# Patient Record
Sex: Female | Born: 1950
Health system: Southern US, Community
[De-identification: ages and names within clinical notes are randomized; demographics above are authoritative.]

## PROBLEM LIST (undated history)

## (undated) DIAGNOSIS — G43909 Migraine, unspecified, not intractable, without status migrainosus: Secondary | ICD-10-CM

## (undated) DIAGNOSIS — N2581 Secondary hyperparathyroidism of renal origin: Secondary | ICD-10-CM

## (undated) DIAGNOSIS — E785 Hyperlipidemia, unspecified: Secondary | ICD-10-CM

## (undated) DIAGNOSIS — J301 Allergic rhinitis due to pollen: Secondary | ICD-10-CM

## (undated) DIAGNOSIS — I639 Cerebral infarction, unspecified: Secondary | ICD-10-CM

## (undated) DIAGNOSIS — K580 Irritable bowel syndrome with diarrhea: Secondary | ICD-10-CM

## (undated) DIAGNOSIS — Q2112 Patent foramen ovale: Secondary | ICD-10-CM

## (undated) DIAGNOSIS — E1149 Type 2 diabetes mellitus with other diabetic neurological complication: Secondary | ICD-10-CM

## (undated) DIAGNOSIS — D509 Iron deficiency anemia, unspecified: Secondary | ICD-10-CM

## (undated) DIAGNOSIS — M797 Fibromyalgia: Secondary | ICD-10-CM

## (undated) DIAGNOSIS — K219 Gastro-esophageal reflux disease without esophagitis: Secondary | ICD-10-CM

## (undated) DIAGNOSIS — F32A Depression, unspecified: Secondary | ICD-10-CM

## (undated) DIAGNOSIS — N189 Chronic kidney disease, unspecified: Secondary | ICD-10-CM

## (undated) DIAGNOSIS — G2581 Restless legs syndrome: Secondary | ICD-10-CM

## (undated) DIAGNOSIS — H4720 Unspecified optic atrophy: Secondary | ICD-10-CM

## (undated) DIAGNOSIS — K589 Irritable bowel syndrome without diarrhea: Secondary | ICD-10-CM

## (undated) DIAGNOSIS — F39 Unspecified mood [affective] disorder: Secondary | ICD-10-CM

## (undated) DIAGNOSIS — H3530 Unspecified macular degeneration: Secondary | ICD-10-CM

## (undated) DIAGNOSIS — I10 Essential (primary) hypertension: Secondary | ICD-10-CM

## (undated) DIAGNOSIS — I69351 Hemiplegia and hemiparesis following cerebral infarction affecting right dominant side: Secondary | ICD-10-CM

## (undated) DIAGNOSIS — M1990 Unspecified osteoarthritis, unspecified site: Secondary | ICD-10-CM

## (undated) DIAGNOSIS — G479 Sleep disorder, unspecified: Secondary | ICD-10-CM

## (undated) DIAGNOSIS — K449 Diaphragmatic hernia without obstruction or gangrene: Secondary | ICD-10-CM

## (undated) DIAGNOSIS — H25019 Cortical age-related cataract, unspecified eye: Secondary | ICD-10-CM

## (undated) DIAGNOSIS — Q211 Atrial septal defect: Secondary | ICD-10-CM

## (undated) DIAGNOSIS — J189 Pneumonia, unspecified organism: Secondary | ICD-10-CM

## (undated) DIAGNOSIS — G4733 Obstructive sleep apnea (adult) (pediatric): Secondary | ICD-10-CM

## (undated) DIAGNOSIS — N186 End stage renal disease: Secondary | ICD-10-CM

## (undated) DIAGNOSIS — K635 Polyp of colon: Secondary | ICD-10-CM

## (undated) DIAGNOSIS — E538 Deficiency of other specified B group vitamins: Secondary | ICD-10-CM

## (undated) DIAGNOSIS — M810 Age-related osteoporosis without current pathological fracture: Secondary | ICD-10-CM

## (undated) HISTORY — PX: TUBAL LIGATION: SHX77

## (undated) HISTORY — DX: Sleep disorder, unspecified: G47.9

## (undated) HISTORY — DX: Allergic rhinitis due to pollen: J30.1

## (undated) HISTORY — DX: Migraine, unspecified, not intractable, without status migrainosus: G43.909

## (undated) HISTORY — DX: Deficiency of other specified B group vitamins: E53.8

## (undated) HISTORY — PX: CHOLECYSTECTOMY: SHX55

## (undated) HISTORY — DX: Unspecified mood (affective) disorder: F39

## (undated) HISTORY — PX: TONSILLECTOMY: SUR1361

## (undated) HISTORY — DX: Irritable bowel syndrome, unspecified: K58.9

## (undated) HISTORY — PX: COLONOSCOPY: SHX174

## (undated) HISTORY — DX: Type 2 diabetes mellitus with other diabetic neurological complication: E11.49

## (undated) HISTORY — DX: Hemiplegia and hemiparesis following cerebral infarction affecting right dominant side: I69.351

## (undated) HISTORY — DX: Patent foramen ovale: Q21.12

## (undated) HISTORY — DX: Restless legs syndrome: G25.81

## (undated) HISTORY — DX: Gastro-esophageal reflux disease without esophagitis: K21.9

## (undated) HISTORY — PX: HERNIA REPAIR: SHX51

## (undated) HISTORY — PX: CATARACT EXTRACTION W/ INTRAOCULAR LENS  IMPLANT, BILATERAL: SHX1307

## (undated) HISTORY — DX: Obstructive sleep apnea (adult) (pediatric): G47.33

## (undated) HISTORY — PX: UPPER GASTROINTESTINAL ENDOSCOPY: SHX188

## (undated) HISTORY — PX: TONSILLECTOMY AND ADENOIDECTOMY: SHX28

## (undated) HISTORY — DX: Fibromyalgia: M79.7

## (undated) HISTORY — PX: OTHER SURGICAL HISTORY: SHX169

## (undated) HISTORY — DX: Iron deficiency anemia, unspecified: D50.9

## (undated) HISTORY — DX: Unspecified optic atrophy: H47.20

## (undated) HISTORY — DX: Atrial septal defect: Q21.1

## (undated) HISTORY — DX: Essential (primary) hypertension: I10

---

## 1988-10-26 HISTORY — PX: HIATAL HERNIA REPAIR: SHX195

## 2004-08-07 ENCOUNTER — Ambulatory Visit: Payer: Self-pay | Admitting: Urology

## 2004-10-05 ENCOUNTER — Inpatient Hospital Stay: Payer: Self-pay | Admitting: Surgery

## 2004-10-05 ENCOUNTER — Other Ambulatory Visit: Payer: Self-pay

## 2004-10-14 ENCOUNTER — Ambulatory Visit: Payer: Self-pay | Admitting: Unknown Physician Specialty

## 2005-02-16 ENCOUNTER — Ambulatory Visit: Payer: Self-pay

## 2005-05-18 ENCOUNTER — Ambulatory Visit: Payer: Self-pay

## 2005-07-10 ENCOUNTER — Ambulatory Visit: Payer: Self-pay | Admitting: Urology

## 2006-09-27 ENCOUNTER — Encounter (INDEPENDENT_AMBULATORY_CARE_PROVIDER_SITE_OTHER): Payer: Self-pay | Admitting: Gynecology

## 2006-09-27 ENCOUNTER — Ambulatory Visit: Payer: Self-pay | Admitting: Gynecology

## 2007-11-07 ENCOUNTER — Ambulatory Visit: Payer: Self-pay | Admitting: Gynecology

## 2007-11-11 ENCOUNTER — Ambulatory Visit: Payer: Self-pay | Admitting: Urology

## 2007-11-14 ENCOUNTER — Encounter: Admission: RE | Admit: 2007-11-14 | Discharge: 2007-11-14 | Payer: Self-pay | Admitting: Gynecology

## 2007-12-13 ENCOUNTER — Ambulatory Visit: Payer: Self-pay | Admitting: Urology

## 2007-12-13 ENCOUNTER — Other Ambulatory Visit: Payer: Self-pay

## 2007-12-19 ENCOUNTER — Ambulatory Visit: Payer: Self-pay | Admitting: Urology

## 2008-05-18 ENCOUNTER — Ambulatory Visit: Payer: Self-pay | Admitting: Gastroenterology

## 2008-06-27 ENCOUNTER — Ambulatory Visit: Payer: Self-pay | Admitting: Gastroenterology

## 2008-08-14 ENCOUNTER — Encounter: Payer: Self-pay | Admitting: Gastroenterology

## 2008-08-21 ENCOUNTER — Encounter: Payer: Self-pay | Admitting: Gastroenterology

## 2008-08-21 DIAGNOSIS — R933 Abnormal findings on diagnostic imaging of other parts of digestive tract: Secondary | ICD-10-CM

## 2008-08-22 ENCOUNTER — Encounter: Payer: Self-pay | Admitting: Gastroenterology

## 2008-08-24 ENCOUNTER — Telehealth: Payer: Self-pay | Admitting: Internal Medicine

## 2008-08-27 ENCOUNTER — Telehealth: Payer: Self-pay | Admitting: Gastroenterology

## 2008-08-28 ENCOUNTER — Ambulatory Visit: Payer: Self-pay | Admitting: Gastroenterology

## 2008-09-03 ENCOUNTER — Ambulatory Visit: Payer: Self-pay | Admitting: Gastroenterology

## 2008-09-03 ENCOUNTER — Ambulatory Visit (HOSPITAL_COMMUNITY): Admission: RE | Admit: 2008-09-03 | Discharge: 2008-09-03 | Payer: Self-pay | Admitting: Gastroenterology

## 2008-09-04 ENCOUNTER — Telehealth: Payer: Self-pay | Admitting: Gastroenterology

## 2008-10-01 ENCOUNTER — Telehealth: Payer: Self-pay | Admitting: Gastroenterology

## 2008-10-04 ENCOUNTER — Telehealth: Payer: Self-pay | Admitting: Gastroenterology

## 2008-10-05 ENCOUNTER — Encounter: Payer: Self-pay | Admitting: Gastroenterology

## 2008-10-05 ENCOUNTER — Telehealth: Payer: Self-pay | Admitting: Gastroenterology

## 2008-10-05 ENCOUNTER — Ambulatory Visit: Payer: Self-pay

## 2008-10-08 ENCOUNTER — Ambulatory Visit: Payer: Self-pay

## 2008-10-08 ENCOUNTER — Ambulatory Visit: Payer: Self-pay | Admitting: Radiology

## 2008-10-16 ENCOUNTER — Encounter (INDEPENDENT_AMBULATORY_CARE_PROVIDER_SITE_OTHER): Payer: Self-pay | Admitting: Neurology

## 2008-10-16 ENCOUNTER — Ambulatory Visit: Payer: Self-pay

## 2009-01-17 ENCOUNTER — Ambulatory Visit: Payer: Self-pay | Admitting: Surgery

## 2009-01-22 ENCOUNTER — Inpatient Hospital Stay: Payer: Self-pay | Admitting: Surgery

## 2009-02-06 ENCOUNTER — Ambulatory Visit: Payer: Self-pay

## 2009-02-07 ENCOUNTER — Observation Stay: Payer: Self-pay | Admitting: Surgery

## 2009-06-03 ENCOUNTER — Encounter: Admission: RE | Admit: 2009-06-03 | Discharge: 2009-06-03 | Payer: Self-pay | Admitting: Neurology

## 2009-08-13 ENCOUNTER — Ambulatory Visit (HOSPITAL_COMMUNITY): Admission: RE | Admit: 2009-08-13 | Discharge: 2009-08-13 | Payer: Self-pay | Admitting: Neurology

## 2010-04-18 ENCOUNTER — Ambulatory Visit: Payer: Self-pay

## 2011-03-10 NOTE — Assessment & Plan Note (Signed)
NAME:  Anita Greene, Anita Greene NO.:  [id number]   MEDICAL RECORD NO.:  [id number]          PATIENT TYPE:  POB   LOCATION:  [place] [facility] [place]:  [facility]   PHYSICIAN:  Emily Filbert, MD        DATE OF BIRTH:  06/23/1951   DATE OF SERVICE:                                  CLINIC NOTE   IDENTIFICATION:  Anita Greene is a 60 year old married white female who  comes in for her annual exam. She has no GYN complaints today. She is  requesting refills on her medical doctor problems and pills for  sleeping.   PAST MEDICAL HISTORY:  Morbid obesity, hematuria, IBS, hypertension,  adult onset diabetes, reflux, anxiety/depression and mixed urinary  incontinence.   REVIEW OF SYSTEMS:  She sees Dr. Eliberto Ivory, a urologist in [place] and has  a cystoscopy scheduled November 22, 2007 as well as a CT scan for follow-  up of a adrenal mass.  She is married for 3 years to her second  husband. Again Pap smear was negative in 09/2006. Her mammogram is due  and her colonoscopy was done  38-year  of age.   PAST SURGICAL HISTORY:  Tubal ligation, cholecystectomy, bilateral  cataracts and tonsillectomy.   She has not latex  allergies.   ALLERGIES:  ERYTHROMYCIN, SPECTRACEPT AND BIAXIN.   SOCIAL HISTORY:  She reports rare alcohol.  Denies tobacco or illegal  drug use.   FAMILY HISTORY:  Significant for breast cancer in a paternal aunt,  bladder cancer and prostate cancer in her father, lung cancer in her  mother and sister but no GYN or colon malignancies.   PHYSICAL EXAM:  Weight 200 pounds, blood pressure 147/101, pulse 98.  HEENT:  Normal.  HEART:  Regular rate rhythm.  LUNGS:  Clear to auscultation bilaterally.  BREAST EXAM: No masses.  Normal skin and no nipple discharge.  ABDOMEN:  Benign and obese.  EXTERNAL GENITALIA:  Normal.  Mild atrophy.  Cervix: No lesions.  Bimanual exam:  Normal size and shape, anteverted uterus, nonenlarged  adnexa.   ASSESSMENT/PLAN:  1.  Annual exam.  Recommended weight loss and I checked a Pap smear and      recommended self-breast exam and self vulvar exams monthly.  2. With regard to her medicines that she needs refilled, I suggested      that the doctor that takes care of  these conditions refill those      prescriptions as well as sleeping pills.      Emily Filbert, MD     MCD/MEDQ  D:  11/07/2007  T:  11/08/2007  Job:  [id number]

## 2011-04-26 ENCOUNTER — Ambulatory Visit: Payer: Self-pay | Admitting: Internal Medicine

## 2011-05-20 ENCOUNTER — Ambulatory Visit: Payer: Self-pay | Admitting: Internal Medicine

## 2011-05-27 ENCOUNTER — Ambulatory Visit: Payer: Self-pay | Admitting: Internal Medicine

## 2011-06-02 ENCOUNTER — Ambulatory Visit: Payer: Self-pay

## 2011-06-27 ENCOUNTER — Ambulatory Visit: Payer: Self-pay | Admitting: Internal Medicine

## 2011-07-14 ENCOUNTER — Ambulatory Visit: Payer: Self-pay | Admitting: Gastroenterology

## 2011-07-27 ENCOUNTER — Ambulatory Visit: Payer: Self-pay | Admitting: Internal Medicine

## 2011-07-27 LAB — HM COLONOSCOPY

## 2011-07-29 ENCOUNTER — Ambulatory Visit: Payer: Self-pay | Admitting: Gastroenterology

## 2011-08-26 LAB — CA 125: CA 125: 9.4 U/mL (ref 0.0–34.0)

## 2011-08-27 ENCOUNTER — Ambulatory Visit: Payer: Self-pay | Admitting: Internal Medicine

## 2011-08-31 ENCOUNTER — Ambulatory Visit: Payer: Self-pay | Admitting: Unknown Physician Specialty

## 2011-09-26 ENCOUNTER — Ambulatory Visit: Payer: Self-pay | Admitting: Internal Medicine

## 2011-10-27 ENCOUNTER — Ambulatory Visit: Payer: Self-pay | Admitting: Internal Medicine

## 2011-12-29 ENCOUNTER — Ambulatory Visit: Payer: Self-pay | Admitting: Internal Medicine

## 2011-12-31 ENCOUNTER — Ambulatory Visit: Payer: Self-pay | Admitting: Gynecologic Oncology

## 2012-01-25 ENCOUNTER — Ambulatory Visit: Payer: Self-pay | Admitting: Internal Medicine

## 2012-02-24 ENCOUNTER — Ambulatory Visit: Payer: Self-pay | Admitting: Internal Medicine

## 2012-07-13 ENCOUNTER — Ambulatory Visit: Payer: Self-pay

## 2012-07-19 LAB — HEMOGLOBIN A1C: Hemoglobin A1C: 7.1

## 2012-07-19 LAB — BASIC METABOLIC PANEL: GLUCOSE: 137 mg/dL

## 2012-07-19 LAB — LIPID PANEL
CHOLESTEROL: 152 mg/dL (ref 0–200)
HDL: 51 mg/dL (ref 35–70)
LDL Cholesterol: 71 mg/dL

## 2013-07-28 ENCOUNTER — Ambulatory Visit: Payer: Self-pay | Admitting: Internal Medicine

## 2013-08-21 LAB — CANCER CENTER HEMOGLOBIN: HGB: 10.3 g/dL — ABNORMAL LOW (ref 12.0–16.0)

## 2013-08-26 ENCOUNTER — Ambulatory Visit: Payer: Self-pay | Admitting: Internal Medicine

## 2014-02-06 ENCOUNTER — Ambulatory Visit: Payer: Self-pay | Admitting: Internal Medicine

## 2014-02-23 ENCOUNTER — Ambulatory Visit: Payer: Self-pay | Admitting: Internal Medicine

## 2014-02-26 LAB — OCCULT BLOOD X 1 CARD TO LAB, STOOL
OCCULT BLOOD, FECES: NEGATIVE
Occult Blood, Feces: NEGATIVE
Occult Blood, Feces: NEGATIVE

## 2014-03-26 ENCOUNTER — Ambulatory Visit: Payer: Self-pay | Admitting: Oncology

## 2014-03-26 ENCOUNTER — Ambulatory Visit: Payer: Self-pay | Admitting: Internal Medicine

## 2014-09-18 ENCOUNTER — Ambulatory Visit: Payer: Self-pay | Admitting: Gastroenterology

## 2014-09-18 DIAGNOSIS — K580 Irritable bowel syndrome with diarrhea: Secondary | ICD-10-CM | POA: Insufficient documentation

## 2014-09-18 LAB — CLOSTRIDIUM DIFFICILE(ARMC)

## 2014-09-20 LAB — STOOL CULTURE

## 2016-01-01 ENCOUNTER — Encounter: Payer: Self-pay | Admitting: Internal Medicine

## 2016-01-01 ENCOUNTER — Ambulatory Visit (INDEPENDENT_AMBULATORY_CARE_PROVIDER_SITE_OTHER): Payer: BLUE CROSS/BLUE SHIELD | Admitting: Internal Medicine

## 2016-01-01 ENCOUNTER — Telehealth: Payer: Self-pay

## 2016-01-01 VITALS — BP 128/80 | HR 96 | Temp 98.4°F | Ht 62.0 in | Wt 185.5 lb

## 2016-01-01 DIAGNOSIS — I69351 Hemiplegia and hemiparesis following cerebral infarction affecting right dominant side: Secondary | ICD-10-CM | POA: Diagnosis not present

## 2016-01-01 DIAGNOSIS — K219 Gastro-esophageal reflux disease without esophagitis: Secondary | ICD-10-CM | POA: Insufficient documentation

## 2016-01-01 DIAGNOSIS — F39 Unspecified mood [affective] disorder: Secondary | ICD-10-CM

## 2016-01-01 DIAGNOSIS — G43909 Migraine, unspecified, not intractable, without status migrainosus: Secondary | ICD-10-CM | POA: Insufficient documentation

## 2016-01-01 DIAGNOSIS — G479 Sleep disorder, unspecified: Secondary | ICD-10-CM | POA: Insufficient documentation

## 2016-01-01 DIAGNOSIS — E1149 Type 2 diabetes mellitus with other diabetic neurological complication: Secondary | ICD-10-CM

## 2016-01-01 DIAGNOSIS — G4733 Obstructive sleep apnea (adult) (pediatric): Secondary | ICD-10-CM | POA: Insufficient documentation

## 2016-01-01 DIAGNOSIS — M797 Fibromyalgia: Secondary | ICD-10-CM | POA: Diagnosis not present

## 2016-01-01 DIAGNOSIS — K589 Irritable bowel syndrome without diarrhea: Secondary | ICD-10-CM | POA: Insufficient documentation

## 2016-01-01 DIAGNOSIS — Z23 Encounter for immunization: Secondary | ICD-10-CM | POA: Diagnosis not present

## 2016-01-01 LAB — COMPREHENSIVE METABOLIC PANEL
ALBUMIN: 4.2 g/dL (ref 3.5–5.2)
ALK PHOS: 90 U/L (ref 39–117)
ALT: 15 U/L (ref 0–35)
AST: 15 U/L (ref 0–37)
BUN: 20 mg/dL (ref 6–23)
CALCIUM: 10 mg/dL (ref 8.4–10.5)
CO2: 29 mEq/L (ref 19–32)
Chloride: 100 mEq/L (ref 96–112)
Creatinine, Ser: 1.09 mg/dL (ref 0.40–1.20)
GFR: 53.63 mL/min — AB (ref >60.00)
Glucose, Bld: 99 mg/dL (ref 70–99)
POTASSIUM: 5.2 meq/L — AB (ref 3.5–5.1)
Sodium: 137 mEq/L (ref 135–145)
TOTAL PROTEIN: 6.8 g/dL (ref 6.0–8.3)
Total Bilirubin: 0.3 mg/dL (ref 0.2–1.2)

## 2016-01-01 LAB — CBC WITH DIFFERENTIAL/PLATELET
Basophils Absolute: 0 10*3/uL (ref 0.0–0.1)
Basophils Relative: 0.4 % (ref 0.0–3.0)
EOS PCT: 2.3 % (ref 0.0–5.0)
Eosinophils Absolute: 0.2 10*3/uL (ref 0.0–0.7)
HCT: 37.4 % (ref 36.0–46.0)
HEMOGLOBIN: 12.1 g/dL (ref 12.0–15.0)
LYMPHS PCT: 33.1 % (ref 12.0–46.0)
Lymphs Abs: 3.5 10*3/uL (ref 0.7–4.0)
MCHC: 32.4 g/dL (ref 30.0–36.0)
MCV: 83.2 fl (ref 78.0–100.0)
MONO ABS: 0.4 10*3/uL (ref 0.1–1.0)
MONOS PCT: 4.2 % (ref 3.0–12.0)
Neutro Abs: 6.3 10*3/uL (ref 1.4–7.7)
Neutrophils Relative %: 60 % (ref 43.0–77.0)
Platelets: 391 10*3/uL (ref 150.0–400.0)
RBC: 4.49 Mil/uL (ref 3.87–5.11)
RDW: 16.5 % — ABNORMAL HIGH (ref 11.5–15.5)
WBC: 10.4 10*3/uL (ref 4.0–10.5)

## 2016-01-01 LAB — LIPID PANEL
CHOLESTEROL: 170 mg/dL (ref 0–200)
HDL: 60.2 mg/dL (ref >39.00)
LDL Cholesterol: 71 mg/dL (ref 0–99)
NONHDL: 109.46
Total CHOL/HDL Ratio: 3
Triglycerides: 194 mg/dL — ABNORMAL HIGH (ref 0.0–149.0)
VLDL: 38.8 mg/dL (ref 0.0–40.0)

## 2016-01-01 LAB — HEMOGLOBIN A1C: HEMOGLOBIN A1C: 6.7 % — AB (ref 4.6–6.5)

## 2016-01-01 LAB — HM DIABETES FOOT EXAM

## 2016-01-01 LAB — T4, FREE: FREE T4: 0.78 ng/dL (ref 0.60–1.60)

## 2016-01-01 MED ORDER — ZOLPIDEM TARTRATE 10 MG PO TABS: 10.0000 mg | ORAL_TABLET | Freq: Every evening | ORAL | Status: DC | PRN

## 2016-01-01 NOTE — Assessment & Plan Note (Signed)
Doing fine with occasional alprazolam

## 2016-01-01 NOTE — Addendum Note (Signed)
Addended by: Pilar Grammes on: 01/01/2016 01:00 PM   Modules accepted: Orders

## 2016-01-01 NOTE — Progress Notes (Signed)
Subjective:    Patient ID: Anita Greene, female    DOB: [birth year], 65 y.o.   MRN: [id number]  HPI Here for transfer of care---Dr Hardin Negus retired Brought his records--but obviously I couldn't review them as yet Had been going to him for 47 years-- and worked with him for a while  Has diabetes since 1998 Tingling in hands and feet Not particularly compliant with diet No set exercise Weight is down 25# in the past 2 years--since husband died  Long standing fibromyalgia --10 years lyrica does help this Gabapentin in the past--but notices more "cramps" in various places since then Chronic sleep issues  Chronic iron deficiency  Did have iron infusions with Dr Tereso Newcomer not since 2015  HTN long standing Verapamil is as migraine prevention also  Left sided CVA in past Some right hand tremor--no other deficits Never on statin  Chronic mood issues Anxiety and depression  Lost son at 13--- cancer Multiple other losses and divorce Uses alprazolam prn only paxil in the past--able to get off it  No current outpatient prescriptions on file prior to visit.   No current facility-administered medications on file prior to visit.    Allergies  Allergen Reactions  . Cefditoren Pivoxil     Other reaction(s): Other (See Comments) GI issues, severe abdominal pain  . Clarithromycin     Other reaction(s): Other (See Comments) GI issues  . Codeine Sulfate Other (See Comments)  . Erythromycin Other (See Comments)  . Other Other (See Comments)    Oral iron  . Sulfa Antibiotics Itching    Past Medical History  Diagnosis Date  . Type 2 diabetes mellitus with neurological manifestations, controlled ([place])   . Iron deficiency anemia   . Fibromyalgia   . Hypertension   . Hemiparesis affecting right side as late effect of cerebrovascular accident (CVA) ([place])   . PFO (patent foramen ovale)   . RLS (restless legs syndrome)   . Migraine headache   . Optic atrophy of right eye   .  IBS (irritable bowel syndrome)     diarrhea prone  . Allergic rhinitis due to pollen   . Mood disorder ([place])   . Obstructive sleep apnea     CPAP 11 (auto titrate)  . Sleep disturbance   . B12 deficiency     Past Surgical History  Procedure Laterality Date  . Nissen fundoplication    . Uroplasty  ~2000's    Dr Bernardo Heater  . Cholecystectomy    . Cataract extraction w/ intraocular lens  implant, bilateral    . Tubal ligation      Family History  Problem Relation Age of Onset  . Cancer Mother     lung cancer  . Parkinson's disease Father   . Dementia Father   . Cancer Sister     lung cancer  . Cancer Son     Ewing's sarcoma    Social History   Social History  . Marital Status: Widowed    Spouse Name: N/A  . Number of Children: 1  . Years of Education: N/A   Occupational History  . 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 Smoking status: Never Smoker   . Smokeless tobacco: Not on file  . Alcohol Use: Not on file  . Drug Use: Not on file  . Sexual Activity: Not on file   Other Topics Concern  . Not on file   Social History Narrative   1 son died  1 son living--2 grandchildren   Review of Systems  Constitutional: Negative for fatigue.       Wears seat belt  HENT: Positive for hearing loss. Negative for dental problem and tinnitus.        Keeps up with dentist Mild TMJ on right  Eyes: Negative for visual disturbance.       Last eye exam last year--- due soon.  Respiratory: Negative for chest tightness.        Slight cough from allergies  Cardiovascular: Negative for chest pain and leg swelling.       Palpitations with stress   Gastrointestinal: Positive for diarrhea. Negative for blood in stool.       Diarrhea prone IBS  Endocrine: Negative for polydipsia and polyuria.  Genitourinary: Negative for dysuria, hematuria and dyspareunia.       Urge incontinence  Musculoskeletal: Positive for back pain and arthralgias.       Some back and right  shoulder pain  Skin: Negative for rash.       1 spot on foot she wants checked  Allergic/Immunologic: Positive for environmental allergies. Negative for immunocompromised state.       Cetirizine works   Neurological: Positive for numbness. Negative for syncope.       Dizzy when standing up quick Right arm weakness---?from stroke  Hematological: Negative for adenopathy. Bruises/bleeds easily.  Psychiatric/Behavioral: Positive for sleep disturbance and dysphoric mood. The patient is nervous/anxious.        Objective:   Physical Exam  Constitutional: She is oriented to person, place, and time. She appears well-developed and well-nourished. No distress.  HENT:  Mouth/Throat: Oropharynx is clear and moist. No oropharyngeal exudate.  Neck: Normal range of motion. Neck supple. No thyromegaly present.  Cardiovascular: Normal rate, regular rhythm, normal heart sounds and intact distal pulses.  Exam reveals no gallop.   No murmur heard. Pulmonary/Chest: Effort normal and breath sounds normal. No respiratory distress. She has no wheezes. She has no rales.  Abdominal: Soft. There is no tenderness.  Musculoskeletal: She exhibits no edema or tenderness.  Lymphadenopathy:    She has no cervical adenopathy.  Neurological: She is alert and oriented to person, place, and time.  Decreased sensation in feet and to some extent hands No focal weakness  Skin:  No foot lesions   Psychiatric: She has a normal mood and affect. Her behavior is normal.          Assessment & Plan:

## 2016-01-01 NOTE — Assessment & Plan Note (Signed)
Has mild weakness if tired ?slight apraxia and tremor

## 2016-01-01 NOTE — Assessment & Plan Note (Signed)
Will check labs Seems to be okay

## 2016-01-01 NOTE — Assessment & Plan Note (Addendum)
Doing okay with lyrica Asked her to start cutting zolpidem in half Rarely uses tramadol or hydrocodone

## 2016-01-01 NOTE — Telephone Encounter (Signed)
Per Dr Silvio Pate, I have left her refill of zolpidem tartrate on the voice mail at [facility].  I spoke to Berry at [facility] to verify the dosages on her Fioricet and Voltaren Gel. He informed me she has never had it filled there. She has only been using them a few months. I called the patient back to verify and she said she does not know which pharmacy she may have had them filled at nor does she remember the actual dosage of the Fioricet or how many grams of Voltaren she was getting. She thinks it was the lower does of Fioricet without codeine. WHat would you like me to do about the refills?  She is also requesting a refill on lomotil.

## 2016-01-01 NOTE — Progress Notes (Signed)
Pre visit review using our clinic review tool, if applicable. No additional management support is needed unless otherwise documented below in the visit note. 

## 2016-01-01 NOTE — Addendum Note (Signed)
Addended by: Pilar Grammes on: 01/01/2016 03:26 PM   Modules accepted: Orders

## 2016-01-02 MED ORDER — DICLOFENAC SODIUM 1 % TD GEL: 2.0000 g | Freq: Four times a day (QID) | TRANSDERMAL | Status: DC | PRN

## 2016-01-02 MED ORDER — BUTALBITAL-APAP-CAFFEINE 50-500-40 MG PO TABS: 1.0000 | ORAL_TABLET | Freq: Three times a day (TID) | ORAL | Status: DC | PRN

## 2016-01-02 MED ORDER — DIPHENOXYLATE-ATROPINE 2.5-0.025 MG PO TABS: 1.0000 | ORAL_TABLET | Freq: Four times a day (QID) | ORAL | Status: DC | PRN

## 2016-01-02 NOTE — Telephone Encounter (Signed)
Left refills on voicemail at the pharmacy

## 2016-01-02 NOTE — Telephone Encounter (Signed)
Do you want to refill the Lomotil, also?

## 2016-01-02 NOTE — Telephone Encounter (Signed)
Use the dose of fioricet listed---- #30 x 0 voltaren gel as listed now--- dispense 1 container with 3 refills (check with pharmacist about size)

## 2016-01-02 NOTE — Telephone Encounter (Signed)
Okay #30 x 0 

## 2016-01-30 ENCOUNTER — Other Ambulatory Visit: Payer: Self-pay

## 2016-01-30 MED ORDER — TRAMADOL HCL 50 MG PO TABS: 50.0000 mg | ORAL_TABLET | Freq: Four times a day (QID) | ORAL | Status: DC | PRN

## 2016-01-30 MED ORDER — CYCLOBENZAPRINE HCL 10 MG PO TABS: 10.0000 mg | ORAL_TABLET | Freq: Three times a day (TID) | ORAL | Status: DC | PRN

## 2016-01-30 MED ORDER — ZOLPIDEM TARTRATE 10 MG PO TABS: 10.0000 mg | ORAL_TABLET | Freq: Every evening | ORAL | Status: DC | PRN

## 2016-01-30 NOTE — Telephone Encounter (Signed)
Cyclobenzaprine sent electronically Tramadol and Zolpidem left on voice mail for pharmacy

## 2016-01-30 NOTE — Telephone Encounter (Signed)
Approved: okay for all for 1 month x 0  Same quantities

## 2016-01-30 NOTE — Telephone Encounter (Signed)
New Patient Appt 01-01-16. Next OV 06-19-16 Tramadol last filled at [facility] 11-22-15 #60 Zolpidem last filled at [facility] 01-01-16 #30 Cyclobenzaprine last filled at [facility] 11-07-15 #90

## 2016-02-24 ENCOUNTER — Other Ambulatory Visit: Payer: Self-pay | Admitting: Internal Medicine

## 2016-02-24 MED ORDER — DICLOFENAC EPOLAMINE 1.3 % TD PTCH: 1.0000 | MEDICATED_PATCH | Freq: Two times a day (BID) | TRANSDERMAL | Status: DC

## 2016-02-24 NOTE — Telephone Encounter (Signed)
Zolpidem and Lyrica called in on voice mail at pharmacy. Verapamil went electronically

## 2016-02-24 NOTE — Telephone Encounter (Signed)
Zolpidem tartrate last filled 01-30-16 #30 Lyrica last filled 01-29-16 #60 New Patient Appt 01-01-16. Next OV 06-19-16

## 2016-02-24 NOTE — Telephone Encounter (Signed)
Approved: zolpidem #30 x 0 lyrica for 6 months

## 2016-02-24 NOTE — Telephone Encounter (Signed)
Okay to fill for 6 months

## 2016-02-24 NOTE — Telephone Encounter (Signed)
[place] left v/m requesting refill on flector patches; do not see that Dr Silvio Pate has filled before. Pt established care on 01/01/16.Please advise.

## 2016-02-26 ENCOUNTER — Other Ambulatory Visit: Payer: Self-pay

## 2016-02-26 MED ORDER — METFORMIN HCL 500 MG PO TABS: 500.0000 mg | ORAL_TABLET | Freq: Two times a day (BID) | ORAL | Status: DC

## 2016-02-26 NOTE — Telephone Encounter (Signed)
Rx sent electronically.  

## 2016-03-06 ENCOUNTER — Other Ambulatory Visit: Payer: Self-pay | Admitting: Internal Medicine

## 2016-03-06 NOTE — Telephone Encounter (Signed)
Approved: #60 x 0 

## 2016-03-06 NOTE — Telephone Encounter (Signed)
Left refill on voice mail at pharmacy  

## 2016-03-06 NOTE — Telephone Encounter (Signed)
Last filled 01-30-16 #60 Last OV 01-01-16 Next OV 06-19-16

## 2016-03-17 ENCOUNTER — Telehealth: Payer: Self-pay

## 2016-03-17 NOTE — Telephone Encounter (Signed)
I am not comfortable prescribing this medication as I never have before. I have used other meds for IBS--but not this one If she really needs it, I may prefer she see a GI doctor

## 2016-03-17 NOTE — Telephone Encounter (Signed)
Left detailed message on home phone per DPR with message from Dr Silvio Pate

## 2016-03-17 NOTE — Telephone Encounter (Signed)
Received a fax from [employer] for Viberzi 100mg  twice a day  #60. This was not on her medication list so I called the pharmacy. Spoke to [employer], [employer]. He verified she had been getting monthly, but ran out of Dr Software engineer Rx. Do you want to provide refills? It is for IBS

## 2016-03-20 ENCOUNTER — Other Ambulatory Visit: Payer: Self-pay | Admitting: Internal Medicine

## 2016-03-20 NOTE — Telephone Encounter (Signed)
Last filled 02-24-16 #30.

## 2016-03-20 NOTE — Telephone Encounter (Signed)
Last OV 01-01-16 Next OV 06-19-16

## 2016-03-21 NOTE — Telephone Encounter (Signed)
Approved: 30 x 0 

## 2016-03-24 ENCOUNTER — Other Ambulatory Visit: Payer: Self-pay

## 2016-03-24 MED ORDER — PANTOPRAZOLE SODIUM 40 MG PO TBEC: 40.0000 mg | DELAYED_RELEASE_TABLET | Freq: Every day | ORAL | Status: DC

## 2016-03-24 NOTE — Telephone Encounter (Signed)
Rx sent electronically.  

## 2016-03-24 NOTE — Telephone Encounter (Signed)
Left refill on voice mail at pharmacy  

## 2016-03-25 ENCOUNTER — Telehealth: Payer: Self-pay

## 2016-03-25 NOTE — Telephone Encounter (Signed)
Form filled out and faxed to [employer].

## 2016-04-20 ENCOUNTER — Other Ambulatory Visit: Payer: Self-pay | Admitting: Internal Medicine

## 2016-04-21 NOTE — Telephone Encounter (Signed)
Left refill on voice mail at pharmacy  

## 2016-04-21 NOTE — Telephone Encounter (Signed)
Zolpidem last filled 03-24-16 #30 Lomotil last filled 01-02-16 #30 Last OV 01-01-16 Next OV 06-19-16

## 2016-04-21 NOTE — Telephone Encounter (Signed)
Approved: Okay #30 x 0 for each

## 2016-04-23 ENCOUNTER — Other Ambulatory Visit: Payer: Self-pay

## 2016-04-23 MED ORDER — LISINOPRIL-HYDROCHLOROTHIAZIDE 20-25 MG PO TABS: 1.0000 | ORAL_TABLET | Freq: Every day | ORAL | Status: DC

## 2016-04-23 NOTE — Telephone Encounter (Signed)
Rx sent electronically.  

## 2016-05-18 ENCOUNTER — Other Ambulatory Visit: Payer: Self-pay | Admitting: Internal Medicine

## 2016-05-20 ENCOUNTER — Other Ambulatory Visit: Payer: Self-pay | Admitting: *Deleted

## 2016-05-20 MED ORDER — ZOLPIDEM TARTRATE 10 MG PO TABS: 10.0000 mg | ORAL_TABLET | Freq: Every evening | ORAL | 0 refills | Status: DC | PRN

## 2016-05-20 NOTE — Telephone Encounter (Signed)
Ok to refill in Dr. Alla German absence?

## 2016-05-21 NOTE — Telephone Encounter (Signed)
Rx called in as directed.   

## 2016-06-04 ENCOUNTER — Telehealth: Payer: Self-pay | Admitting: Internal Medicine

## 2016-06-04 NOTE — Telephone Encounter (Signed)
Please let her know that she can schedule her own screening mammogram. She should try to get her former ones and take them with her--or have them sent over. This may prevent her needing to be called back

## 2016-06-04 NOTE — Telephone Encounter (Signed)
Patient called and said she'd like to have her Mammogram scheduled at [facility].  Patient had her last Mammograms done at [facility].  Patient said to schedule appointment and if it's not a good time for her, she'll call and reschedule the appointment.

## 2016-06-04 NOTE — Telephone Encounter (Signed)
Spoke to pt. She will take care of it

## 2016-06-05 LAB — HM DIABETES EYE EXAM

## 2016-06-12 ENCOUNTER — Encounter: Payer: Self-pay | Admitting: Internal Medicine

## 2016-06-15 ENCOUNTER — Other Ambulatory Visit: Payer: Self-pay | Admitting: Internal Medicine

## 2016-06-15 DIAGNOSIS — Z1231 Encounter for screening mammogram for malignant neoplasm of breast: Secondary | ICD-10-CM

## 2016-06-18 ENCOUNTER — Other Ambulatory Visit: Payer: Self-pay | Admitting: Internal Medicine

## 2016-06-18 ENCOUNTER — Ambulatory Visit
Admission: RE | Admit: 2016-06-18 | Discharge: 2016-06-18 | Disposition: A | Discharge status: Home / Self Care | Payer: BLUE CROSS/BLUE SHIELD | Source: Ambulatory Visit | Attending: Internal Medicine | Admitting: Internal Medicine

## 2016-06-18 DIAGNOSIS — Z1231 Encounter for screening mammogram for malignant neoplasm of breast: Secondary | ICD-10-CM

## 2016-06-19 ENCOUNTER — Encounter: Payer: Self-pay | Admitting: Internal Medicine

## 2016-06-19 ENCOUNTER — Telehealth: Payer: Self-pay

## 2016-06-19 ENCOUNTER — Ambulatory Visit (INDEPENDENT_AMBULATORY_CARE_PROVIDER_SITE_OTHER): Payer: BLUE CROSS/BLUE SHIELD | Admitting: Internal Medicine

## 2016-06-19 VITALS — BP 110/70 | HR 79 | Temp 98.0°F | Ht 62.0 in | Wt 186.2 lb

## 2016-06-19 DIAGNOSIS — E1149 Type 2 diabetes mellitus with other diabetic neurological complication: Secondary | ICD-10-CM

## 2016-06-19 DIAGNOSIS — N3000 Acute cystitis without hematuria: Secondary | ICD-10-CM | POA: Diagnosis not present

## 2016-06-19 DIAGNOSIS — F39 Unspecified mood [affective] disorder: Secondary | ICD-10-CM

## 2016-06-19 DIAGNOSIS — Z0000 Encounter for general adult medical examination without abnormal findings: Secondary | ICD-10-CM | POA: Diagnosis not present

## 2016-06-19 DIAGNOSIS — M797 Fibromyalgia: Secondary | ICD-10-CM

## 2016-06-19 DIAGNOSIS — I69351 Hemiplegia and hemiparesis following cerebral infarction affecting right dominant side: Secondary | ICD-10-CM

## 2016-06-19 LAB — POC URINALSYSI DIPSTICK (AUTOMATED)
Bilirubin, UA: NEGATIVE
Glucose, UA: NEGATIVE
Ketones, UA: NEGATIVE
Nitrite, UA: NEGATIVE
Protein, UA: NEGATIVE
Spec Grav, UA: >=1.03
Urobilinogen, UA: 4
pH, UA: 5.5

## 2016-06-19 LAB — HEMOGLOBIN A1C: Hgb A1c MFr Bld: 6.6 % — ABNORMAL HIGH (ref 4.6–6.5)

## 2016-06-19 MED ORDER — VERAPAMIL HCL 80 MG PO TABS: 80.0000 mg | ORAL_TABLET | Freq: Two times a day (BID) | ORAL | 3 refills | Status: DC

## 2016-06-19 MED ORDER — LISINOPRIL-HYDROCHLOROTHIAZIDE 20-25 MG PO TABS: 1.0000 | ORAL_TABLET | Freq: Every day | ORAL | 3 refills | Status: DC

## 2016-06-19 MED ORDER — DIPHENOXYLATE-ATROPINE 2.5-0.025 MG PO TABS: ORAL_TABLET | ORAL | 0 refills | Status: DC

## 2016-06-19 MED ORDER — CETIRIZINE HCL 10 MG PO TABS: 10.0000 mg | ORAL_TABLET | Freq: Every day | ORAL | 3 refills | Status: AC

## 2016-06-19 MED ORDER — PANTOPRAZOLE SODIUM 40 MG PO TBEC: 40.0000 mg | DELAYED_RELEASE_TABLET | Freq: Every day | ORAL | 3 refills | Status: DC

## 2016-06-19 MED ORDER — METFORMIN HCL 500 MG PO TABS: 500.0000 mg | ORAL_TABLET | Freq: Two times a day (BID) | ORAL | 3 refills | Status: DC

## 2016-06-19 MED ORDER — DICLOFENAC EPOLAMINE 1.3 % TD PTCH: 1.0000 | MEDICATED_PATCH | Freq: Two times a day (BID) | TRANSDERMAL | 1 refills | Status: DC

## 2016-06-19 MED ORDER — PREGABALIN 100 MG PO CAPS: ORAL_CAPSULE | ORAL | 5 refills | Status: DC

## 2016-06-19 MED ORDER — ZOLPIDEM TARTRATE 10 MG PO TABS: 10.0000 mg | ORAL_TABLET | Freq: Every evening | ORAL | 0 refills | Status: DC | PRN

## 2016-06-19 MED ORDER — CYCLOBENZAPRINE HCL 10 MG PO TABS: 10.0000 mg | ORAL_TABLET | Freq: Three times a day (TID) | ORAL | 0 refills | Status: DC | PRN

## 2016-06-19 MED ORDER — HYDROCODONE-ACETAMINOPHEN 5-325 MG PO TABS: 1.0000 | ORAL_TABLET | Freq: Two times a day (BID) | ORAL | 0 refills | Status: DC | PRN

## 2016-06-19 MED ORDER — TRAMADOL HCL 50 MG PO TABS: 50.0000 mg | ORAL_TABLET | Freq: Four times a day (QID) | ORAL | 0 refills | Status: DC | PRN

## 2016-06-19 MED ORDER — GLUCOSE BLOOD VI STRP: ORAL_STRIP | 3 refills | Status: DC

## 2016-06-19 MED ORDER — MELOXICAM 15 MG PO TABS: 15.0000 mg | ORAL_TABLET | Freq: Every day | ORAL | 3 refills | Status: DC

## 2016-06-19 MED ORDER — DICLOFENAC SODIUM 1 % TD GEL: TRANSDERMAL | 1 refills | Status: DC

## 2016-06-19 MED ORDER — ALPRAZOLAM 0.5 MG PO TABS: 0.5000 mg | ORAL_TABLET | Freq: Two times a day (BID) | ORAL | 0 refills | Status: DC | PRN

## 2016-06-19 MED ORDER — HYOSCYAMINE SULFATE ER 0.375 MG PO TBCR: 1.0000 | EXTENDED_RELEASE_TABLET | Freq: Every day | ORAL | 5 refills | Status: DC

## 2016-06-19 MED ORDER — SILVER SULFADIAZINE 1 % EX CREA: 1.0000 "application " | TOPICAL_CREAM | Freq: Every day | CUTANEOUS | 1 refills | Status: DC

## 2016-06-19 MED ORDER — CIPROFLOXACIN HCL 250 MG PO TABS: 250.0000 mg | ORAL_TABLET | Freq: Two times a day (BID) | ORAL | 0 refills | Status: DC

## 2016-06-19 NOTE — Assessment & Plan Note (Signed)
Controlled Will reassess after insurance change

## 2016-06-19 NOTE — Addendum Note (Signed)
Addended by: Pilar Grammes on: 06/19/2016 08:47 AM   Modules accepted: Orders

## 2016-06-19 NOTE — Telephone Encounter (Signed)
Left detailed message on VM per DPR. 

## 2016-06-19 NOTE — Assessment & Plan Note (Signed)
Symptoms are some better with hydration Does have some leuks Will give cipro in case increased symptoms

## 2016-06-19 NOTE — Progress Notes (Signed)
Pre visit review using our clinic review tool, if applicable. No additional management support is needed unless otherwise documented below in the visit note. 

## 2016-06-19 NOTE — Assessment & Plan Note (Signed)
Just had mammo yesterday Colon due ?2019 Will need 1 more pap--next visit Flu vaccine coming up Discussed exercise

## 2016-06-19 NOTE — Addendum Note (Signed)
Addended by: Pilar Grammes on: 06/19/2016 08:28 AM   Modules accepted: Orders

## 2016-06-19 NOTE — Assessment & Plan Note (Signed)
Seems well controlled Will check A1c No major pain in feet--on lyrica for now (may have to stop when changes insurance)

## 2016-06-19 NOTE — Addendum Note (Signed)
Addended by: Viviana Simpler I on: 06/19/2016 08:41 AM   Modules accepted: Orders

## 2016-06-19 NOTE — Telephone Encounter (Signed)
I discussed this with her She will get prevnar at the welcome to medicare visit I will give pneumovax booster in 2019 Flu vaccine should be in September

## 2016-06-19 NOTE — Progress Notes (Signed)
Subjective:    Patient ID: Anita Greene, female    DOB: 11/15/1950, 65 y.o.   MRN: [id number]  HPI Here for physical  Concerned about UTI Had some spasm during void yesterday Not much urine Increased fluids and took cranberry No hematuria or distinct dysuria  Recent eye exam Very dry eyes--using systane Right eye with discharge in AM recently No redness  Checks sugars every other day or so Usually 120 or so No hypoglycemic reactions  Depressed month last time Husband's diagnosis made in July Car recently stolen  Current Outpatient Prescriptions on File Prior to Visit  Medication Sig Dispense Refill  . ALPRAZolam (XANAX) 0.5 MG tablet Take 0.5 mg by mouth 2 (two) times daily as needed.     Marland Kitchen aspirin EC 81 MG tablet Take 81 mg by mouth daily.     Marland Kitchen azelastine (ASTELIN) 0.1 % nasal spray Place into both nostrils 2 (two) times daily. Use in each nostril as directed    . butalbital-acetaminophen-caffeine (ESGIC PLUS) 50-500-40 MG tablet Take 1 tablet by mouth 3 (three) times daily as needed for pain. 30 tablet 0  . cetirizine (ZYRTEC) 10 MG tablet Take 10 mg by mouth daily.     . cyclobenzaprine (FLEXERIL) 10 MG tablet Take 1 tablet (10 mg total) by mouth 3 (three) times daily as needed. 90 tablet 0  . diclofenac (FLECTOR) 1.3 % PTCH Place 1 patch onto the skin 2 (two) times daily. 60 patch 5  . diclofenac sodium (VOLTAREN) 1 % GEL APPLY 2 GRAMS TO SHOULDER UP TO 4 TIMES A DAY AS NEEDED 100 g 0  . diphenoxylate-atropine (LOMOTIL) 2.5-0.025 MG tablet TAKE 1 TABLET BY MOUTH 4 TIMES DAILY AS NEEDED FOR DIARRHEA OR LOOSE STOOL 30 tablet 0  . fluticasone (FLONASE) 50 MCG/ACT nasal spray Place 2 sprays into both nostrils daily.    Marland Kitchen gentamicin ointment (GARAMYCIN) 0.1 % Apply 1 application topically as needed.    . Glucose Blood (ACCU-CHEK COMPACT TEST DRUM VI) by In Vitro route.    Marland Kitchen HYDROcodone-acetaminophen (NORCO/VICODIN) 5-325 MG tablet Take 1 tablet by mouth 2 (two) times  daily as needed.     Marland Kitchen lisinopril-hydrochlorothiazide (PRINZIDE,ZESTORETIC) 20-25 MG tablet Take 1 tablet by mouth daily. 90 tablet 0  . loperamide (IMODIUM A-D) 2 MG tablet Take 2 mg by mouth as needed for diarrhea or loose stools.    Marland Kitchen LYRICA 100 MG capsule TAKE 1 CAPSULE BY MOUTH TWICE A DAY AS NEEDED 60 capsule 5  . meloxicam (MOBIC) 15 MG tablet Take 15 mg by mouth daily.     . metFORMIN (GLUCOPHAGE) 500 MG tablet Take 1 tablet (500 mg total) by mouth 2 (two) times daily with a meal. 60 tablet 4  . nystatin-triamcinolone (MYCOLOG II) cream Apply 1 application topically 2 (two) times daily.    . pantoprazole (PROTONIX) 40 MG tablet Take 1 tablet (40 mg total) by mouth daily. 90 tablet 0  . traMADol (ULTRAM) 50 MG tablet TAKE 1 TABLET BY MOUTH EVERY 6 HOURS AS NEEDED 60 tablet 0  . verapamil (CALAN) 80 MG tablet TAKE 1 TABLET BY MOUTH TWICE A DAY 180 tablet 1  . zolpidem (AMBIEN) 10 MG tablet Take 1 tablet (10 mg total) by mouth at bedtime as needed. for sleep 30 tablet 0   No current facility-administered medications on file prior to visit.     Allergies  Allergen Reactions  . Cefditoren Pivoxil     Other reaction(s): Other (See Comments) GI  issues, severe abdominal pain  . Clarithromycin     Other reaction(s): Other (See Comments) GI issues  . Codeine Sulfate Other (See Comments)  . Erythromycin Other (See Comments)  . Other Other (See Comments)    Oral iron  . Sulfa Antibiotics Itching    Past Medical History:  Diagnosis Date  . Allergic rhinitis due to pollen   . B12 deficiency   . Fibromyalgia   . GERD (gastroesophageal reflux disease)   . Hemiparesis affecting right side as late effect of cerebrovascular accident (CVA) ([place])   . Hypertension   . IBS (irritable bowel syndrome)    diarrhea prone  . Iron deficiency anemia   . Migraine headache   . Mood disorder ([place])   . Obstructive sleep apnea    CPAP 11 (auto titrate)  . Optic atrophy of right eye   . PFO (patent  foramen ovale)   . RLS (restless legs syndrome)   . Sleep disturbance   . Type 2 diabetes mellitus with neurological manifestations, controlled [facility])     Past Surgical History:  Procedure Laterality Date  . CATARACT EXTRACTION W/ INTRAOCULAR LENS  IMPLANT, BILATERAL    . CHOLECYSTECTOMY    . NISSEN FUNDOPLICATION    . TUBAL LIGATION    . Uroplasty  ~2000's   Dr Bernardo Heater    Family History  Problem Relation Age of Onset  . Cancer Mother     lung cancer  . Parkinson's disease Father   . Dementia Father   . Cancer Sister     lung cancer  . Cancer Son     Ewing's sarcoma    Social History   Social History  . Marital status: Widowed    Spouse name: N/A  . Number of children: 1  . Years of education: N/A   Occupational History  . 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 Smoking status: Never Smoker  . Smokeless tobacco: Not on file  . Alcohol use Not on file  . Drug use: Unknown  . Sexual activity: Not on file   Other Topics Concern  . Not on file   Social History Narrative   1 son died    1 son living--2 grandchildren     Review of Systems  Constitutional: Negative for fatigue and unexpected weight change.       Wears seat belt  HENT: Positive for hearing loss. Negative for tinnitus.        Keeps up with dentist--recent crowns  Eyes: Positive for discharge. Negative for redness.  Respiratory: Negative for cough, chest tightness and shortness of breath.   Cardiovascular: Negative for chest pain and leg swelling.       Palpitations rare--with anxiety  Gastrointestinal: Positive for diarrhea. Negative for blood in stool and nausea.       IBS--mild pain is fecal warning--hyoscamine helps  Endocrine: Negative for polydipsia and polyuria.  Genitourinary: Negative for difficulty urinating.       No sex--no problem. Discussed safe sex  Musculoskeletal: Positive for back pain and myalgias.  Skin: Negative for rash.       No suspicious lesions    Allergic/Immunologic: Positive for environmental allergies. Negative for immunocompromised state.       Exposed to several fungi at work--tested   Neurological: Negative for dizziness, syncope and light-headedness.       Same sensory changes in legs. Mild right side weakness when fatigued  Hematological: Negative for adenopathy. Bruises/bleeds easily.  Psychiatric/Behavioral:  Positive for dysphoric mood and sleep disturbance. The patient is nervous/anxious.        Still uses ambien---holds twice a week when not due for work Ongoing stress       Objective:   Physical Exam  Constitutional: She is oriented to person, place, and time. She appears well-developed and well-nourished. No distress.  HENT:  Head: Normocephalic and atraumatic.  Right Ear: External ear normal.  Left Ear: External ear normal.  Mouth/Throat: Oropharynx is clear and moist. No oropharyngeal exudate.  Eyes: Conjunctivae are normal. Pupils are equal, round, and reactive to light.  Neck: Normal range of motion. Neck supple. No thyromegaly present.  Cardiovascular: Normal rate, regular rhythm, normal heart sounds and intact distal pulses.  Exam reveals no gallop.   No murmur heard. Pulmonary/Chest: Effort normal and breath sounds normal. No respiratory distress. She has no wheezes. She has no rales.  Abdominal: Soft. There is no tenderness.  Musculoskeletal: She exhibits no edema or tenderness.  Lymphadenopathy:    She has no cervical adenopathy.  Neurological: She is alert and oriented to person, place, and time.  Decreased sensation in feet  Skin: No rash noted. No erythema.  No foot lesions  Psychiatric: She has a normal mood and affect. Her behavior is normal.          Assessment & Plan:

## 2016-06-19 NOTE — Assessment & Plan Note (Signed)
Mild weakness on right when tired

## 2016-06-19 NOTE — Telephone Encounter (Signed)
Pt wants to know when she should get flu shot and also wants to know about getting pneumonia shot; pt last got pneumovax 07/20/2011; do you want pt to get pneumovax when she gets flu shot or prevnar 13. Pt request cb.

## 2016-06-19 NOTE — Assessment & Plan Note (Signed)
Tough month last month Uses the alprazolam prn Discussed limiting dose of zolpidem also

## 2016-06-20 ENCOUNTER — Encounter: Payer: Self-pay | Admitting: Internal Medicine

## 2016-06-20 NOTE — Telephone Encounter (Signed)
I thought you were going to give 90 days for everything---I even said the zolpidem would be okay  Please attend to this on Monday

## 2016-06-22 MED ORDER — ALPRAZOLAM 0.5 MG PO TABS: 0.5000 mg | ORAL_TABLET | Freq: Two times a day (BID) | ORAL | 0 refills | Status: DC | PRN

## 2016-06-22 MED ORDER — TRAMADOL HCL 50 MG PO TABS: 50.0000 mg | ORAL_TABLET | Freq: Four times a day (QID) | ORAL | 0 refills | Status: DC | PRN

## 2016-06-22 MED ORDER — HYDROCODONE-ACETAMINOPHEN 5-325 MG PO TABS: 1.0000 | ORAL_TABLET | Freq: Two times a day (BID) | ORAL | 0 refills | Status: DC | PRN

## 2016-06-22 MED ORDER — ZOLPIDEM TARTRATE 10 MG PO TABS: 10.0000 mg | ORAL_TABLET | Freq: Every evening | ORAL | 0 refills | Status: DC | PRN

## 2016-06-22 MED ORDER — PREGABALIN 100 MG PO CAPS: ORAL_CAPSULE | ORAL | 0 refills | Status: DC

## 2016-06-22 MED ORDER — DIPHENOXYLATE-ATROPINE 2.5-0.025 MG PO TABS: ORAL_TABLET | ORAL | 0 refills | Status: DC

## 2016-07-01 ENCOUNTER — Other Ambulatory Visit: Payer: Self-pay | Admitting: Internal Medicine

## 2016-07-06 ENCOUNTER — Ambulatory Visit: Payer: BLUE CROSS/BLUE SHIELD

## 2016-07-31 ENCOUNTER — Encounter: Payer: Self-pay | Admitting: Family Medicine

## 2016-07-31 ENCOUNTER — Ambulatory Visit (INDEPENDENT_AMBULATORY_CARE_PROVIDER_SITE_OTHER): Payer: BLUE CROSS/BLUE SHIELD | Admitting: Family Medicine

## 2016-07-31 ENCOUNTER — Ambulatory Visit: Payer: BLUE CROSS/BLUE SHIELD | Admitting: Internal Medicine

## 2016-07-31 VITALS — BP 120/80 | HR 83 | Wt 187.8 lb

## 2016-07-31 DIAGNOSIS — S80812A Abrasion, left lower leg, initial encounter: Secondary | ICD-10-CM | POA: Diagnosis not present

## 2016-07-31 DIAGNOSIS — S0181XA Laceration without foreign body of other part of head, initial encounter: Secondary | ICD-10-CM

## 2016-07-31 NOTE — Progress Notes (Signed)
   Subjective:    Patient ID: Anita Greene, female    DOB: [birth year], 65 y.o.   MRN: [id number]  HPI This is a 65 yo female who presents today following a fall 4 days ago. She was letting her dogs out and tripped over one of them. She fell onto her face on the gravel. She had a laceration of her forehead and applied a butterfly. She also had an abrasion to her left shin that is very sore. No loc, no headache, no visual changes. She was afraid to remove the butterfly closure and restart bleeding.    Past Medical History:  Diagnosis Date  . Allergic rhinitis due to pollen   . B12 deficiency   . Fibromyalgia   . GERD (gastroesophageal reflux disease)   . Hemiparesis affecting right side as late effect of cerebrovascular accident (CVA) ([place])   . Hypertension   . IBS (irritable bowel syndrome)    diarrhea prone  . Iron deficiency anemia   . Migraine headache   . Mood disorder ([place])   . Obstructive sleep apnea    CPAP 11 (auto titrate)  . Optic atrophy of right eye   . PFO (patent foramen ovale)   . RLS (restless legs syndrome)   . Sleep disturbance   . Type 2 diabetes mellitus with neurological manifestations, controlled [facility])    Past Surgical History:  Procedure Laterality Date  . CATARACT EXTRACTION W/ INTRAOCULAR LENS  IMPLANT, BILATERAL    . CHOLECYSTECTOMY    . NISSEN FUNDOPLICATION    . TUBAL LIGATION    . Uroplasty  ~2000's   Dr Bernardo Heater   Family History  Problem Relation Age of Onset  . Cancer Mother     lung cancer  . Parkinson's disease Father   . Dementia Father   . COPD Father   . Heart disease Father   . Cancer Sister     lung cancer  . Cancer Son     Ewing's sarcoma      Review of Systems Per HPI    Objective:   Physical Exam Physical Exam  Vitals reviewed. Constitutional: Oriented to person, place, and time. Appears well-developed and well-nourished.  HENT:  Head: Normocephalic. 1 cm healing area above eyes, edges well approximated, small  amount scabbing.   Eyes: Conjunctivae are normal.  Neck: Normal range of motion. Neck supple.  Cardiovascular: Normal rate.   Pulmonary/Chest: Effort normal.  Musculoskeletal: Normal range of motion.  Neurological: Alert and oriented to person, place, and time.  Skin: Skin is warm and dry. Left anterior lower leg with 2 cm abrasion, no erythema or drainage.  Psychiatric: Normal mood and affect. Behavior is normal. Judgment and thought content normal.      BP 120/80   Pulse 83   Wt 187 lb 12.8 oz (85.2 kg)   SpO2 95%   BMI 34.35 kg/m  Wt Readings from Last 3 Encounters:  07/31/16 187 lb 12.8 oz (85.2 kg)  06/19/16 186 lb 4 oz (84.5 kg)  01/01/16 185 lb 8 oz (84.1 kg)       Assessment & Plan:  1. Laceration of forehead, initial encounter - no signs/symptoms of infection - wound care reviewed  2. Abrasion of left lower leg, initial encounter - no signs/symtpoms of infection - wound care reviewed - open to air when possible   Clarene Reamer, FNP-BC  [facility], [facility]  08/01/2016 7:10 AM

## 2016-07-31 NOTE — Patient Instructions (Signed)
Can try to take a different antihistamine instead of cetirizine- Allegra or Claritin (generic is fine) Keep your wounds clean and dry and uncovered when you can

## 2016-08-20 ENCOUNTER — Encounter: Payer: Self-pay | Admitting: Internal Medicine

## 2016-08-20 MED ORDER — ALPRAZOLAM 0.5 MG PO TABS: 0.5000 mg | ORAL_TABLET | Freq: Two times a day (BID) | ORAL | 0 refills | Status: DC | PRN

## 2016-08-20 NOTE — Telephone Encounter (Signed)
Please call in.  Thanks.   

## 2016-08-21 MED ORDER — ALPRAZOLAM 0.5 MG PO TABS: 0.5000 mg | ORAL_TABLET | Freq: Two times a day (BID) | ORAL | 0 refills | Status: DC | PRN

## 2016-08-21 NOTE — Telephone Encounter (Signed)
Left refill on voice mail at pharmacy  

## 2016-08-25 ENCOUNTER — Encounter: Payer: Self-pay | Admitting: Internal Medicine

## 2016-08-26 ENCOUNTER — Other Ambulatory Visit: Payer: Self-pay

## 2016-08-26 MED ORDER — ZOLPIDEM TARTRATE 10 MG PO TABS: 10.0000 mg | ORAL_TABLET | Freq: Every evening | ORAL | 0 refills | Status: DC | PRN

## 2016-08-26 NOTE — Telephone Encounter (Signed)
Please call in.  Thanks.   

## 2016-08-26 NOTE — Telephone Encounter (Signed)
Last refill 06/22/16 #60, last OV 06/19/16. Ok to refill?

## 2016-08-26 NOTE — Telephone Encounter (Signed)
Rx called to pharmacy as instructed. 

## 2016-08-27 ENCOUNTER — Telehealth: Payer: Self-pay

## 2016-08-27 MED ORDER — METFORMIN HCL ER 500 MG PO TB24: 1000.0000 mg | ORAL_TABLET | Freq: Every day | ORAL | 3 refills | Status: DC

## 2016-08-27 MED ORDER — METFORMIN HCL ER 500 MG PO TB24: 500.0000 mg | ORAL_TABLET | Freq: Every day | ORAL | 3 refills | Status: DC

## 2016-08-27 NOTE — Telephone Encounter (Signed)
Gerald Stabs (pharmacist) with [facility] calls for clarification on the Metformin ER 500mg  script that was sent in.  She has been taking immediate release 500mg  bid and wanted to see if the 500mg  ER form was supposed to be for 2 pills qam instead of 1 pill qd.  I did verify in the notes from [facility] and Dr. Silvio Pate on 08/25/16 that dosing should be 2 pills in the am or patient could try 1 pill bid if she wants but definitely 2 pills daily.  R/X clarified with pharmacy and corrected by phone verbal approval.

## 2016-08-27 NOTE — Telephone Encounter (Signed)
Let her know that yes, the ER version of metformin is often easier on the intestines. Okay to change her to this and send 1 year. Please log in her flu shot

## 2016-09-01 ENCOUNTER — Telehealth: Payer: Self-pay

## 2016-09-01 NOTE — Telephone Encounter (Signed)
Received letter for Zolpidem Tartrate 10mg  PA. Called and spoke to Norman at [facility]. Answered questionnaire over the phone.  Case #[id number]

## 2016-09-04 NOTE — Telephone Encounter (Signed)
Response received stating 90 in 365 days is approved. More quantity will require further approval. Will take care of that as needed

## 2016-09-28 ENCOUNTER — Encounter: Payer: Self-pay | Admitting: Internal Medicine

## 2016-09-28 ENCOUNTER — Ambulatory Visit (INDEPENDENT_AMBULATORY_CARE_PROVIDER_SITE_OTHER): Payer: PPO | Admitting: Internal Medicine

## 2016-09-28 VITALS — BP 120/70 | HR 88 | Temp 98.3°F | Ht 62.0 in | Wt 189.0 lb

## 2016-09-28 DIAGNOSIS — R1011 Right upper quadrant pain: Secondary | ICD-10-CM | POA: Diagnosis not present

## 2016-09-28 NOTE — Progress Notes (Signed)
Pre visit review using our clinic review tool, if applicable. No additional management support is needed unless otherwise documented below in the visit note. 

## 2016-09-28 NOTE — Progress Notes (Signed)
Subjective:    Patient ID: Anita Greene, female    DOB: Jul 08, 1951, 65 y.o.   MRN: [id number]  HPI Here due to RUQ pain  Started several days ago--and is intermittent Fairly constant but ebbs up and down Not aware in sleep Not affected by eating or movement  No N/V Appetite is still fine Bowels continue to be loose--with diarrhea predominant (less obvious with metformin ER)  No obvious injury or heavy work recently  Current Outpatient Prescriptions on File Prior to Visit  Medication Sig Dispense Refill  . ALPRAZolam (XANAX) 0.5 MG tablet Take 1 tablet (0.5 mg total) by mouth 2 (two) times daily as needed. 60 tablet 0  . aspirin EC 81 MG tablet Take 81 mg by mouth daily.     . cetirizine (ZYRTEC) 10 MG tablet Take 1 tablet (10 mg total) by mouth daily. 90 tablet 3  . cyclobenzaprine (FLEXERIL) 10 MG tablet Take 1 tablet (10 mg total) by mouth 3 (three) times daily as needed. 90 tablet 0  . diclofenac sodium (VOLTAREN) 1 % GEL APPLY 2 GRAMS TO SHOULDER UP TO 4 TIMES A DAY AS NEEDED 300 g 1  . diphenoxylate-atropine (LOMOTIL) 2.5-0.025 MG tablet TAKE 1 TABLET BY MOUTH 4 TIMES DAILY AS NEEDED FOR DIARRHEA OR LOOSE STOOL 60 tablet 0  . fluticasone (FLONASE) 50 MCG/ACT nasal spray Place 2 sprays into both nostrils daily.    Marland Kitchen gentamicin ointment (GARAMYCIN) 0.1 % Apply 1 application topically as needed.    Marland Kitchen glucose blood (ACCU-CHEK COMPACT TEST DRUM) test strip Check blood sugar once a day 100 each 3  . HYDROcodone-acetaminophen (NORCO/VICODIN) 5-325 MG tablet Take 1 tablet by mouth 2 (two) times daily as needed. 60 tablet 0  . Hyoscyamine Sulfate 0.375 MG TBCR Take 1 tablet (0.375 mg total) by mouth daily. 60 each 5  . lisinopril-hydrochlorothiazide (PRINZIDE,ZESTORETIC) 20-25 MG tablet Take 1 tablet by mouth daily. 90 tablet 3  . loperamide (IMODIUM A-D) 2 MG tablet Take 2 mg by mouth as needed for diarrhea or loose stools.    . meloxicam (MOBIC) 15 MG tablet Take 1 tablet (15 mg  total) by mouth daily. 90 tablet 3  . metFORMIN (GLUCOPHAGE-XR) 500 MG 24 hr tablet Take 2 tablets (1,000 mg total) by mouth daily with breakfast. 180 tablet 3  . nystatin-triamcinolone (MYCOLOG II) cream Apply 1 application topically 2 (two) times daily.    . pantoprazole (PROTONIX) 40 MG tablet Take 1 tablet (40 mg total) by mouth daily. 90 tablet 3  . silver sulfADIAZINE (SILVADENE) 1 % cream Apply 1 application topically daily. 50 g 1  . traMADol (ULTRAM) 50 MG tablet Take 1 tablet (50 mg total) by mouth every 6 (six) hours as needed. 120 tablet 0  . verapamil (CALAN) 80 MG tablet Take 1 tablet (80 mg total) by mouth 2 (two) times daily. 180 tablet 3  . zolpidem (AMBIEN) 10 MG tablet Take 1 tablet (10 mg total) by mouth at bedtime as needed. for sleep 60 tablet 0   No current facility-administered medications on file prior to visit.     Allergies  Allergen Reactions  . Cefditoren Pivoxil     Other reaction(s): Other (See Comments) GI issues, severe abdominal pain  . Clarithromycin     Other reaction(s): Other (See Comments) GI issues  . Codeine Sulfate Other (See Comments)  . Erythromycin Other (See Comments)  . Other Other (See Comments)    Oral iron  . Sulfa Antibiotics Itching  Past Medical History:  Diagnosis Date  . Allergic rhinitis due to pollen   . B12 deficiency   . Fibromyalgia   . GERD (gastroesophageal reflux disease)   . Hemiparesis affecting right side as late effect of cerebrovascular accident (CVA) ([place])   . Hypertension   . IBS (irritable bowel syndrome)    diarrhea prone  . Iron deficiency anemia   . Migraine headache   . Mood disorder ([place])   . Obstructive sleep apnea    CPAP 11 (auto titrate)  . Optic atrophy of right eye   . PFO (patent foramen ovale)   . RLS (restless legs syndrome)   . Sleep disturbance   . Type 2 diabetes mellitus with neurological manifestations, controlled [facility])     Past Surgical History:  Procedure Laterality Date  .  CATARACT EXTRACTION W/ INTRAOCULAR LENS  IMPLANT, BILATERAL    . CHOLECYSTECTOMY    . NISSEN FUNDOPLICATION    . TUBAL LIGATION    . Uroplasty  ~2000's   Dr Bernardo Heater    Family History  Problem Relation Age of Onset  . Cancer Mother     lung cancer  . Parkinson's disease Father   . Dementia Father   . COPD Father   . Heart disease Father   . Cancer Sister     lung cancer  . Cancer Son     Ewing's sarcoma    Social History   Social History  . Marital status: Widowed    Spouse name: N/A  . Number of children: 1  . Years of education: N/A   Occupational History  . 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 Smoking status: Never Smoker  . Smokeless tobacco: Never Used  . Alcohol use Not on file  . Drug use: Unknown  . Sexual activity: Not on file   Other Topics Concern  . Not on file   Social History Narrative   1 son died    1 son living--2 grandchildren   Review of Systems No blood in stool Weight stable No cough or SOB Feels fine otherwise (other than chronic back pain)    Objective:   Physical Exam  Constitutional: She appears well-developed and well-nourished. No distress.  Pulmonary/Chest: Effort normal and breath sounds normal. No respiratory distress. She has no wheezes. She has no rales.  Abdominal:  Oval bruise over right lateral abdomen below RCM Tenderness mostly over this area           Assessment & Plan:

## 2016-09-28 NOTE — Assessment & Plan Note (Signed)
No GI symptoms and bruise on abdominal wall Pain with getting up from supine also Discussed apparent soft tissue injury Discussed heat and patience

## 2016-09-29 ENCOUNTER — Ambulatory Visit: Payer: PPO | Admitting: Internal Medicine

## 2016-10-14 DIAGNOSIS — S335XXA Sprain of ligaments of lumbar spine, initial encounter: Secondary | ICD-10-CM | POA: Diagnosis not present

## 2016-10-14 DIAGNOSIS — M9903 Segmental and somatic dysfunction of lumbar region: Secondary | ICD-10-CM | POA: Diagnosis not present

## 2016-10-14 DIAGNOSIS — M9901 Segmental and somatic dysfunction of cervical region: Secondary | ICD-10-CM | POA: Diagnosis not present

## 2016-10-14 DIAGNOSIS — M531 Cervicobrachial syndrome: Secondary | ICD-10-CM | POA: Diagnosis not present

## 2016-10-16 DIAGNOSIS — S335XXA Sprain of ligaments of lumbar spine, initial encounter: Secondary | ICD-10-CM | POA: Diagnosis not present

## 2016-10-16 DIAGNOSIS — M9903 Segmental and somatic dysfunction of lumbar region: Secondary | ICD-10-CM | POA: Diagnosis not present

## 2016-10-21 ENCOUNTER — Other Ambulatory Visit: Payer: Self-pay | Admitting: Internal Medicine

## 2016-10-21 NOTE — Telephone Encounter (Signed)
Alprazolam last filled 08-21-16 #60 Lyrica #120 was removed from the med list 09-28-16  Last OV 09-28-16 Next OV 12-15-16

## 2016-10-21 NOTE — Telephone Encounter (Signed)
Approved:no mention of the lyrica at the visit---okay to fill for 6 months if still on Alprazolam #60 x 0

## 2016-10-21 NOTE — Telephone Encounter (Signed)
Left refills on voice mail at pharmacy  

## 2016-11-19 ENCOUNTER — Other Ambulatory Visit: Payer: Self-pay | Admitting: Family Medicine

## 2016-11-19 ENCOUNTER — Other Ambulatory Visit: Payer: Self-pay | Admitting: Internal Medicine

## 2016-11-19 NOTE — Telephone Encounter (Signed)
Approved: #30 x 0 Let her know that the FDA has formally stated that women 76 or older should not receive a dose over 5mg  nightly. The pharmacist may speak to her about this. We should consider decreasing the dose at her next visit

## 2016-11-19 NOTE — Telephone Encounter (Signed)
Routing to PCP, pt has a CPE scheduled 12/15/16, last filled on 08/26/16 #60 tabs with 0 refill, please advise

## 2016-11-19 NOTE — Telephone Encounter (Signed)
Approved: #60 x 0 

## 2016-11-19 NOTE — Telephone Encounter (Signed)
Spoke to pt. She said she was aware of it. Trying not to take it as much. Left refill on voice mail at pharmacy

## 2016-11-19 NOTE — Telephone Encounter (Signed)
Last filled 09-26-16 #60 Last OV 09-28-16 Next OV 12-15-16

## 2016-11-19 NOTE — Telephone Encounter (Signed)
Left refill on voice mail at pharmacy  

## 2016-12-15 ENCOUNTER — Ambulatory Visit (INDEPENDENT_AMBULATORY_CARE_PROVIDER_SITE_OTHER): Payer: PPO | Admitting: Internal Medicine

## 2016-12-15 ENCOUNTER — Encounter: Payer: Self-pay | Admitting: Internal Medicine

## 2016-12-15 VITALS — BP 124/78 | HR 86 | Temp 98.2°F | Ht 62.0 in | Wt 190.0 lb

## 2016-12-15 DIAGNOSIS — Z0000 Encounter for general adult medical examination without abnormal findings: Secondary | ICD-10-CM

## 2016-12-15 DIAGNOSIS — Z23 Encounter for immunization: Secondary | ICD-10-CM

## 2016-12-15 DIAGNOSIS — M797 Fibromyalgia: Secondary | ICD-10-CM | POA: Diagnosis not present

## 2016-12-15 DIAGNOSIS — I69351 Hemiplegia and hemiparesis following cerebral infarction affecting right dominant side: Secondary | ICD-10-CM

## 2016-12-15 DIAGNOSIS — F39 Unspecified mood [affective] disorder: Secondary | ICD-10-CM

## 2016-12-15 DIAGNOSIS — G4733 Obstructive sleep apnea (adult) (pediatric): Secondary | ICD-10-CM

## 2016-12-15 DIAGNOSIS — E1149 Type 2 diabetes mellitus with other diabetic neurological complication: Secondary | ICD-10-CM

## 2016-12-15 DIAGNOSIS — Z7189 Other specified counseling: Secondary | ICD-10-CM | POA: Insufficient documentation

## 2016-12-15 LAB — CBC WITH DIFFERENTIAL/PLATELET
Basophils Absolute: 0 10*3/uL (ref 0.0–0.1)
Basophils Relative: 0.6 % (ref 0.0–3.0)
EOS ABS: 0.2 10*3/uL (ref 0.0–0.7)
EOS PCT: 2.3 % (ref 0.0–5.0)
HCT: 32 % — ABNORMAL LOW (ref 36.0–46.0)
HEMOGLOBIN: 10.3 g/dL — AB (ref 12.0–15.0)
LYMPHS ABS: 2.3 10*3/uL (ref 0.7–4.0)
Lymphocytes Relative: 29.7 % (ref 12.0–46.0)
MCHC: 32.2 g/dL (ref 30.0–36.0)
MCV: 84.3 fl (ref 78.0–100.0)
MONO ABS: 0.4 10*3/uL (ref 0.1–1.0)
Monocytes Relative: 5.2 % (ref 3.0–12.0)
NEUTROS PCT: 62.2 % (ref 43.0–77.0)
Neutro Abs: 4.9 10*3/uL (ref 1.4–7.7)
PLATELETS: 337 10*3/uL (ref 150.0–400.0)
RBC: 3.8 Mil/uL — ABNORMAL LOW (ref 3.87–5.11)
RDW: 16.7 % — AB (ref 11.5–15.5)
WBC: 7.8 10*3/uL (ref 4.0–10.5)

## 2016-12-15 LAB — COMPREHENSIVE METABOLIC PANEL
ALBUMIN: 4.1 g/dL (ref 3.5–5.2)
ALT: 22 U/L (ref 0–35)
AST: 24 U/L (ref 0–37)
Alkaline Phosphatase: 78 U/L (ref 39–117)
BUN: 24 mg/dL — AB (ref 6–23)
CHLORIDE: 109 meq/L (ref 96–112)
CO2: 24 mEq/L (ref 19–32)
CREATININE: 1.42 mg/dL — AB (ref 0.40–1.20)
Calcium: 9.1 mg/dL (ref 8.4–10.5)
GFR: 39.41 mL/min — ABNORMAL LOW (ref >60.00)
GLUCOSE: 105 mg/dL — AB (ref 70–99)
Potassium: 4.1 mEq/L (ref 3.5–5.1)
SODIUM: 140 meq/L (ref 135–145)
TOTAL PROTEIN: 6.6 g/dL (ref 6.0–8.3)
Total Bilirubin: 0.3 mg/dL (ref 0.2–1.2)

## 2016-12-15 LAB — LIPID PANEL
CHOLESTEROL: 145 mg/dL (ref 0–200)
HDL: 53.1 mg/dL (ref >39.00)
LDL Cholesterol: 63 mg/dL (ref 0–99)
NONHDL: 91.83
Total CHOL/HDL Ratio: 3
Triglycerides: 143 mg/dL (ref 0.0–149.0)
VLDL: 28.6 mg/dL (ref 0.0–40.0)

## 2016-12-15 LAB — T4, FREE: FREE T4: 0.73 ng/dL (ref 0.60–1.60)

## 2016-12-15 LAB — HEMOGLOBIN A1C: HEMOGLOBIN A1C: 6.5 % (ref 4.6–6.5)

## 2016-12-15 MED ORDER — GABAPENTIN 300 MG PO CAPS: 300.0000 mg | ORAL_CAPSULE | Freq: Two times a day (BID) | ORAL | 3 refills | Status: DC

## 2016-12-15 MED ORDER — GLUCOSE BLOOD VI STRP: ORAL_STRIP | 3 refills | Status: DC

## 2016-12-15 MED ORDER — HYDROCODONE-ACETAMINOPHEN 5-325 MG PO TABS: 1.0000 | ORAL_TABLET | Freq: Two times a day (BID) | ORAL | 0 refills | Status: DC | PRN

## 2016-12-15 MED ORDER — FREESTYLE LITE DEVI: 1.0000 | Freq: Once | 0 refills | Status: AC

## 2016-12-15 NOTE — Progress Notes (Signed)
Subjective:    Patient ID: Anita Greene, female    DOB: 1951-04-30, 66 y.o.   MRN: [id number]  HPI Here for initial Medicare preventative exam and follow up of chronic health conditions Reviewed form and advanced directives Reviewed other doctors No alcohol or tobacco 1 fall this year--no injury Vision is okay--recent visit [facility] Has hearing aides--uses them when in a crowd Occasional depressed mood--but not anhedonic Independent with instrumental ADLs No apparent cognitive problems  Ongoing problems with the fibromyalgia Can't afford lyrica on Medicare Wants to resume gabapentin (400mg  bid in past) and cymbalta Flexeril and meloxicam are only occasional  Low back pain from arthritis Uses hydrocodone rarely--- and voltaren gel as needed  Some mood issues still Anxiety mostly at night Grieves young son and husband at times Uses the alprazolam most nights and rarely will take 1/2 in day Has cut back the zolpidem to 5mg  at bedtime (most nights)  Still mild problems with right hand Might have CTS--but also will have some mild weakness and tremor in right hand Still has slight limp since stroke as well (very mild right leg involvement)  Chronic issues with IBS Uses the imodium when she goes out to eat Has dry mouth---discussed trying off the hyoscyamine  Checks sugars regularly Usually under 100 in AM No hypoglycemic reactions Bowels better with ER Metformin  She needs new CPAP machine Uses this every night--still finds it helpful Pressure is at 11  Has humidifier on it  Current Outpatient Prescriptions on File Prior to Visit  Medication Sig Dispense Refill  . ALPRAZolam (XANAX) 0.5 MG tablet TAKE 1 TABLET BY MOUTH TWICE (2) DAILY AS NEEDED 60 tablet 0  . aspirin EC 81 MG tablet Take 81 mg by mouth daily.     . cetirizine (ZYRTEC) 10 MG tablet Take 1 tablet (10 mg total) by mouth daily. 90 tablet 3  . cyclobenzaprine (FLEXERIL) 10 MG tablet Take 1 tablet  (10 mg total) by mouth 3 (three) times daily as needed. 90 tablet 0  . diclofenac sodium (VOLTAREN) 1 % GEL APPLY 2 GRAMS TO THE SHOULDER UP TO 4 TIMES DAILY AS NEEDED 300 g 5  . diphenoxylate-atropine (LOMOTIL) 2.5-0.025 MG tablet TAKE 1 TABLET BY MOUTH 4 TIMES DAILY AS NEEDED FOR DIARRHEA OR LOOSE STOOL 60 tablet 0  . fluticasone (FLONASE) 50 MCG/ACT nasal spray Place 2 sprays into both nostrils daily.    Marland Kitchen gentamicin ointment (GARAMYCIN) 0.1 % Apply 1 application topically as needed.    Marland Kitchen glucose blood (ACCU-CHEK COMPACT TEST DRUM) test strip Check blood sugar once a day 100 each 3  . HYDROcodone-acetaminophen (NORCO/VICODIN) 5-325 MG tablet Take 1 tablet by mouth 2 (two) times daily as needed. 60 tablet 0  . Hyoscyamine Sulfate 0.375 MG TBCR Take 1 tablet (0.375 mg total) by mouth daily. 60 each 5  . lisinopril-hydrochlorothiazide (PRINZIDE,ZESTORETIC) 20-25 MG tablet Take 1 tablet by mouth daily. 90 tablet 3  . loperamide (IMODIUM A-D) 2 MG tablet Take 2 mg by mouth as needed for diarrhea or loose stools.    Marland Kitchen LYRICA 100 MG capsule TAKE 1 CAPSULE BY MOUTH TWICE DAILY AS NEEDED 120 capsule 3  . meloxicam (MOBIC) 15 MG tablet Take 1 tablet (15 mg total) by mouth daily. 90 tablet 3  . metFORMIN (GLUCOPHAGE-XR) 500 MG 24 hr tablet Take 2 tablets (1,000 mg total) by mouth daily with breakfast. 180 tablet 3  . nystatin-triamcinolone (MYCOLOG II) cream Apply 1 application topically 2 (two)  times daily.    . pantoprazole (PROTONIX) 40 MG tablet Take 1 tablet (40 mg total) by mouth daily. 90 tablet 3  . silver sulfADIAZINE (SILVADENE) 1 % cream Apply 1 application topically daily. 50 g 1  . traMADol (ULTRAM) 50 MG tablet Take 1 tablet (50 mg total) by mouth every 6 (six) hours as needed. 120 tablet 0  . verapamil (CALAN) 80 MG tablet Take 1 tablet (80 mg total) by mouth 2 (two) times daily. 180 tablet 3  . zolpidem (AMBIEN) 10 MG tablet TAKE 1 TABLET BY MOUTH AT BEDTIME AS NEEDED FOR SLEEP 30 tablet 0     No current facility-administered medications on file prior to visit.     Allergies  Allergen Reactions  . Cefditoren Pivoxil     Other reaction(s): Other (See Comments) GI issues, severe abdominal pain  . Clarithromycin     Other reaction(s): Other (See Comments) GI issues  . Codeine Sulfate Other (See Comments)  . Erythromycin Other (See Comments)  . Other Other (See Comments)    Oral iron  . Sulfa Antibiotics Itching    Past Medical History:  Diagnosis Date  . Allergic rhinitis due to pollen   . B12 deficiency   . Fibromyalgia   . GERD (gastroesophageal reflux disease)   . Hemiparesis affecting right side as late effect of cerebrovascular accident (CVA) ([place])   . Hypertension   . IBS (irritable bowel syndrome)    diarrhea prone  . Iron deficiency anemia   . Migraine headache   . Mood disorder ([place])   . Obstructive sleep apnea    CPAP 11 (auto titrate)  . Optic atrophy of right eye   . PFO (patent foramen ovale)   . RLS (restless legs syndrome)   . Sleep disturbance   . Type 2 diabetes mellitus with neurological manifestations, controlled [facility])     Past Surgical History:  Procedure Laterality Date  . CATARACT EXTRACTION W/ INTRAOCULAR LENS  IMPLANT, BILATERAL    . CHOLECYSTECTOMY    . NISSEN FUNDOPLICATION    . TUBAL LIGATION    . Uroplasty  ~2000's   Dr Bernardo Heater    Family History  Problem Relation Age of Onset  . Cancer Mother     lung cancer  . Parkinson's disease Father   . Dementia Father   . COPD Father   . Heart disease Father   . Cancer Sister     lung cancer  . Cancer Son     Ewing's sarcoma    Social History   Social History  . Marital status: Widowed    Spouse name: N/A  . Number of children: 1  . Years of education: N/A   Occupational History  . 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 Smoking status: Never Smoker  . Smokeless tobacco: Never Used  . Alcohol use Not on file  . Drug use: Unknown  . Sexual activity:  Not on file   Other Topics Concern  . Not on file   Social History Narrative   1 son died    1 son living--2 grandchildren      Had living will--not sure about it   No formal health care POA--would want son   Would accept resuscitation   No prolonged tube feeds if cognitively unaware   Review of Systems Appetite is good Weight is up slightly Sleeps okay with meds Wears seat belt Teeth okay--- keeps up with dentist Altha Harm) Has mouth guard  for night Has some white spots on hands-- did have it checked at dermatologist (benign) in past Occasional knee pain--joints otherwise okay No heartburn lately-- rare dysphagia (mostly bread if she is not careful)    Objective:   Physical Exam  Constitutional: She is oriented to person, place, and time. She appears well-developed and well-nourished. No distress.  HENT:  Mouth/Throat: Oropharynx is clear and moist. No oropharyngeal exudate.  Neck: Normal range of motion. Neck supple. No thyromegaly present.  Cardiovascular: Normal rate, regular rhythm, normal heart sounds and intact distal pulses.  Exam reveals no gallop.   No murmur heard. Pulmonary/Chest: Effort normal and breath sounds normal. No respiratory distress. She has no wheezes. She has no rales.  Abdominal: Soft. There is no tenderness.  Musculoskeletal: She exhibits no edema or tenderness.  Lymphadenopathy:    She has no cervical adenopathy.  Neurological: She is alert and oriented to person, place, and time.  President--- "Dwaine Deter, Bush" [phone] [id number] Recall 3/3  Skin: No rash noted. No erythema.  No foot lesions  Psychiatric: She has a normal mood and affect. Her behavior is normal.          Assessment & Plan:

## 2016-12-15 NOTE — Assessment & Plan Note (Signed)
Very subtle changes in hand and leg Cholesterol levels are good -- on asa

## 2016-12-15 NOTE — Assessment & Plan Note (Signed)
Mostly anxiety Rare depressed mood Will reconsider duloxetine for the fibromyalgia mostly

## 2016-12-15 NOTE — Assessment & Plan Note (Signed)
See social history 

## 2016-12-15 NOTE — Addendum Note (Signed)
Addended by: Pilar Grammes on: 12/15/2016 02:43 PM   Modules accepted: Orders

## 2016-12-15 NOTE — Assessment & Plan Note (Signed)
Stop lyrica due to cost Restart gabapentin and consider restarting duloxetine

## 2016-12-15 NOTE — Addendum Note (Signed)
Addended by: Pilar Grammes on: 12/15/2016 02:46 PM   Modules accepted: Orders

## 2016-12-15 NOTE — Assessment & Plan Note (Signed)
Uses the machine every night but it is old Needs new machine

## 2016-12-15 NOTE — Assessment & Plan Note (Signed)
Mild neuropathy Seems to still have good control Will recheck labs

## 2016-12-15 NOTE — Assessment & Plan Note (Signed)
I have personally reviewed the Medicare Annual Wellness questionnaire and have noted 1. The patient's medical and social history 2. Their use of alcohol, tobacco or illicit drugs 3. Their current medications and supplements 4. The patient's functional ability including ADL's, fall risks, home safety risks and hearing or visual             impairment. 5. Diet and physical activities 6. Evidence for depression or mood disorders  The patients weight, height, BMI and visual acuity have been recorded in the chart I have made referrals, counseling and provided education to the patient based review of the above and I have provided the pt with a written personalized care plan for preventive services.  I have provided you with a copy of your personalized plan for preventive services. Please take the time to review along with your updated medication list.  Mammogram due 8/19 Colon due 2022 Will consider last pap next year--probably not Discussed fitness Normal DEXA 2009 EKG today prevnar given

## 2016-12-15 NOTE — Patient Instructions (Signed)
Restart the gabapentin then wean off the lyrica. Let me know if you think we need to increase the gabapentin or consider restarting duloxetine (cymbalta).

## 2016-12-15 NOTE — Progress Notes (Signed)
Pre visit review using our clinic review tool, if applicable. No additional management support is needed unless otherwise documented below in the visit note. 

## 2016-12-16 ENCOUNTER — Other Ambulatory Visit: Payer: Self-pay | Admitting: Internal Medicine

## 2016-12-16 ENCOUNTER — Encounter: Payer: Self-pay | Admitting: Internal Medicine

## 2016-12-16 DIAGNOSIS — D649 Anemia, unspecified: Secondary | ICD-10-CM

## 2016-12-16 DIAGNOSIS — N183 Chronic kidney disease, stage 3 unspecified: Secondary | ICD-10-CM

## 2016-12-16 NOTE — Telephone Encounter (Signed)
Please fax the prescription I wrote. Send my note from this week--I discussed the CPAP Her sleep study is in the media section Let her know when done

## 2016-12-18 ENCOUNTER — Telehealth: Payer: Self-pay | Admitting: *Deleted

## 2016-12-18 NOTE — Telephone Encounter (Signed)
Pt will be in on Mon to pick up Ifob, but she requested to leave a urine specimen as well when she comes in? She said she saw a urologist years ago that said that whenever she was anemic she would pass blood though her urine. She says she doesn't have any visible blood in urine, and urine isn't any darker. She just want's to have it checked just because she is anemic, and had abn kidney function labs.

## 2016-12-21 ENCOUNTER — Encounter: Payer: Self-pay | Admitting: Internal Medicine

## 2016-12-23 ENCOUNTER — Other Ambulatory Visit: Payer: Self-pay | Admitting: Internal Medicine

## 2016-12-23 ENCOUNTER — Other Ambulatory Visit (INDEPENDENT_AMBULATORY_CARE_PROVIDER_SITE_OTHER): Payer: PPO | Admitting: Internal Medicine

## 2016-12-23 ENCOUNTER — Other Ambulatory Visit: Payer: PPO

## 2016-12-23 DIAGNOSIS — N183 Chronic kidney disease, stage 3 unspecified: Secondary | ICD-10-CM

## 2016-12-23 LAB — URINALYSIS WITH CULTURE, IF INDICATED
BILIRUBIN URINE: NEGATIVE
Ketones, ur: NEGATIVE
NITRITE: NEGATIVE
PH: 5.5 (ref 5.0–8.0)
Specific Gravity, Urine: 1.025 (ref 1.000–1.030)
Total Protein, Urine: NEGATIVE
Urine Glucose: NEGATIVE
Urobilinogen, UA: 0.2 (ref 0.0–1.0)

## 2016-12-26 LAB — URINE CULTURE

## 2016-12-29 DIAGNOSIS — G4733 Obstructive sleep apnea (adult) (pediatric): Secondary | ICD-10-CM | POA: Diagnosis not present

## 2017-01-01 ENCOUNTER — Other Ambulatory Visit (INDEPENDENT_AMBULATORY_CARE_PROVIDER_SITE_OTHER): Payer: PPO

## 2017-01-01 DIAGNOSIS — N183 Chronic kidney disease, stage 3 unspecified: Secondary | ICD-10-CM

## 2017-01-01 DIAGNOSIS — N390 Urinary tract infection, site not specified: Secondary | ICD-10-CM | POA: Diagnosis not present

## 2017-01-01 LAB — RENAL FUNCTION PANEL
ALBUMIN: 4.1 g/dL (ref 3.5–5.2)
BUN: 24 mg/dL — AB (ref 6–23)
CALCIUM: 9.5 mg/dL (ref 8.4–10.5)
CO2: 23 meq/L (ref 19–32)
CREATININE: 1.38 mg/dL — AB (ref 0.40–1.20)
Chloride: 108 mEq/L (ref 96–112)
GFR: 40.72 mL/min — AB (ref >60.00)
GLUCOSE: 96 mg/dL (ref 70–99)
Phosphorus: 3.9 mg/dL (ref 2.3–4.6)
Potassium: 4.4 mEq/L (ref 3.5–5.1)
Sodium: 139 mEq/L (ref 135–145)

## 2017-01-04 ENCOUNTER — Encounter: Payer: Self-pay | Admitting: Internal Medicine

## 2017-01-04 DIAGNOSIS — N183 Chronic kidney disease, stage 3 unspecified: Secondary | ICD-10-CM

## 2017-01-04 LAB — URINE CULTURE

## 2017-01-07 ENCOUNTER — Other Ambulatory Visit (INDEPENDENT_AMBULATORY_CARE_PROVIDER_SITE_OTHER): Payer: PPO

## 2017-01-07 DIAGNOSIS — D649 Anemia, unspecified: Secondary | ICD-10-CM

## 2017-01-07 LAB — FECAL OCCULT BLOOD, IMMUNOCHEMICAL: FECAL OCCULT BLD: NEGATIVE

## 2017-01-11 ENCOUNTER — Ambulatory Visit (INDEPENDENT_AMBULATORY_CARE_PROVIDER_SITE_OTHER): Payer: PPO | Admitting: Internal Medicine

## 2017-01-11 ENCOUNTER — Encounter: Payer: Self-pay | Admitting: Internal Medicine

## 2017-01-11 ENCOUNTER — Telehealth: Payer: Self-pay | Admitting: Internal Medicine

## 2017-01-11 VITALS — BP 134/80 | HR 85 | Temp 97.7°F | Resp 16 | Wt 195.0 lb

## 2017-01-11 DIAGNOSIS — L03032 Cellulitis of left toe: Secondary | ICD-10-CM | POA: Insufficient documentation

## 2017-01-11 MED ORDER — AMOXICILLIN-POT CLAVULANATE 875-125 MG PO TABS: 1.0000 | ORAL_TABLET | Freq: Two times a day (BID) | ORAL | 0 refills | Status: DC

## 2017-01-11 NOTE — Progress Notes (Signed)
Pre visit review using our clinic review tool, if applicable. No additional management support is needed unless otherwise documented below in the visit note. 

## 2017-01-11 NOTE — Telephone Encounter (Signed)
Pt has appt with Dr Billey Gosling today at 1:45.

## 2017-01-11 NOTE — Telephone Encounter (Signed)
Patient Name: Anita Greene DOB: December 14, 1950 Initial Comment Caller states she hit toe against door last week, having pain, redness and swelling. Nurse Assessment Nurse: Angeline Slim, RN, [place] Date/Time (Eastern Time): 01/11/2017 12:40:36 PM Confirm and document reason for call. If symptomatic, describe symptoms. ---Caller states she hit her toe on door last week but it has gotten worse. Started putting gentamycin on it she had for her nose. Having to wear flip flops and nails feels loose Does the patient have any new or worsening symptoms? ---Yes Will a triage be completed? ---Yes Related visit to physician within the last 2 weeks? ---No Does the PT have any chronic conditions? (i.e. diabetes, asthma, etc.) ---Yes List chronic conditions. ---DM Is this a behavioral health or substance abuse call? ---No Guidelines Guideline Title Affirmed Question Affirmed Notes Toe Injury Toenail is completely torn off (toenail avulsion) Final Disposition User See Physician within 4 Hours (or PCP triage) Angeline Slim, RN, [place] Comments No appt available with PCP or primary office. Appt scheduled for today at 1:45 with Dr. Quay Burow at [facility] PCP OFFICE Disagree/Comply: Comply

## 2017-01-11 NOTE — Progress Notes (Signed)
Subjective:    Patient ID: Anita Greene, female    DOB: 1951-06-12, 66 y.o.   MRN: [id number]  HPI She is here for an acute visit.   Toe injury:  Last week she banged her toe into the door.  It hurt for a couple of days and then improved.    It then started to get pink and look infected.  It hurts when something touches it, but denies pain if it is not touched.  She denies numbness/tingling.  The first couple of days she saw some discharge - pussy discharge.  She put a topical antibiotic and she thinks it has helped.  She has not soaked the toe.   Her diabetes is controlled - typically in 120's, today 137.    Medications and allergies reviewed with patient and updated if appropriate.  Patient Active Problem List   Diagnosis Date Noted  . Advance directive discussed with patient 12/15/2016  . Preventative health care 06/19/2016  . Type 2 diabetes mellitus with neurological manifestations, controlled ([place])   . Fibromyalgia   . Hemiparesis affecting right side as late effect of cerebrovascular accident (CVA) ([place])   . Migraine headache   . IBS (irritable bowel syndrome)   . Mood disorder ([place])   . Obstructive sleep apnea   . Sleep disturbance   . GERD (gastroesophageal reflux disease)     Current Outpatient Prescriptions on File Prior to Visit  Medication Sig Dispense Refill  . ALPRAZolam (XANAX) 0.5 MG tablet TAKE 1 TABLET BY MOUTH TWICE (2) DAILY AS NEEDED 60 tablet 0  . aspirin EC 81 MG tablet Take 81 mg by mouth daily.     . cetirizine (ZYRTEC) 10 MG tablet Take 1 tablet (10 mg total) by mouth daily. 90 tablet 3  . cyclobenzaprine (FLEXERIL) 10 MG tablet Take 1 tablet (10 mg total) by mouth 3 (three) times daily as needed. 90 tablet 0  . diclofenac sodium (VOLTAREN) 1 % GEL APPLY 2 GRAMS TO THE SHOULDER UP TO 4 TIMES DAILY AS NEEDED 300 g 5  . diphenoxylate-atropine (LOMOTIL) 2.5-0.025 MG tablet TAKE 1 TABLET BY MOUTH 4 TIMES DAILY AS NEEDED FOR DIARRHEA OR LOOSE STOOL 60  tablet 0  . fluticasone (FLONASE) 50 MCG/ACT nasal spray Place 2 sprays into both nostrils daily.    Marland Kitchen gabapentin (NEURONTIN) 300 MG capsule Take 1 capsule (300 mg total) by mouth 2 (two) times daily. 180 capsule 3  . gentamicin ointment (GARAMYCIN) 0.1 % Apply 1 application topically as needed.    Marland Kitchen glucose blood (FREESTYLE LITE) test strip Use to check blood sugar once a day 100 each 3  . HYDROcodone-acetaminophen (NORCO/VICODIN) 5-325 MG tablet Take 1 tablet by mouth 2 (two) times daily as needed. 60 tablet 0  . Hyoscyamine Sulfate 0.375 MG TBCR Take 1 tablet (0.375 mg total) by mouth daily. 60 each 5  . lisinopril-hydrochlorothiazide (PRINZIDE,ZESTORETIC) 20-25 MG tablet Take 1 tablet by mouth daily. 90 tablet 3  . loperamide (IMODIUM A-D) 2 MG tablet Take 2 mg by mouth as needed for diarrhea or loose stools.    . meloxicam (MOBIC) 15 MG tablet Take 1 tablet (15 mg total) by mouth daily. 90 tablet 3  . metFORMIN (GLUCOPHAGE-XR) 500 MG 24 hr tablet Take 2 tablets (1,000 mg total) by mouth daily with breakfast. 180 tablet 3  . nystatin-triamcinolone (MYCOLOG II) cream Apply 1 application topically 2 (two) times daily.    . pantoprazole (PROTONIX) 40 MG tablet Take 1 tablet (  40 mg total) by mouth daily. 90 tablet 3  . silver sulfADIAZINE (SILVADENE) 1 % cream Apply 1 application topically daily. 50 g 1  . traMADol (ULTRAM) 50 MG tablet Take 1 tablet (50 mg total) by mouth every 6 (six) hours as needed. 120 tablet 0  . verapamil (CALAN) 80 MG tablet Take 1 tablet (80 mg total) by mouth 2 (two) times daily. 180 tablet 3  . zolpidem (AMBIEN) 10 MG tablet TAKE 1 TABLET BY MOUTH AT BEDTIME AS NEEDED FOR SLEEP 30 tablet 0   No current facility-administered medications on file prior to visit.     Past Medical History:  Diagnosis Date  . Allergic rhinitis due to pollen   . B12 deficiency   . Fibromyalgia   . GERD (gastroesophageal reflux disease)   . Hemiparesis affecting right side as late  effect of cerebrovascular accident (CVA) ([place])   . Hypertension   . IBS (irritable bowel syndrome)    diarrhea prone  . Iron deficiency anemia   . Migraine headache   . Mood disorder ([place])   . Obstructive sleep apnea    CPAP 11 (auto titrate)  . Optic atrophy of right eye   . PFO (patent foramen ovale)   . RLS (restless legs syndrome)   . Sleep disturbance   . Type 2 diabetes mellitus with neurological manifestations, controlled [facility])     Past Surgical History:  Procedure Laterality Date  . CATARACT EXTRACTION W/ INTRAOCULAR LENS  IMPLANT, BILATERAL    . CHOLECYSTECTOMY    . NISSEN FUNDOPLICATION    . TUBAL LIGATION    . Uroplasty  ~2000's   Dr Bernardo Heater    Social History   Social History  . Marital status: Widowed    Spouse name: N/A  . Number of children: 1  . Years of education: N/A   Occupational History  . 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 Smoking status: Never Smoker  . Smokeless tobacco: Never Used  . Alcohol use None  . Drug use: Unknown  . Sexual activity: Not Asked   Other Topics Concern  . None   Social History Narrative   1 son died    1 son living--2 grandchildren      Had living will--not sure about it   No formal health care POA--would want son   Would accept resuscitation   No prolonged tube feeds if cognitively unaware    Family History  Problem Relation Age of Onset  . Cancer Mother     lung cancer  . Parkinson's disease Father   . Dementia Father   . COPD Father   . Heart disease Father   . Cancer Sister     lung cancer  . Cancer Son     Ewing's sarcoma    Review of Systems  Constitutional: Negative for chills and fever.  Gastrointestinal: Negative for nausea.  Skin: Positive for color change and wound.  Neurological: Negative for light-headedness, numbness and headaches.       Objective:   Vitals:   01/11/17 1352  BP: 134/80  Pulse: 85  Resp: 16  Temp: 97.7 F (36.5 C)   Filed Weights    01/11/17 1352  Weight: 195 lb (88.5 kg)   Body mass index is 35.67 kg/m.  Wt Readings from Last 3 Encounters:  01/11/17 195 lb (88.5 kg)  12/15/16 190 lb (86.2 kg)  09/28/16 189 lb (85.7 kg)     Physical Exam  Constitutional: She appears well-developed and well-nourished. No distress.  Cardiovascular: Intact distal pulses.   Musculoskeletal: She exhibits no edema.  Skin: She is not diaphoretic. There is erythema (mild in distal left 5th toe).  Very small laceration of tip of left 5th toe without discharge, mild swelling, minimal tenderness, normal sensation and ROM          Assessment & Plan:   See Problem List for Assessment and Plan of chronic medical problems.

## 2017-01-11 NOTE — Assessment & Plan Note (Addendum)
Mild cellulitis of left 5th toe following an injury last week No evidence of a fracture Has diabetes, but it is well controlled She is applying topical antibiotic ointment and there has been some improvement No discharge from wound Will continue topical antibiotic ointment Start water and epsom salt soaks Given a prescription for augmentin - to use if infection does not continue to improve - she will call with any questions or concerns

## 2017-01-11 NOTE — Patient Instructions (Addendum)
Soak your toe in epsom salt and water.  Continue to apply the topical antibiotic ointment.   If the redness persists or worsens or you have any pus discharge start the oral antibiotic.  (Augmentin twice daily for one week)  Call with any questions or concerns.     Cellulitis, Adult Cellulitis is a skin infection. The infected area is usually red and tender. This condition occurs most often in the arms and lower legs. The infection can travel to the muscles, blood, and underlying tissue and become serious. It is very important to get treated for this condition. What are the causes? Cellulitis is caused by bacteria. The bacteria enter through a break in the skin, such as a cut, burn, insect bite, open sore, or crack. What increases the risk? This condition is more likely to occur in people who:  Have a weak defense system (immune system).  Have open wounds on the skin such as cuts, Juaquina Machnik, bites, and scrapes. Bacteria can enter the body through these open wounds.  Are older.  Have diabetes.  Have a type of long-lasting (chronic) liver disease (cirrhosis) or kidney disease.  Use IV drugs. What are the signs or symptoms? Symptoms of this condition include:  Redness, streaking, or spotting on the skin.  Swollen area of the skin.  Tenderness or pain when an area of the skin is touched.  Warm skin.  Fever.  Chills.  Blisters. How is this diagnosed? This condition is diagnosed based on a medical history and physical exam. You may also have tests, including:  Blood tests.  Lab tests.  Imaging tests. How is this treated? Treatment for this condition may include:  Medicines, such as antibiotic medicines or antihistamines.  Supportive care, such as rest and application of cold or warm cloths (cold or warm compresses) to the skin.  Hospital care, if the condition is severe. The infection usually gets better within 1-2 days of treatment. Follow these instructions at  home:  Take over-the-counter and prescription medicines only as told by your health care provider.  If you were prescribed an antibiotic medicine, take it as told by your health care provider. Do not stop taking the antibiotic even if you start to feel better.  Drink enough fluid to keep your urine clear or pale yellow.  Do not touch or rub the infected area.  Raise (elevate) the infected area above the level of your heart while you are sitting or lying down.  Apply warm or cold compresses to the affected area as told by your health care provider.  Keep all follow-up visits as told by your health care provider. This is important. These visits let your health care provider make sure a more serious infection is not developing. Contact a health care provider if:  You have a fever.  Your symptoms do not improve within 1-2 days of starting treatment.  Your bone or joint underneath the infected area becomes painful after the skin has healed.  Your infection returns in the same area or another area.  You notice a swollen bump in the infected area.  You develop new symptoms.  You have a general ill feeling (malaise) with muscle aches and pains. Get help right away if:  Your symptoms get worse.  You feel very sleepy.  You develop vomiting or diarrhea that persists.  You notice red streaks coming from the infected area.  Your red area gets larger or turns dark in color. This information is not intended to replace advice  given to you by your health care provider. Make sure you discuss any questions you have with your health care provider. Document Released: 07/22/2005 Document Revised: 02/20/2016 Document Reviewed: 08/21/2015 Elsevier Interactive Patient Education  2017 [employer].

## 2017-01-20 ENCOUNTER — Other Ambulatory Visit: Payer: Self-pay | Admitting: Internal Medicine

## 2017-01-20 ENCOUNTER — Other Ambulatory Visit: Payer: Self-pay | Admitting: Family Medicine

## 2017-01-20 NOTE — Telephone Encounter (Signed)
Last filled 06-22-16 #60 Last ov  12-15-16 Next OV 06-15-17

## 2017-01-20 NOTE — Telephone Encounter (Signed)
Approved: 30 x 0 

## 2017-01-20 NOTE — Telephone Encounter (Signed)
Left refill on voice mail at pharmacy  

## 2017-01-20 NOTE — Telephone Encounter (Signed)
Last office visit 02/202/018.  Last refilled 11/19/2016 for #30 with no refills. Ok to refill?

## 2017-01-27 ENCOUNTER — Encounter: Payer: Self-pay | Admitting: Internal Medicine

## 2017-01-28 ENCOUNTER — Other Ambulatory Visit: Payer: Self-pay

## 2017-01-28 ENCOUNTER — Telehealth: Payer: Self-pay

## 2017-01-28 NOTE — Telephone Encounter (Signed)
I called Anita Greene and she had the information faxed on 12/16/16.  She needed the sleep study report,but she found that in [place].

## 2017-01-28 NOTE — Telephone Encounter (Signed)
Anita Greene with [facility] [employer] requesting supportive clinical for obstructive sleep apnea and sleep study. Was that sent with order for cpap? See pt email note on 12/16/16.

## 2017-01-28 NOTE — Telephone Encounter (Deleted)
Anita Greene with [facility] [employer] requesting supportive clinical for obstructive sleep apnea.

## 2017-01-29 DIAGNOSIS — G4733 Obstructive sleep apnea (adult) (pediatric): Secondary | ICD-10-CM | POA: Diagnosis not present

## 2017-02-01 DIAGNOSIS — G4733 Obstructive sleep apnea (adult) (pediatric): Secondary | ICD-10-CM | POA: Diagnosis not present

## 2017-02-15 ENCOUNTER — Other Ambulatory Visit: Payer: Self-pay | Admitting: Internal Medicine

## 2017-02-28 DIAGNOSIS — G4733 Obstructive sleep apnea (adult) (pediatric): Secondary | ICD-10-CM | POA: Diagnosis not present

## 2017-03-15 ENCOUNTER — Other Ambulatory Visit: Payer: Self-pay | Admitting: Internal Medicine

## 2017-03-15 ENCOUNTER — Other Ambulatory Visit: Payer: Self-pay | Admitting: Family Medicine

## 2017-03-15 DIAGNOSIS — N183 Chronic kidney disease, stage 3 (moderate): Secondary | ICD-10-CM | POA: Diagnosis not present

## 2017-03-15 DIAGNOSIS — R319 Hematuria, unspecified: Secondary | ICD-10-CM | POA: Diagnosis not present

## 2017-03-15 DIAGNOSIS — I10 Essential (primary) hypertension: Secondary | ICD-10-CM | POA: Diagnosis not present

## 2017-03-15 DIAGNOSIS — R600 Localized edema: Secondary | ICD-10-CM | POA: Diagnosis not present

## 2017-03-15 NOTE — Telephone Encounter (Signed)
Electronic refill request. Last office visit:   12/15/16  Last Filled:    30 tablet 0 01/20/2017  Please advise.

## 2017-03-15 NOTE — Telephone Encounter (Signed)
Please call in.  Thanks.   

## 2017-03-16 NOTE — Telephone Encounter (Signed)
Rx called in to requested pharmacy 

## 2017-03-16 NOTE — Telephone Encounter (Signed)
Px written for call in   

## 2017-03-16 NOTE — Telephone Encounter (Signed)
Last filled 11-19-16 #60 Last OV 12-15-16 Next OV 03-26-17  Forwarding to Dr Glori Bickers in Dr Alla German absence. Please forward back to me when approved/denied. Thanks.

## 2017-03-23 DIAGNOSIS — M531 Cervicobrachial syndrome: Secondary | ICD-10-CM | POA: Diagnosis not present

## 2017-03-23 DIAGNOSIS — M9903 Segmental and somatic dysfunction of lumbar region: Secondary | ICD-10-CM | POA: Diagnosis not present

## 2017-03-23 DIAGNOSIS — M9901 Segmental and somatic dysfunction of cervical region: Secondary | ICD-10-CM | POA: Diagnosis not present

## 2017-03-23 DIAGNOSIS — M5386 Other specified dorsopathies, lumbar region: Secondary | ICD-10-CM | POA: Diagnosis not present

## 2017-03-23 DIAGNOSIS — N183 Chronic kidney disease, stage 3 (moderate): Secondary | ICD-10-CM | POA: Diagnosis not present

## 2017-03-26 ENCOUNTER — Encounter: Payer: Self-pay | Admitting: Radiology

## 2017-03-26 ENCOUNTER — Ambulatory Visit (INDEPENDENT_AMBULATORY_CARE_PROVIDER_SITE_OTHER): Payer: PPO | Admitting: Internal Medicine

## 2017-03-26 ENCOUNTER — Encounter: Payer: Self-pay | Admitting: Internal Medicine

## 2017-03-26 VITALS — BP 132/80 | HR 80 | Temp 98.3°F | Wt 192.0 lb

## 2017-03-26 DIAGNOSIS — D509 Iron deficiency anemia, unspecified: Secondary | ICD-10-CM

## 2017-03-26 DIAGNOSIS — G4733 Obstructive sleep apnea (adult) (pediatric): Secondary | ICD-10-CM

## 2017-03-26 DIAGNOSIS — F39 Unspecified mood [affective] disorder: Secondary | ICD-10-CM | POA: Diagnosis not present

## 2017-03-26 DIAGNOSIS — M791 Myalgia, unspecified site: Secondary | ICD-10-CM | POA: Insufficient documentation

## 2017-03-26 DIAGNOSIS — E1122 Type 2 diabetes mellitus with diabetic chronic kidney disease: Secondary | ICD-10-CM | POA: Insufficient documentation

## 2017-03-26 DIAGNOSIS — E1149 Type 2 diabetes mellitus with other diabetic neurological complication: Secondary | ICD-10-CM

## 2017-03-26 LAB — CBC WITH DIFFERENTIAL/PLATELET
BASOS ABS: 0 10*3/uL (ref 0.0–0.1)
Basophils Relative: 0.4 % (ref 0.0–3.0)
Eosinophils Absolute: 0.2 10*3/uL (ref 0.0–0.7)
Eosinophils Relative: 2.4 % (ref 0.0–5.0)
HCT: 32.7 % — ABNORMAL LOW (ref 36.0–46.0)
Hemoglobin: 10.6 g/dL — ABNORMAL LOW (ref 12.0–15.0)
LYMPHS ABS: 2.4 10*3/uL (ref 0.7–4.0)
Lymphocytes Relative: 24.3 % (ref 12.0–46.0)
MCHC: 32.4 g/dL (ref 30.0–36.0)
MCV: 83.3 fl (ref 78.0–100.0)
MONO ABS: 0.6 10*3/uL (ref 0.1–1.0)
MONOS PCT: 6.2 % (ref 3.0–12.0)
NEUTROS ABS: 6.5 10*3/uL (ref 1.4–7.7)
NEUTROS PCT: 66.7 % (ref 43.0–77.0)
PLATELETS: 331 10*3/uL (ref 150.0–400.0)
RBC: 3.93 Mil/uL (ref 3.87–5.11)
RDW: 17.3 % — ABNORMAL HIGH (ref 11.5–15.5)
WBC: 9.8 10*3/uL (ref 4.0–10.5)

## 2017-03-26 LAB — SEDIMENTATION RATE: SED RATE: 32 mm/h — AB (ref 0–30)

## 2017-03-26 MED ORDER — DULOXETINE HCL 30 MG PO CPEP: 30.0000 mg | ORAL_CAPSULE | Freq: Every day | ORAL | 3 refills | Status: DC

## 2017-03-26 MED ORDER — DIPHENOXYLATE-ATROPINE 2.5-0.025 MG PO TABS: ORAL_TABLET | ORAL | 0 refills | Status: DC

## 2017-03-26 MED ORDER — HYDROCODONE-ACETAMINOPHEN 5-325 MG PO TABS: 1.0000 | ORAL_TABLET | Freq: Two times a day (BID) | ORAL | 0 refills | Status: DC | PRN

## 2017-03-26 NOTE — Assessment & Plan Note (Signed)
Doing well with her new machine Compliance is very good---reviewed her download

## 2017-03-26 NOTE — Assessment & Plan Note (Signed)
Worse with stress, grieving and the pain Will try the duloxetine

## 2017-03-26 NOTE — Assessment & Plan Note (Signed)
Now seeing Dr Candiss Norse

## 2017-03-26 NOTE — Assessment & Plan Note (Addendum)
Worsened Mostly at hip girdle---will check ESR just in case Still on hydrocodone---checked CSRS---nothing except from here in past year

## 2017-03-26 NOTE — Assessment & Plan Note (Signed)
Still well controlled Will check again next visit

## 2017-03-26 NOTE — Progress Notes (Signed)
Subjective:    Patient ID: Anita Greene, female    DOB: 07/02/1951, 66 y.o.   MRN: [id number]  HPI Here for follow up of multiple medical problems  Got the new CPAP autotitrate now Variable compliance but many nights scores 100--- using the entire night and controlled I reviewed her compliance report on her phone  Did see Dr Candiss Norse for her kidneys Secondary parathyroidism Still anemic will low iron saturation---requested colonoscopy. She is taking OTC iron No constipation on this--chronic diarrhea She doesn't want colonoscopy--did have negative FIT  Chronic diarrhea Some better on metformin ER Still needs lomotil at times (or imodium)  Concerns about her toenails Great toenails "look weird" Had trash can fall on her (not feet)--but nails have ridges  Ongoing back pain from fibromyalgia (and hips) Neck pulls at times but no major shoulder problems  Mood is okay Tough month -- changes at [facility] Still grieving (would have been 13th anniversary)  Sugars have been fine Usually fasting under 120  Current Outpatient Prescriptions on File Prior to Visit  Medication Sig Dispense Refill  . ALPRAZolam (XANAX) 0.5 MG tablet TAKE 1 TABLET BY MOUTH TWICE (2) DAILY AS NEEDED 60 tablet 0  . aspirin EC 81 MG tablet Take 81 mg by mouth daily.     . cetirizine (ZYRTEC) 10 MG tablet Take 1 tablet (10 mg total) by mouth daily. 90 tablet 3  . cyclobenzaprine (FLEXERIL) 10 MG tablet TAKE 1 TABLET BY MOUTH 3 TIMES DAILY AS NEEDED 90 tablet 0  . diclofenac sodium (VOLTAREN) 1 % GEL APPLY 2 GRAMS TO THE SHOULDER UP TO 4 TIMES DAILY AS NEEDED 300 g 5  . diphenoxylate-atropine (LOMOTIL) 2.5-0.025 MG tablet TAKE 1 TABLET BY MOUTH 4 TIMES DAILY AS NEEDED FOR DIARRHEA OR LOOSE STOOL 60 tablet 0  . fluticasone (FLONASE) 50 MCG/ACT nasal spray Place 2 sprays into both nostrils daily.    Marland Kitchen gabapentin (NEURONTIN) 300 MG capsule Take 1 capsule (300 mg total) by mouth 2 (two) times daily. 180 capsule 3  .  gentamicin ointment (GARAMYCIN) 0.1 % Apply 1 application topically as needed.    Marland Kitchen glucose blood (FREESTYLE LITE) test strip Use to check blood sugar once a day 100 each 3  . HYDROcodone-acetaminophen (NORCO/VICODIN) 5-325 MG tablet Take 1 tablet by mouth 2 (two) times daily as needed. 60 tablet 0  . lisinopril-hydrochlorothiazide (PRINZIDE,ZESTORETIC) 20-25 MG tablet Take 1 tablet by mouth daily. 90 tablet 3  . loperamide (IMODIUM A-D) 2 MG tablet Take 2 mg by mouth as needed for diarrhea or loose stools.    . meloxicam (MOBIC) 15 MG tablet Take 1 tablet (15 mg total) by mouth daily. 90 tablet 3  . metFORMIN (GLUCOPHAGE-XR) 500 MG 24 hr tablet Take 2 tablets (1,000 mg total) by mouth daily with breakfast. 180 tablet 3  . nystatin-triamcinolone (MYCOLOG II) cream Apply 1 application topically 2 (two) times daily.    . pantoprazole (PROTONIX) 40 MG tablet Take 1 tablet (40 mg total) by mouth daily. 90 tablet 3  . silver sulfADIAZINE (SILVADENE) 1 % cream Apply 1 application topically daily. 50 g 1  . traMADol (ULTRAM) 50 MG tablet Take 1 tablet (50 mg total) by mouth every 6 (six) hours as needed. 120 tablet 0  . verapamil (CALAN) 80 MG tablet Take 1 tablet (80 mg total) by mouth 2 (two) times daily. 180 tablet 3  . zolpidem (AMBIEN) 10 MG tablet TAKE 1 TABLET BY MOUTH AT BEDTIME AS NEEDED FOR SLEEP  30 tablet 0   No current facility-administered medications on file prior to visit.     Allergies  Allergen Reactions  . Cefditoren Pivoxil     Other reaction(s): Other (See Comments) GI issues, severe abdominal pain  . Clarithromycin     Other reaction(s): Other (See Comments) GI issues  . Codeine Sulfate Other (See Comments)  . Erythromycin Other (See Comments)  . Other Other (See Comments)    Oral iron  . Sulfa Antibiotics Itching    Past Medical History:  Diagnosis Date  . Allergic rhinitis due to pollen   . B12 deficiency   . Fibromyalgia   . GERD (gastroesophageal reflux disease)    . Hemiparesis affecting right side as late effect of cerebrovascular accident (CVA) ([place])   . Hypertension   . IBS (irritable bowel syndrome)    diarrhea prone  . Iron deficiency anemia   . Migraine headache   . Mood disorder ([place])   . Obstructive sleep apnea    CPAP 11 (auto titrate)  . Optic atrophy of right eye   . PFO (patent foramen ovale)   . RLS (restless legs syndrome)   . Sleep disturbance   . Type 2 diabetes mellitus with neurological manifestations, controlled [facility])     Past Surgical History:  Procedure Laterality Date  . CATARACT EXTRACTION W/ INTRAOCULAR LENS  IMPLANT, BILATERAL    . CHOLECYSTECTOMY    . NISSEN FUNDOPLICATION    . TUBAL LIGATION    . Uroplasty  ~2000's   Dr Bernardo Heater    Family History  Problem Relation Age of Onset  . Cancer Mother        lung cancer  . Parkinson's disease Father   . Dementia Father   . COPD Father   . Heart disease Father   . Cancer Sister        lung cancer  . Cancer Son        Ewing's sarcoma    Social History   Social History  . Marital status: Widowed    Spouse name: N/A  . Number of children: 1  . Years of education: N/A   Occupational History  . 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 Smoking status: Never Smoker  . Smokeless tobacco: Never Used  . Alcohol use Not on file  . Drug use: Unknown  . Sexual activity: Not on file   Other Topics Concern  . Not on file   Social History Narrative   1 son died    1 son living--2 grandchildren      Had living will--not sure about it   No formal health care POA--would want son   Would accept resuscitation   No prolonged tube feeds if cognitively unaware   Review of Systems Appetite fair--afraid to eat at times due to the diarrhea Has lost some weight    Objective:   Physical Exam  Constitutional: She appears well-nourished. No distress.  Neck: No thyromegaly present.  Cardiovascular: Normal rate, regular rhythm, normal heart sounds and  intact distal pulses.  Exam reveals no gallop.   No murmur heard. Pulmonary/Chest: Effort normal and breath sounds normal. No respiratory distress. She has no wheezes. She has no rales.  Musculoskeletal: She exhibits no edema or tenderness.  Lymphadenopathy:    She has no cervical adenopathy.  Skin:  Mild ridges in great toenails No fungal infection  Psychiatric: She has a normal mood and affect. Her behavior is normal.  Assessment & Plan:

## 2017-03-26 NOTE — Assessment & Plan Note (Signed)
Doesn't want colonoscopy if she can avoid it Will recheck CBC on iron Recheck FIT also

## 2017-03-31 DIAGNOSIS — G4733 Obstructive sleep apnea (adult) (pediatric): Secondary | ICD-10-CM | POA: Diagnosis not present

## 2017-03-31 LAB — TOXASSURE SELECT 13 (MW), URINE

## 2017-04-02 ENCOUNTER — Other Ambulatory Visit (INDEPENDENT_AMBULATORY_CARE_PROVIDER_SITE_OTHER): Payer: PPO

## 2017-04-02 DIAGNOSIS — D509 Iron deficiency anemia, unspecified: Secondary | ICD-10-CM | POA: Diagnosis not present

## 2017-04-02 LAB — FECAL OCCULT BLOOD, IMMUNOCHEMICAL: Fecal Occult Bld: NEGATIVE

## 2017-04-07 ENCOUNTER — Encounter: Payer: Self-pay | Admitting: Internal Medicine

## 2017-04-07 NOTE — Telephone Encounter (Signed)
Rx written Please fax to [place]

## 2017-04-19 ENCOUNTER — Other Ambulatory Visit: Payer: Self-pay | Admitting: Internal Medicine

## 2017-04-30 DIAGNOSIS — G4733 Obstructive sleep apnea (adult) (pediatric): Secondary | ICD-10-CM | POA: Diagnosis not present

## 2017-05-17 ENCOUNTER — Other Ambulatory Visit: Payer: Self-pay | Admitting: Family Medicine

## 2017-05-17 ENCOUNTER — Other Ambulatory Visit: Payer: Self-pay | Admitting: Internal Medicine

## 2017-05-17 NOTE — Telephone Encounter (Signed)
Received refill electronically Last refill 03/15/17 #30 Last office visit 03/26/17

## 2017-05-18 NOTE — Telephone Encounter (Signed)
Rx called to pharmacy as instructed. 

## 2017-05-18 NOTE — Telephone Encounter (Signed)
Please call in.  Thanks.   

## 2017-05-19 DIAGNOSIS — G4733 Obstructive sleep apnea (adult) (pediatric): Secondary | ICD-10-CM | POA: Diagnosis not present

## 2017-05-21 DIAGNOSIS — G4733 Obstructive sleep apnea (adult) (pediatric): Secondary | ICD-10-CM | POA: Diagnosis not present

## 2017-05-31 DIAGNOSIS — G4733 Obstructive sleep apnea (adult) (pediatric): Secondary | ICD-10-CM | POA: Diagnosis not present

## 2017-06-15 ENCOUNTER — Encounter: Payer: Self-pay | Admitting: Internal Medicine

## 2017-06-15 ENCOUNTER — Ambulatory Visit (INDEPENDENT_AMBULATORY_CARE_PROVIDER_SITE_OTHER): Payer: PPO | Admitting: Internal Medicine

## 2017-06-15 VITALS — BP 98/58 | HR 63 | Temp 97.7°F | Ht 62.0 in | Wt 182.8 lb

## 2017-06-15 DIAGNOSIS — F39 Unspecified mood [affective] disorder: Secondary | ICD-10-CM

## 2017-06-15 DIAGNOSIS — M797 Fibromyalgia: Secondary | ICD-10-CM | POA: Diagnosis not present

## 2017-06-15 DIAGNOSIS — D509 Iron deficiency anemia, unspecified: Secondary | ICD-10-CM | POA: Diagnosis not present

## 2017-06-15 DIAGNOSIS — E1149 Type 2 diabetes mellitus with other diabetic neurological complication: Secondary | ICD-10-CM | POA: Diagnosis not present

## 2017-06-15 DIAGNOSIS — E1122 Type 2 diabetes mellitus with diabetic chronic kidney disease: Secondary | ICD-10-CM

## 2017-06-15 LAB — CBC WITH DIFFERENTIAL/PLATELET
Basophils Absolute: 0 10*3/uL (ref 0.0–0.1)
Basophils Relative: 0.5 % (ref 0.0–3.0)
EOS ABS: 0.3 10*3/uL (ref 0.0–0.7)
Eosinophils Relative: 3.6 % (ref 0.0–5.0)
HEMATOCRIT: 33.3 % — AB (ref 36.0–46.0)
HEMOGLOBIN: 10.6 g/dL — AB (ref 12.0–15.0)
LYMPHS PCT: 26.1 % (ref 12.0–46.0)
Lymphs Abs: 2.4 10*3/uL (ref 0.7–4.0)
MCHC: 31.9 g/dL (ref 30.0–36.0)
MCV: 86.6 fl (ref 78.0–100.0)
MONO ABS: 0.5 10*3/uL (ref 0.1–1.0)
Monocytes Relative: 5.6 % (ref 3.0–12.0)
Neutro Abs: 5.8 10*3/uL (ref 1.4–7.7)
Neutrophils Relative %: 64.2 % (ref 43.0–77.0)
Platelets: 338 10*3/uL (ref 150.0–400.0)
RBC: 3.85 Mil/uL — AB (ref 3.87–5.11)
RDW: 15.9 % — ABNORMAL HIGH (ref 11.5–15.5)
WBC: 9.1 10*3/uL (ref 4.0–10.5)

## 2017-06-15 LAB — HEMOGLOBIN A1C: HEMOGLOBIN A1C: 6.3 % (ref 4.6–6.5)

## 2017-06-15 MED ORDER — DULOXETINE HCL 30 MG PO CPEP: 30.0000 mg | ORAL_CAPSULE | Freq: Every day | ORAL | 3 refills | Status: AC

## 2017-06-15 MED ORDER — DIPHENOXYLATE-ATROPINE 2.5-0.025 MG PO TABS: ORAL_TABLET | ORAL | 0 refills | Status: DC

## 2017-06-15 NOTE — Assessment & Plan Note (Signed)
Continues with Dr Candiss Norse Vitamin D and iron for complications (anemia and secondary hyperpara)

## 2017-06-15 NOTE — Assessment & Plan Note (Signed)
Some better Less med Will stop the hydrocodone

## 2017-06-15 NOTE — Assessment & Plan Note (Signed)
Seems to still have good control Will recheck A1c

## 2017-06-15 NOTE — Progress Notes (Signed)
Subjective:    Patient ID: Anita Greene, female    DOB: 12/31/1950, 66 y.o.   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 ALPRAZolam (XANAX) 0.5 MG tablet TAKE 1 TABLET BY MOUTH TWICE (2) DAILY AS NEEDED 60 tablet 0  . aspirin EC 81 MG tablet Take 81 mg by mouth daily.     . cetirizine (ZYRTEC) 10 MG tablet Take 1 tablet (10 mg total) by mouth daily. 90 tablet 3  . cyclobenzaprine (FLEXERIL) 10 MG tablet TAKE 1 TABLET BY MOUTH 3 TIMES DAILY AS NEEDED 90 tablet 0  . diclofenac sodium (VOLTAREN) 1 % GEL APPLY 2 GRAMS TO THE SHOULDER UP TO 4 TIMES DAILY AS NEEDED 300 g 5  . diphenoxylate-atropine (LOMOTIL) 2.5-0.025 MG tablet TAKE 1 TABLET BY MOUTH 4 TIMES DAILY AS NEEDED FOR DIARRHEA OR LOOSE STOOL 60 tablet 0  . DULoxetine (CYMBALTA) 30 MG capsule Take 1 capsule (30 mg total) by mouth daily. 30 capsule 3  . gabapentin (NEURONTIN) 300 MG capsule Take 1 capsule (300 mg total) by mouth 2 (two) times daily. 180 capsule 3  . gentamicin ointment (GARAMYCIN) 0.1 % Apply 1 application topically as needed.    Marland Kitchen glucose blood (FREESTYLE LITE) test strip Use to check blood sugar once a day 100 each 3  . lisinopril-hydrochlorothiazide (PRINZIDE,ZESTORETIC) 20-25 MG tablet Take 1 tablet by mouth daily. 90 tablet 3  . metFORMIN (GLUCOPHAGE-XR) 500 MG 24 hr tablet TAKE 2 TABLETS BY MOUTH DAILY WITH BREAKFAST 180 tablet 3  . nystatin-triamcinolone (MYCOLOG II) cream Apply 1 application  topically 2 (two) times daily.    . pantoprazole (PROTONIX) 40 MG tablet TAKE 1 TABLET BY MOUTH ONCE DAILY 90 tablet 0  . silver sulfADIAZINE (SILVADENE) 1 % cream Apply 1 application topically daily. 50 g 1  . traMADol (ULTRAM) 50 MG tablet Take 1 tablet (50 mg total) by mouth every 6 (six) hours as needed. 120 tablet 0  . verapamil (CALAN) 80 MG tablet TAKE 1 TABLET BY MOUTH TWICE (2) DAILY 180 tablet 0  . zolpidem (AMBIEN) 10 MG tablet TAKE 1 TABLET BY MOUTH AT BEDTIME AS NEEDED FOR SLEEP 30 tablet 0   No current facility-administered medications on file prior to visit.     Allergies  Allergen Reactions  . Cefditoren Pivoxil     Other reaction(s): Other (See Comments) GI issues, severe abdominal pain  . Clarithromycin     Other reaction(s): Other (See Comments) GI issues  . Codeine Sulfate Other (See Comments)  . Erythromycin Other (See Comments)  . Other Other (See Comments)    Oral iron  . Sulfa Antibiotics Itching    Past Medical History:  Diagnosis Date  . Allergic rhinitis due to pollen   . B12 deficiency   . Fibromyalgia   . GERD (gastroesophageal reflux disease)   . Hemiparesis affecting right side as late effect of cerebrovascular accident (CVA) ([place])   . Hypertension   . IBS (irritable bowel syndrome)  diarrhea prone  . Iron deficiency anemia   . Migraine headache   . Mood disorder ([place])   . Obstructive sleep apnea    CPAP 11 (auto titrate)  . Optic atrophy of right eye   . PFO (patent foramen ovale)   . RLS (restless legs syndrome)   . Sleep disturbance   . Type 2 diabetes mellitus with neurological manifestations, controlled [facility])     Past Surgical History:  Procedure Laterality Date  . CATARACT EXTRACTION W/ INTRAOCULAR LENS  IMPLANT, BILATERAL    . CHOLECYSTECTOMY    . NISSEN FUNDOPLICATION    . TUBAL LIGATION    . Uroplasty  ~2000's   Dr Bernardo Heater    Family History  Problem Relation Age of Onset  . Cancer Mother        lung cancer  .  Parkinson's disease Father   . Dementia Father   . COPD Father   . Heart disease Father   . Cancer Sister        lung cancer  . Cancer Son        Ewing's sarcoma    Social History   Social History  . Marital status: Widowed    Spouse name: N/A  . Number of children: 1  . Years of education: N/A   Occupational History  . 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 Smoking status: Never Smoker  . Smokeless tobacco: Never Used  . Alcohol use Not on file  . Drug use: Unknown  . Sexual activity: Not on file   Other Topics Concern  . Not on file   Social History Narrative   1 son died    1 son living--2 grandchildren      Had living will--not sure about it   No formal health care POA--would want son   Would accept resuscitation   No prolonged tube feeds if cognitively unaware   Review of Systems Some feet swelling--- relates to being on concrete floors. Better in AM Sleeping fairly well with the CPAP Ongoing low back pain--uses ointment. Not using the hydrocodone regularly Lots of chronic diarrhea--still uses the lomotil regularly    Objective:   Physical Exam  Constitutional: She appears well-nourished. No distress.  Neck: No thyromegaly present.  Cardiovascular: Normal rate, regular rhythm, normal heart sounds and intact distal pulses.  Exam reveals no gallop.   No murmur heard. Pulmonary/Chest: Effort normal and breath sounds normal. No respiratory distress. She has no wheezes. She has no rales.  Abdominal: Soft. There is no tenderness.  Musculoskeletal: She exhibits no edema.  Lymphadenopathy:    She has no cervical adenopathy.  Psychiatric: She has a normal mood and affect. Her behavior is normal.          Assessment & Plan:

## 2017-06-15 NOTE — Assessment & Plan Note (Signed)
Better on the duloxetine Will continue

## 2017-06-15 NOTE — Assessment & Plan Note (Signed)
Will recheck at her request Doubled her iron

## 2017-07-01 DIAGNOSIS — G4733 Obstructive sleep apnea (adult) (pediatric): Secondary | ICD-10-CM | POA: Diagnosis not present

## 2017-07-15 ENCOUNTER — Other Ambulatory Visit: Payer: Self-pay | Admitting: Family Medicine

## 2017-07-15 ENCOUNTER — Other Ambulatory Visit: Payer: Self-pay | Admitting: Internal Medicine

## 2017-07-16 NOTE — Telephone Encounter (Signed)
Left refill on voice mail at pharmacy  

## 2017-07-16 NOTE — Telephone Encounter (Signed)
Last filled 03-16-17 #60 Last OV 06-15-17 No Future OV  Forward to Dr Diona Browner in Dr Alla German absence. Please send back to me when done

## 2017-07-22 ENCOUNTER — Other Ambulatory Visit: Payer: Self-pay | Admitting: Family Medicine

## 2017-07-23 NOTE — Telephone Encounter (Signed)
Approved: #30 x 0 Should probably change it to 0.5-1 nightly

## 2017-07-23 NOTE — Telephone Encounter (Signed)
Left refill on voice mail at pharmacy Used 0.5-1 tab directions

## 2017-07-23 NOTE — Telephone Encounter (Signed)
Last filled 05-18-17 #30 Last OV [id number] No Future OV

## 2017-07-26 DIAGNOSIS — G4733 Obstructive sleep apnea (adult) (pediatric): Secondary | ICD-10-CM | POA: Diagnosis not present

## 2017-07-31 DIAGNOSIS — G4733 Obstructive sleep apnea (adult) (pediatric): Secondary | ICD-10-CM | POA: Diagnosis not present

## 2017-08-16 ENCOUNTER — Encounter: Payer: Self-pay | Admitting: Internal Medicine

## 2017-08-31 DIAGNOSIS — G4733 Obstructive sleep apnea (adult) (pediatric): Secondary | ICD-10-CM | POA: Diagnosis not present

## 2017-09-10 ENCOUNTER — Other Ambulatory Visit: Payer: Self-pay | Admitting: Internal Medicine

## 2017-09-10 NOTE — Telephone Encounter (Signed)
Left refills on voice mail at pharmacy  

## 2017-09-10 NOTE — Telephone Encounter (Signed)
Approved:okay to fill each for same quantities without refill

## 2017-09-10 NOTE — Telephone Encounter (Signed)
Zolpidem last filled 07-23-17  #30 Alprazolam last filled 07-16-17 #60 Lomotil last filled 06-24-17  Last OV 06-15-17 No Future OV

## 2017-09-13 DIAGNOSIS — I10 Essential (primary) hypertension: Secondary | ICD-10-CM | POA: Diagnosis not present

## 2017-09-13 DIAGNOSIS — N183 Chronic kidney disease, stage 3 (moderate): Secondary | ICD-10-CM | POA: Diagnosis not present

## 2017-09-13 DIAGNOSIS — R600 Localized edema: Secondary | ICD-10-CM | POA: Diagnosis not present

## 2017-09-13 DIAGNOSIS — R319 Hematuria, unspecified: Secondary | ICD-10-CM | POA: Diagnosis not present

## 2017-09-20 DIAGNOSIS — R319 Hematuria, unspecified: Secondary | ICD-10-CM | POA: Diagnosis not present

## 2017-09-20 DIAGNOSIS — N183 Chronic kidney disease, stage 3 (moderate): Secondary | ICD-10-CM | POA: Diagnosis not present

## 2017-09-20 DIAGNOSIS — R600 Localized edema: Secondary | ICD-10-CM | POA: Diagnosis not present

## 2017-09-20 DIAGNOSIS — I10 Essential (primary) hypertension: Secondary | ICD-10-CM | POA: Diagnosis not present

## 2017-09-30 DIAGNOSIS — G4733 Obstructive sleep apnea (adult) (pediatric): Secondary | ICD-10-CM | POA: Diagnosis not present

## 2017-10-31 DIAGNOSIS — G4733 Obstructive sleep apnea (adult) (pediatric): Secondary | ICD-10-CM | POA: Diagnosis not present

## 2017-11-04 DIAGNOSIS — G4733 Obstructive sleep apnea (adult) (pediatric): Secondary | ICD-10-CM | POA: Diagnosis not present

## 2017-11-04 DIAGNOSIS — D509 Iron deficiency anemia, unspecified: Secondary | ICD-10-CM | POA: Diagnosis not present

## 2017-11-04 DIAGNOSIS — Q211 Atrial septal defect: Secondary | ICD-10-CM | POA: Diagnosis not present

## 2017-11-04 DIAGNOSIS — E119 Type 2 diabetes mellitus without complications: Secondary | ICD-10-CM | POA: Diagnosis not present

## 2017-11-04 DIAGNOSIS — M79672 Pain in left foot: Secondary | ICD-10-CM | POA: Diagnosis not present

## 2017-11-04 DIAGNOSIS — G43909 Migraine, unspecified, not intractable, without status migrainosus: Secondary | ICD-10-CM | POA: Diagnosis not present

## 2017-11-04 DIAGNOSIS — L659 Nonscarring hair loss, unspecified: Secondary | ICD-10-CM | POA: Diagnosis not present

## 2017-11-04 DIAGNOSIS — M797 Fibromyalgia: Secondary | ICD-10-CM | POA: Diagnosis not present

## 2017-11-04 DIAGNOSIS — I10 Essential (primary) hypertension: Secondary | ICD-10-CM | POA: Diagnosis not present

## 2017-11-04 DIAGNOSIS — E538 Deficiency of other specified B group vitamins: Secondary | ICD-10-CM | POA: Diagnosis not present

## 2017-11-04 DIAGNOSIS — I679 Cerebrovascular disease, unspecified: Secondary | ICD-10-CM | POA: Diagnosis not present

## 2017-11-04 DIAGNOSIS — K580 Irritable bowel syndrome with diarrhea: Secondary | ICD-10-CM | POA: Diagnosis not present

## 2017-12-01 DIAGNOSIS — G4733 Obstructive sleep apnea (adult) (pediatric): Secondary | ICD-10-CM | POA: Diagnosis not present

## 2017-12-29 DIAGNOSIS — G4733 Obstructive sleep apnea (adult) (pediatric): Secondary | ICD-10-CM | POA: Diagnosis not present

## 2018-01-17 ENCOUNTER — Other Ambulatory Visit: Payer: Self-pay | Admitting: Internal Medicine

## 2018-01-25 ENCOUNTER — Other Ambulatory Visit: Payer: Self-pay | Admitting: Gastroenterology

## 2018-01-25 ENCOUNTER — Other Ambulatory Visit: Payer: Self-pay | Admitting: *Deleted

## 2018-01-25 DIAGNOSIS — R1314 Dysphagia, pharyngoesophageal phase: Secondary | ICD-10-CM

## 2018-01-25 DIAGNOSIS — K529 Noninfective gastroenteritis and colitis, unspecified: Secondary | ICD-10-CM | POA: Diagnosis not present

## 2018-01-25 DIAGNOSIS — Z86010 Personal history of colonic polyps: Secondary | ICD-10-CM | POA: Diagnosis not present

## 2018-01-28 DIAGNOSIS — I10 Essential (primary) hypertension: Secondary | ICD-10-CM | POA: Diagnosis not present

## 2018-01-28 DIAGNOSIS — E538 Deficiency of other specified B group vitamins: Secondary | ICD-10-CM | POA: Diagnosis not present

## 2018-01-28 DIAGNOSIS — E119 Type 2 diabetes mellitus without complications: Secondary | ICD-10-CM | POA: Diagnosis not present

## 2018-01-28 DIAGNOSIS — Z86010 Personal history of colonic polyps: Secondary | ICD-10-CM | POA: Diagnosis not present

## 2018-01-28 DIAGNOSIS — L659 Nonscarring hair loss, unspecified: Secondary | ICD-10-CM | POA: Diagnosis not present

## 2018-01-28 DIAGNOSIS — K529 Noninfective gastroenteritis and colitis, unspecified: Secondary | ICD-10-CM | POA: Diagnosis not present

## 2018-01-28 DIAGNOSIS — R1314 Dysphagia, pharyngoesophageal phase: Secondary | ICD-10-CM | POA: Diagnosis not present

## 2018-01-28 DIAGNOSIS — D509 Iron deficiency anemia, unspecified: Secondary | ICD-10-CM | POA: Diagnosis not present

## 2018-01-31 ENCOUNTER — Other Ambulatory Visit
Admission: RE | Admit: 2018-01-31 | Discharge: 2018-01-31 | Disposition: A | Discharge status: Home / Self Care | Payer: PPO | Source: Ambulatory Visit | Attending: Gastroenterology | Admitting: Gastroenterology

## 2018-01-31 ENCOUNTER — Ambulatory Visit
Admission: RE | Admit: 2018-01-31 | Discharge: 2018-01-31 | Disposition: A | Discharge status: Home / Self Care | Payer: PPO | Source: Ambulatory Visit | Attending: Gastroenterology | Admitting: Gastroenterology

## 2018-01-31 DIAGNOSIS — R1314 Dysphagia, pharyngoesophageal phase: Secondary | ICD-10-CM | POA: Diagnosis not present

## 2018-01-31 DIAGNOSIS — K529 Noninfective gastroenteritis and colitis, unspecified: Secondary | ICD-10-CM | POA: Diagnosis not present

## 2018-01-31 DIAGNOSIS — K449 Diaphragmatic hernia without obstruction or gangrene: Secondary | ICD-10-CM | POA: Insufficient documentation

## 2018-01-31 LAB — GASTROINTESTINAL PANEL BY PCR, STOOL (REPLACES STOOL CULTURE)

## 2018-01-31 LAB — C DIFFICILE QUICK SCREEN W PCR REFLEX
C Diff antigen: NEGATIVE
C Diff interpretation: NOT DETECTED
C Diff toxin: NEGATIVE

## 2018-02-02 DIAGNOSIS — M79671 Pain in right foot: Secondary | ICD-10-CM | POA: Diagnosis not present

## 2018-02-02 DIAGNOSIS — J019 Acute sinusitis, unspecified: Secondary | ICD-10-CM | POA: Diagnosis not present

## 2018-02-02 DIAGNOSIS — Z780 Asymptomatic menopausal state: Secondary | ICD-10-CM | POA: Diagnosis not present

## 2018-02-02 DIAGNOSIS — D72829 Elevated white blood cell count, unspecified: Secondary | ICD-10-CM | POA: Diagnosis not present

## 2018-02-02 DIAGNOSIS — G4733 Obstructive sleep apnea (adult) (pediatric): Secondary | ICD-10-CM | POA: Diagnosis not present

## 2018-02-02 DIAGNOSIS — Z23 Encounter for immunization: Secondary | ICD-10-CM | POA: Diagnosis not present

## 2018-02-02 DIAGNOSIS — R42 Dizziness and giddiness: Secondary | ICD-10-CM | POA: Diagnosis not present

## 2018-02-02 DIAGNOSIS — Z124 Encounter for screening for malignant neoplasm of cervix: Secondary | ICD-10-CM | POA: Diagnosis not present

## 2018-02-02 DIAGNOSIS — Z0000 Encounter for general adult medical examination without abnormal findings: Secondary | ICD-10-CM | POA: Diagnosis not present

## 2018-02-02 DIAGNOSIS — E785 Hyperlipidemia, unspecified: Secondary | ICD-10-CM | POA: Diagnosis not present

## 2018-02-02 DIAGNOSIS — R110 Nausea: Secondary | ICD-10-CM | POA: Diagnosis not present

## 2018-02-02 DIAGNOSIS — E538 Deficiency of other specified B group vitamins: Secondary | ICD-10-CM | POA: Diagnosis not present

## 2018-02-07 ENCOUNTER — Other Ambulatory Visit: Payer: Self-pay | Admitting: Family Medicine

## 2018-02-07 DIAGNOSIS — Z1231 Encounter for screening mammogram for malignant neoplasm of breast: Secondary | ICD-10-CM

## 2018-02-08 DIAGNOSIS — E538 Deficiency of other specified B group vitamins: Secondary | ICD-10-CM | POA: Diagnosis not present

## 2018-02-16 DIAGNOSIS — M8588 Other specified disorders of bone density and structure, other site: Secondary | ICD-10-CM | POA: Diagnosis not present

## 2018-02-16 DIAGNOSIS — M7731 Calcaneal spur, right foot: Secondary | ICD-10-CM | POA: Diagnosis not present

## 2018-02-16 DIAGNOSIS — Z780 Asymptomatic menopausal state: Secondary | ICD-10-CM | POA: Diagnosis not present

## 2018-02-16 DIAGNOSIS — M7732 Calcaneal spur, left foot: Secondary | ICD-10-CM | POA: Diagnosis not present

## 2018-02-16 DIAGNOSIS — M79672 Pain in left foot: Secondary | ICD-10-CM | POA: Diagnosis not present

## 2018-02-16 DIAGNOSIS — M722 Plantar fascial fibromatosis: Secondary | ICD-10-CM | POA: Diagnosis not present

## 2018-02-16 DIAGNOSIS — E119 Type 2 diabetes mellitus without complications: Secondary | ICD-10-CM | POA: Diagnosis not present

## 2018-02-16 DIAGNOSIS — M7672 Peroneal tendinitis, left leg: Secondary | ICD-10-CM | POA: Diagnosis not present

## 2018-02-16 DIAGNOSIS — M79671 Pain in right foot: Secondary | ICD-10-CM | POA: Diagnosis not present

## 2018-02-18 DIAGNOSIS — E538 Deficiency of other specified B group vitamins: Secondary | ICD-10-CM | POA: Diagnosis not present

## 2018-02-25 DIAGNOSIS — E538 Deficiency of other specified B group vitamins: Secondary | ICD-10-CM | POA: Diagnosis not present

## 2018-03-03 ENCOUNTER — Ambulatory Visit
Admission: RE | Admit: 2018-03-03 | Discharge: 2018-03-03 | Disposition: A | Discharge status: Home / Self Care | Payer: PPO | Source: Ambulatory Visit | Attending: Family Medicine | Admitting: Family Medicine

## 2018-03-03 DIAGNOSIS — Z1231 Encounter for screening mammogram for malignant neoplasm of breast: Secondary | ICD-10-CM | POA: Insufficient documentation

## 2018-03-22 ENCOUNTER — Ambulatory Visit: Payer: PPO | Attending: Neurology

## 2018-03-22 DIAGNOSIS — I10 Essential (primary) hypertension: Secondary | ICD-10-CM | POA: Insufficient documentation

## 2018-03-22 DIAGNOSIS — Z6835 Body mass index (BMI) 35.0-35.9, adult: Secondary | ICD-10-CM | POA: Diagnosis not present

## 2018-03-22 DIAGNOSIS — G4733 Obstructive sleep apnea (adult) (pediatric): Secondary | ICD-10-CM | POA: Insufficient documentation

## 2018-03-22 DIAGNOSIS — M722 Plantar fascial fibromatosis: Secondary | ICD-10-CM | POA: Diagnosis not present

## 2018-03-22 DIAGNOSIS — Z8673 Personal history of transient ischemic attack (TIA), and cerebral infarction without residual deficits: Secondary | ICD-10-CM | POA: Diagnosis not present

## 2018-03-22 DIAGNOSIS — E119 Type 2 diabetes mellitus without complications: Secondary | ICD-10-CM | POA: Diagnosis not present

## 2018-03-22 DIAGNOSIS — E669 Obesity, unspecified: Secondary | ICD-10-CM | POA: Insufficient documentation

## 2018-03-22 DIAGNOSIS — M7672 Peroneal tendinitis, left leg: Secondary | ICD-10-CM | POA: Diagnosis not present

## 2018-03-22 DIAGNOSIS — F5101 Primary insomnia: Secondary | ICD-10-CM | POA: Insufficient documentation

## 2018-04-04 ENCOUNTER — Ambulatory Visit: Payer: PPO | Admitting: Anesthesiology

## 2018-04-04 ENCOUNTER — Ambulatory Visit
Admission: RE | Admit: 2018-04-04 | Discharge: 2018-04-04 | Disposition: A | Discharge status: Home / Self Care | Payer: PPO | Source: Ambulatory Visit | Attending: Gastroenterology | Admitting: Gastroenterology

## 2018-04-04 ENCOUNTER — Encounter
Admission: RE | Disposition: A | Discharge status: Home / Self Care | Payer: Self-pay | Source: Ambulatory Visit | Attending: Gastroenterology

## 2018-04-04 ENCOUNTER — Encounter: Payer: Self-pay | Admitting: Anesthesiology

## 2018-04-04 DIAGNOSIS — R1310 Dysphagia, unspecified: Secondary | ICD-10-CM | POA: Diagnosis not present

## 2018-04-04 DIAGNOSIS — G4733 Obstructive sleep apnea (adult) (pediatric): Secondary | ICD-10-CM | POA: Insufficient documentation

## 2018-04-04 DIAGNOSIS — E1122 Type 2 diabetes mellitus with diabetic chronic kidney disease: Secondary | ICD-10-CM | POA: Insufficient documentation

## 2018-04-04 DIAGNOSIS — G2581 Restless legs syndrome: Secondary | ICD-10-CM | POA: Insufficient documentation

## 2018-04-04 DIAGNOSIS — K210 Gastro-esophageal reflux disease with esophagitis: Secondary | ICD-10-CM | POA: Diagnosis not present

## 2018-04-04 DIAGNOSIS — K449 Diaphragmatic hernia without obstruction or gangrene: Secondary | ICD-10-CM | POA: Diagnosis not present

## 2018-04-04 DIAGNOSIS — Z7984 Long term (current) use of oral hypoglycemic drugs: Secondary | ICD-10-CM | POA: Insufficient documentation

## 2018-04-04 DIAGNOSIS — K219 Gastro-esophageal reflux disease without esophagitis: Secondary | ICD-10-CM | POA: Diagnosis not present

## 2018-04-04 DIAGNOSIS — N189 Chronic kidney disease, unspecified: Secondary | ICD-10-CM | POA: Insufficient documentation

## 2018-04-04 DIAGNOSIS — I129 Hypertensive chronic kidney disease with stage 1 through stage 4 chronic kidney disease, or unspecified chronic kidney disease: Secondary | ICD-10-CM | POA: Diagnosis not present

## 2018-04-04 DIAGNOSIS — K2950 Unspecified chronic gastritis without bleeding: Secondary | ICD-10-CM | POA: Insufficient documentation

## 2018-04-04 DIAGNOSIS — Z79899 Other long term (current) drug therapy: Secondary | ICD-10-CM | POA: Insufficient documentation

## 2018-04-04 DIAGNOSIS — R197 Diarrhea, unspecified: Secondary | ICD-10-CM | POA: Diagnosis not present

## 2018-04-04 DIAGNOSIS — K648 Other hemorrhoids: Secondary | ICD-10-CM | POA: Diagnosis not present

## 2018-04-04 HISTORY — DX: Chronic kidney disease, unspecified: N18.9

## 2018-04-04 HISTORY — PX: COLONOSCOPY WITH PROPOFOL: SHX5780

## 2018-04-04 HISTORY — DX: Cerebral infarction, unspecified: I63.9

## 2018-04-04 HISTORY — DX: Unspecified osteoarthritis, unspecified site: M19.90

## 2018-04-04 HISTORY — PX: ESOPHAGOGASTRODUODENOSCOPY (EGD) WITH PROPOFOL: SHX5813

## 2018-04-04 LAB — GLUCOSE, CAPILLARY: Glucose-Capillary: 104 mg/dL — ABNORMAL HIGH (ref 65–99)

## 2018-04-04 SURGERY — COLONOSCOPY WITH PROPOFOL
Anesthesia: General

## 2018-04-04 MED ORDER — PROPOFOL 500 MG/50ML IV EMUL
INTRAVENOUS | Status: AC
  Filled 2018-04-04: qty 50

## 2018-04-04 MED ORDER — PROPOFOL 500 MG/50ML IV EMUL
INTRAVENOUS | Status: DC | PRN
  Administered 2018-04-04: 120 ug/kg/min via INTRAVENOUS

## 2018-04-04 MED ORDER — FENTANYL CITRATE (PF) 100 MCG/2ML IJ SOLN
INTRAMUSCULAR | Status: AC
  Filled 2018-04-04: qty 2

## 2018-04-04 MED ORDER — PROPOFOL 10 MG/ML IV BOLUS
INTRAVENOUS | Status: AC
  Filled 2018-04-04: qty 20

## 2018-04-04 MED ORDER — BUTAMBEN-TETRACAINE-BENZOCAINE 2-2-14 % EX AERO
INHALATION_SPRAY | CUTANEOUS | Status: AC
  Filled 2018-04-04: qty 5

## 2018-04-04 MED ORDER — EPHEDRINE SULFATE 50 MG/ML IJ SOLN
INTRAMUSCULAR | Status: AC
  Filled 2018-04-04: qty 1

## 2018-04-04 MED ORDER — LIDOCAINE HCL (PF) 2 % IJ SOLN
INTRAMUSCULAR | Status: AC
  Filled 2018-04-04: qty 10

## 2018-04-04 MED ORDER — PHENYLEPHRINE HCL 10 MG/ML IJ SOLN
INTRAMUSCULAR | Status: AC
  Filled 2018-04-04: qty 1

## 2018-04-04 MED ORDER — SODIUM CHLORIDE 0.9 % IV SOLN
INTRAVENOUS | Status: DC
  Administered 2018-04-04: 08:00:00 via INTRAVENOUS

## 2018-04-04 MED ORDER — MIDAZOLAM HCL 2 MG/2ML IJ SOLN
INTRAMUSCULAR | Status: DC | PRN
  Administered 2018-04-04: 2 mg via INTRAVENOUS

## 2018-04-04 MED ORDER — MIDAZOLAM HCL 2 MG/2ML IJ SOLN
INTRAMUSCULAR | Status: AC
  Filled 2018-04-04: qty 2

## 2018-04-04 MED ORDER — FENTANYL CITRATE (PF) 100 MCG/2ML IJ SOLN
INTRAMUSCULAR | Status: DC | PRN
  Administered 2018-04-04 (×2): 25 ug via INTRAVENOUS
  Administered 2018-04-04: 50 ug via INTRAVENOUS

## 2018-04-04 MED ORDER — SODIUM CHLORIDE 0.9 % IV SOLN: INTRAVENOUS | Status: DC

## 2018-04-04 MED ORDER — EPHEDRINE SULFATE 50 MG/ML IJ SOLN
INTRAMUSCULAR | Status: DC | PRN
  Administered 2018-04-04 (×3): 10 mg via INTRAVENOUS

## 2018-04-04 MED ORDER — PHENYLEPHRINE HCL 10 MG/ML IJ SOLN
INTRAMUSCULAR | Status: DC | PRN
  Administered 2018-04-04 (×3): 50 ug via INTRAVENOUS

## 2018-04-04 MED ORDER — LIDOCAINE HCL (CARDIAC) PF 100 MG/5ML IV SOSY
PREFILLED_SYRINGE | INTRAVENOUS | Status: DC | PRN
  Administered 2018-04-04: 30 mg via INTRAVENOUS

## 2018-04-04 NOTE — Anesthesia Procedure Notes (Signed)
Performed by: Cook-Martin, Jerimiah Wolman Pre-anesthesia Checklist: Patient identified, Emergency Drugs available, Suction available, Patient being monitored and Timeout performed Patient Re-evaluated:Patient Re-evaluated prior to induction Oxygen Delivery Method: Nasal cannula Preoxygenation: Pre-oxygenation with 100% oxygen Induction Type: IV induction Airway Equipment and Method: Bite block Placement Confirmation: positive ETCO2 and CO2 detector       

## 2018-04-04 NOTE — Transfer of Care (Signed)
Immediate Anesthesia Transfer of Care Note  Patient: Anita Greene  Procedure(s) Performed: COLONOSCOPY WITH PROPOFOL (N/A ) ESOPHAGOGASTRODUODENOSCOPY (EGD) WITH PROPOFOL (N/A )  Patient Location: PACU  Anesthesia Type:General  Level of Consciousness: awake and sedated  Airway & Oxygen Therapy: Patient Spontanous Breathing and Patient connected to nasal cannula oxygen  Post-op Assessment: Report given to RN and Post -op Vital signs reviewed and stable  Post vital signs: Reviewed and stable  Last Vitals:  Vitals Value Taken Time  BP    Temp    Pulse    Resp    SpO2      Last Pain:  Vitals:   04/04/18 0714  PainSc: 0-No pain         Complications: No apparent anesthesia complications

## 2018-04-04 NOTE — H&P (Signed)
Outpatient short stay form Pre-procedure 04/04/2018 7:30 AM Lollie Sails MD  Primary Physician: [place] Pink MD D Reason for visit: EGD and colonoscopy  History of present illness: Patient is a 67 year old female presenting today as above.  She has a personal history of chronic diarrhea.  She is recently been started on some Questran and that has helped considerably.  She also has a history of some dysphagia however she has a very large paraesophageal hernia.  A tablet is noted to catch briefly at the paraesophageal hernia itself.  Does not regurgitate foods.  She has had a fundoplication in the past.  She tolerated her prep well.  She takes no aspirin or blood thinning agent with the exception of 81 mg aspirin that she she has held for several days.    Current Facility-Administered Medications:  .  0.9 %  sodium chloride infusion, , Intravenous, Continuous, Lollie Sails, MD .  0.9 %  sodium chloride infusion, , Intravenous, Continuous, Lollie Sails, MD .  butamben-tetracaine-benzocaine (CETACAINE) 11-27-12 % spray, , , ,   Medications Prior to Admission  Medication Sig Dispense Refill Last Dose  . ALPRAZolam (XANAX) 0.5 MG tablet TAKE ONE TABLET BY MOUTH TWICE DAILY AS NEEDED 60 tablet 0 04/03/2018 at Unknown time  . ascorbic acid (VITAMIN C) 500 MG tablet Take 500 mg by mouth daily.   03/28/2018  . cetirizine (ZYRTEC) 10 MG tablet Take 1 tablet (10 mg total) by mouth daily. 90 tablet 3 04/03/2018 at Unknown time  . cyclobenzaprine (FLEXERIL) 10 MG tablet TAKE 1 TABLET BY MOUTH 3 TIMES DAILY AS NEEDED 90 tablet 0 Past Week at Unknown time  . diclofenac sodium (VOLTAREN) 1 % GEL APPLY 2 GRAMS TO THE SHOULDER UP TO 4 TIMES DAILY AS NEEDED 300 g 0 Past Month at Unknown time  . diphenoxylate-atropine (LOMOTIL) 2.5-0.025 MG tablet TAKE 1 TABLET BY MOUTH 4 TIMES DAILY AS NEEDED FOR DIARRHEA OR LOOSE STOOLS. 90 tablet 0 Past Month at Unknown time  . DULoxetine (CYMBALTA) 30 MG capsule  Take 1 capsule (30 mg total) by mouth daily. 90 capsule 3 04/03/2018 at Unknown time  . ferrous sulfate 325 (65 FE) MG tablet Take 325 mg by mouth 2 (two) times daily with a meal.   03/30/2018  . gabapentin (NEURONTIN) 300 MG capsule TAKE 1 CAPSULE BY MOUTH 2 TIMES DAILY 180 capsule 0 04/03/2018 at Unknown time  . gentamicin ointment (GARAMYCIN) 0.1 % Apply 1 application topically as needed.   Past Month at Unknown time  . lisinopril-hydrochlorothiazide (PRINZIDE,ZESTORETIC) 20-25 MG tablet Take 1 tablet by mouth daily. 90 tablet 3 04/04/2018 at Unknown time  . metFORMIN (GLUCOPHAGE-XR) 500 MG 24 hr tablet TAKE 2 TABLETS BY MOUTH DAILY WITH BREAKFAST 180 tablet 3 04/03/2018 at Unknown time  . Multiple Vitamin (MULTIVITAMIN) tablet Take 1 tablet by mouth daily.   03/30/2018  . nystatin-triamcinolone (MYCOLOG II) cream Apply 1 application topically 2 (two) times daily.   Past Month at Unknown time  . pantoprazole (PROTONIX) 40 MG tablet TAKE 1 TABLET BY MOUTH ONCE DAILY 90 tablet 0 04/04/2018 at Unknown time  . silver sulfADIAZINE (SILVADENE) 1 % cream Apply 1 application topically daily. 50 g 1 Past Month at Unknown time  . verapamil (CALAN) 80 MG tablet TAKE 1 TABLET BY MOUTH TWICE A DAY 180 tablet 3 04/04/2018 at Unknown time  . zolpidem (AMBIEN) 10 MG tablet TAKE 1 TABLET BY MOUTH AT BEDTIME AS NEEDED FOR SLEEP 30 tablet 0 04/03/2018  at Unknown time  . aspirin EC 81 MG tablet Take 81 mg by mouth daily.    03/30/2018  . glucose blood (FREESTYLE LITE) test strip USE AS DIRECTED TO CHECK BLOOD SUGAR ONCE DAILY 100 each 0   . traMADol (ULTRAM) 50 MG tablet Take 1 tablet (50 mg total) by mouth every 6 (six) hours as needed. 120 tablet 0 04/02/2018     Allergies  Allergen Reactions  . Cefditoren Pivoxil     Other reaction(s): Other (See Comments) GI issues, severe abdominal pain  . Clarithromycin     Other reaction(s): Other (See Comments) GI issues  . Codeine Sulfate Other (See Comments)  . Erythromycin Other  (See Comments)  . Other Other (See Comments)    Oral iron  . Pseudoephedrine     ams  . Sulfa Antibiotics Itching     Past Medical History:  Diagnosis Date  . Allergic rhinitis due to pollen   . Arthritis   . B12 deficiency   . Chronic kidney disease   . Fibromyalgia   . GERD (gastroesophageal reflux disease)   . Hemiparesis affecting right side as late effect of cerebrovascular accident (CVA) ([place])   . Hypertension   . IBS (irritable bowel syndrome)    diarrhea prone  . Iron deficiency anemia   . Migraine headache   . Mood disorder ([place])   . Obstructive sleep apnea    CPAP 11 (auto titrate)  . Optic atrophy of right eye   . PFO (patent foramen ovale)   . RLS (restless legs syndrome)   . Sleep disturbance   . Stroke ([place])   . Type 2 diabetes mellitus with neurological manifestations, controlled ([place])     Review of systems:      Physical Exam    Heart and lungs: Regular rate and rhythm without rub or gallop, lungs are bilaterally clear.    HEENT: Normocephalic atraumatic eyes are anicteric    Other:    Pertinant exam for procedure: Soft nontender nondistended bowel sounds positive normoactive    Planned proceedures: EGD, colonoscopy and indicated procedures. I have discussed the risks benefits and complications of procedures to include not limited to bleeding, infection, perforation and the risk of sedation and the patient wishes to proceed.    Lollie Sails, MD Gastroenterology 04/04/2018  7:30 AM

## 2018-04-04 NOTE — Anesthesia Postprocedure Evaluation (Signed)
Anesthesia Post Note  Patient: VALORIA TAMBURRI  Procedure(s) Performed: COLONOSCOPY WITH PROPOFOL (N/A ) ESOPHAGOGASTRODUODENOSCOPY (EGD) WITH PROPOFOL (N/A )  Patient location during evaluation: Endoscopy Anesthesia Type: General Level of consciousness: awake and alert Pain management: pain level controlled Vital Signs Assessment: post-procedure vital signs reviewed and stable Respiratory status: spontaneous breathing, nonlabored ventilation, respiratory function stable and patient connected to nasal cannula oxygen Cardiovascular status: blood pressure returned to baseline and stable Postop Assessment: no apparent nausea or vomiting Anesthetic complications: no     Last Vitals:  Vitals:   04/04/18 0850 04/04/18 0900  BP: (!) 100/45 103/62  Pulse: 79 79  Resp:    Temp:    SpO2: 98% 98%    Last Pain:  Vitals:   04/04/18 0830  TempSrc: Tympanic  PainSc:                  Martha Clan

## 2018-04-04 NOTE — Op Note (Signed)
[facility] Gastroenterology Patient Name: Anita Greene Procedure Date: 04/04/2018 7:26 AM MRN: [id number] Account #: [id number] Date of Birth: 1950-11-25 Admit Type: Outpatient Age: 67 Room: [facility] ENDO ROOM 1 Gender: Female Note Status: Finalized Procedure:            Colonoscopy Providers:            Lollie Sails, MD Referring MD:         Irven Easterly. Kary Kos, MD (Referring MD) Medicines:            Monitored Anesthesia Care Complications:        No immediate complications. Procedure:            Pre-Anesthesia Assessment:                       - ASA Grade Assessment: III - A patient with severe                        systemic disease.                       After obtaining informed consent, the colonoscope was                        passed under direct vision. Throughout the procedure,                        the patient's blood pressure, pulse, and oxygen                        saturations were monitored continuously. The                        Colonoscope was introduced through the anus and                        advanced to the the cecum, identified by appendiceal                        orifice and ileocecal valve. The colonoscopy was                        performed with moderate difficulty due to significant                        looping. Successful completion of the procedure was                        aided by changing the patient to a supine position,                        changing the patient to a prone position and using                        manual pressure. The patient tolerated the procedure                        well. The quality of the bowel preparation was good                        except the cecum  was fair. Findings:      The colon (entire examined portion) appeared normal.      Biopsies for histology were taken with a cold forceps from the right       colon and left colon for evaluation of microscopic colitis. Impression:           -  The entire examined colon is normal.                       - Biopsies were taken with a cold forceps from the                        right colon and left colon for evaluation of                        microscopic colitis. Recommendation:       - Discharge patient to home.                       - Continue present medications.                       - Return to GI clinic in 3 weeks.                       - Await pathology results. Procedure Code(s):    --- Professional ---                       [phone], Colonoscopy, flexible; with biopsy, single or                        multiple CPT copyright 2017 American Medical Association. All rights reserved. The codes documented in this report are preliminary and upon coder review may  be revised to meet current compliance requirements. Lollie Sails, MD 04/04/2018 8:35:20 AM This report has been signed electronically. Number of Addenda: 0 Note Initiated On: 04/04/2018 7:26 AM Scope Withdrawal Time: 0 hours 8 minutes 16 seconds  Total Procedure Duration: 0 hours 27 minutes 8 seconds       [facility]

## 2018-04-04 NOTE — Anesthesia Preprocedure Evaluation (Addendum)
Anesthesia Evaluation  Patient identified by MRN, date of birth, ID band Patient awake    Reviewed: Allergy & Precautions, H&P , NPO status , Patient's Chart, lab work & pertinent test results, reviewed documented beta blocker date and time   History of Anesthesia Complications Negative for: history of anesthetic complications  Airway Mallampati: II  TM Distance: >3 FB Neck ROM: full    Dental  (+) Dental Advidsory Given, Teeth Intact, Caps   Pulmonary neg shortness of breath, sleep apnea and Continuous Positive Airway Pressure Ventilation , neg COPD, neg recent URI,           Cardiovascular Exercise Tolerance: Good hypertension, (-) angina(-) CAD, (-) Past MI, (-) Cardiac Stents and (-) CABG (-) dysrhythmias + Valvular Problems/Murmurs      Neuro/Psych neg Seizures PSYCHIATRIC DISORDERS  Neuromuscular disease (fibromyalgia) CVA, Residual Symptoms    GI/Hepatic Neg liver ROS, GERD  ,  Endo/Other  diabetes  Renal/GU CRFRenal disease  negative genitourinary   Musculoskeletal   Abdominal   Peds  Hematology negative hematology ROS (+)   Anesthesia Other Findings Past Medical History: No date: Allergic rhinitis due to pollen No date: Arthritis No date: B12 deficiency No date: Chronic kidney disease No date: Fibromyalgia No date: GERD (gastroesophageal reflux disease) No date: Hemiparesis affecting right side as late effect of  cerebrovascular accident (CVA) ([place]) No date: Hypertension No date: IBS (irritable bowel syndrome)     Comment:  diarrhea prone No date: Iron deficiency anemia No date: Migraine headache No date: Mood disorder ([facility]) No date: Obstructive sleep apnea     Comment:  CPAP 11 (auto titrate) No date: Optic atrophy of right eye No date: PFO (patent foramen ovale) No date: RLS (restless legs syndrome) No date: Sleep disturbance No date: Stroke ([place]) No date: Type 2 diabetes mellitus with  neurological manifestations,  controlled ([facility])   Reproductive/Obstetrics negative OB ROS                            Anesthesia Physical Anesthesia Plan  ASA: III  Anesthesia Plan: General   Post-op Pain Management:    Induction: Intravenous  PONV Risk Score and Plan: 3 and Propofol infusion  Airway Management Planned: Nasal Cannula  Additional Equipment:   Intra-op Plan:   Post-operative Plan:   Informed Consent: I have reviewed the patients History and Physical, chart, labs and discussed the procedure including the risks, benefits and alternatives for the proposed anesthesia with the patient or authorized representative who has indicated his/her understanding and acceptance.   Dental Advisory Given  Plan Discussed with: Anesthesiologist, CRNA and Surgeon  Anesthesia Plan Comments:         Anesthesia Quick Evaluation

## 2018-04-04 NOTE — Op Note (Signed)
[facility] Gastroenterology Patient Name: Anita Greene Procedure Date: 04/04/2018 7:26 AM MRN: [id number] Account #: [id number] Date of Birth: [birth year] Admit Type: Outpatient Age: 67 Room: [facility] ENDO ROOM 1 Gender: Female Note Status: Finalized Procedure:            Upper GI endoscopy Indications:          Dysphagia Providers:            Lollie Sails, MD Referring MD:         Irven Easterly. Kary Kos, MD (Referring MD) Medicines:            Monitored Anesthesia Care Complications:        No immediate complications. Procedure:            Pre-Anesthesia Assessment:                       - ASA Grade Assessment: III - A patient with severe                        systemic disease.                       After obtaining informed consent, the endoscope was                        passed under direct vision. Throughout the procedure,                        the patient's blood pressure, pulse, and oxygen                        saturations were monitored continuously. The Endoscope                        was introduced through the mouth, and advanced to the                        third part of duodenum. The upper GI endoscopy was                        accomplished without difficulty. The patient tolerated                        the procedure well. Findings:      A moderate-sized area of extrinsic compression was found in the lower       third of the esophagus.      The Z-line was variable. Biopsies were taken with a cold forceps for       histology.      A medium-sized paraesophageal hernia was found.      The exam of the stomach was otherwise normal.      Biopsies were taken with a cold forceps in the gastric body and in the       gastric antrum for histology.      The cardia and gastric fundus were normal on retroflexion otherwise.      The examined duodenum was normal. Impression:           - Extrinsic compression in the lower third of the   esophagus.                       -  Z-line variable. Biopsied.                       - Medium-sized paraesophageal hernia.                       - Normal examined duodenum.                       - Biopsies were taken with a cold forceps for histology                        in the gastric body and in the gastric antrum. Recommendation:       - Perform a CT scan (computed tomography) of chest with                        contrast and abdomen with contrast at appointment to be                        scheduled.                       - Await pathology results.                       - Possible referral for surgical opinion re: large                        paraesophageal hernia Procedure Code(s):    --- Professional ---                       [phone], Esophagogastroduodenoscopy, flexible, transoral;                        with biopsy, single or multiple Diagnosis Code(s):    --- Professional ---                       K22.2, Esophageal obstruction                       K22.8, Other specified diseases of esophagus                       K44.9, Diaphragmatic hernia without obstruction or                        gangrene                       R13.10, Dysphagia, unspecified CPT copyright 2017 American Medical Association. All rights reserved. The codes documented in this report are preliminary and upon coder review may  be revised to meet current compliance requirements. Lollie Sails, MD 04/04/2018 8:01:12 AM This report has been signed electronically. Number of Addenda: 0 Note Initiated On: 04/04/2018 7:26 AM      [facility]

## 2018-04-04 NOTE — Anesthesia Post-op Follow-up Note (Signed)
Anesthesia QCDR form completed.        

## 2018-04-05 ENCOUNTER — Encounter: Payer: Self-pay | Admitting: Gastroenterology

## 2018-04-05 LAB — SURGICAL PATHOLOGY

## 2018-04-06 ENCOUNTER — Other Ambulatory Visit: Payer: Self-pay | Admitting: Gastroenterology

## 2018-04-06 DIAGNOSIS — E538 Deficiency of other specified B group vitamins: Secondary | ICD-10-CM | POA: Diagnosis not present

## 2018-04-06 DIAGNOSIS — K449 Diaphragmatic hernia without obstruction or gangrene: Secondary | ICD-10-CM

## 2018-04-07 DIAGNOSIS — R319 Hematuria, unspecified: Secondary | ICD-10-CM | POA: Diagnosis not present

## 2018-04-07 DIAGNOSIS — N183 Chronic kidney disease, stage 3 (moderate): Secondary | ICD-10-CM | POA: Diagnosis not present

## 2018-04-07 DIAGNOSIS — R600 Localized edema: Secondary | ICD-10-CM | POA: Diagnosis not present

## 2018-04-07 DIAGNOSIS — I10 Essential (primary) hypertension: Secondary | ICD-10-CM | POA: Diagnosis not present

## 2018-04-12 ENCOUNTER — Ambulatory Visit
Admission: RE | Admit: 2018-04-12 | Discharge: 2018-04-12 | Disposition: A | Discharge status: Home / Self Care | Payer: PPO | Source: Ambulatory Visit | Attending: Gastroenterology | Admitting: Gastroenterology

## 2018-04-12 DIAGNOSIS — D3502 Benign neoplasm of left adrenal gland: Secondary | ICD-10-CM | POA: Insufficient documentation

## 2018-04-12 DIAGNOSIS — K449 Diaphragmatic hernia without obstruction or gangrene: Secondary | ICD-10-CM | POA: Insufficient documentation

## 2018-04-12 DIAGNOSIS — J9811 Atelectasis: Secondary | ICD-10-CM | POA: Diagnosis not present

## 2018-04-12 LAB — POCT I-STAT CREATININE: CREATININE: 1.3 mg/dL — AB (ref 0.44–1.00)

## 2018-04-12 MED ORDER — IOPAMIDOL (ISOVUE-300) INJECTION 61%: 100.0000 mL | Freq: Once | INTRAVENOUS | Status: DC | PRN

## 2018-04-12 MED ORDER — IOPAMIDOL (ISOVUE-300) INJECTION 61%
85.0000 mL | Freq: Once | INTRAVENOUS | Status: AC | PRN
  Administered 2018-04-12: 85 mL via INTRAVENOUS

## 2018-04-13 DIAGNOSIS — R319 Hematuria, unspecified: Secondary | ICD-10-CM | POA: Diagnosis not present

## 2018-04-13 DIAGNOSIS — N183 Chronic kidney disease, stage 3 (moderate): Secondary | ICD-10-CM | POA: Diagnosis not present

## 2018-04-13 DIAGNOSIS — R600 Localized edema: Secondary | ICD-10-CM | POA: Diagnosis not present

## 2018-04-13 DIAGNOSIS — I10 Essential (primary) hypertension: Secondary | ICD-10-CM | POA: Diagnosis not present

## 2018-04-18 DIAGNOSIS — R197 Diarrhea, unspecified: Secondary | ICD-10-CM | POA: Diagnosis not present

## 2018-04-18 DIAGNOSIS — K219 Gastro-esophageal reflux disease without esophagitis: Secondary | ICD-10-CM | POA: Diagnosis not present

## 2018-04-21 DIAGNOSIS — R600 Localized edema: Secondary | ICD-10-CM | POA: Diagnosis not present

## 2018-04-21 DIAGNOSIS — N183 Chronic kidney disease, stage 3 (moderate): Secondary | ICD-10-CM | POA: Diagnosis not present

## 2018-04-21 DIAGNOSIS — R319 Hematuria, unspecified: Secondary | ICD-10-CM | POA: Diagnosis not present

## 2018-04-21 DIAGNOSIS — I10 Essential (primary) hypertension: Secondary | ICD-10-CM | POA: Diagnosis not present

## 2018-04-26 DIAGNOSIS — G4733 Obstructive sleep apnea (adult) (pediatric): Secondary | ICD-10-CM | POA: Diagnosis not present

## 2018-04-26 DIAGNOSIS — K449 Diaphragmatic hernia without obstruction or gangrene: Secondary | ICD-10-CM | POA: Diagnosis not present

## 2018-04-27 DIAGNOSIS — N183 Chronic kidney disease, stage 3 (moderate): Secondary | ICD-10-CM | POA: Diagnosis not present

## 2018-04-27 DIAGNOSIS — R319 Hematuria, unspecified: Secondary | ICD-10-CM | POA: Diagnosis not present

## 2018-04-27 DIAGNOSIS — R600 Localized edema: Secondary | ICD-10-CM | POA: Diagnosis not present

## 2018-04-27 DIAGNOSIS — I10 Essential (primary) hypertension: Secondary | ICD-10-CM | POA: Diagnosis not present

## 2018-05-30 ENCOUNTER — Other Ambulatory Visit: Payer: Self-pay | Admitting: Internal Medicine

## 2018-06-23 DIAGNOSIS — K449 Diaphragmatic hernia without obstruction or gangrene: Secondary | ICD-10-CM | POA: Diagnosis not present

## 2018-06-23 DIAGNOSIS — Z6837 Body mass index (BMI) 37.0-37.9, adult: Secondary | ICD-10-CM | POA: Diagnosis not present

## 2018-07-13 DIAGNOSIS — Z23 Encounter for immunization: Secondary | ICD-10-CM | POA: Diagnosis not present

## 2018-07-13 DIAGNOSIS — M26622 Arthralgia of left temporomandibular joint: Secondary | ICD-10-CM | POA: Diagnosis not present

## 2018-07-13 DIAGNOSIS — E785 Hyperlipidemia, unspecified: Secondary | ICD-10-CM | POA: Diagnosis not present

## 2018-07-13 DIAGNOSIS — E119 Type 2 diabetes mellitus without complications: Secondary | ICD-10-CM | POA: Diagnosis not present

## 2018-07-13 DIAGNOSIS — Z9989 Dependence on other enabling machines and devices: Secondary | ICD-10-CM | POA: Diagnosis not present

## 2018-07-13 DIAGNOSIS — G4733 Obstructive sleep apnea (adult) (pediatric): Secondary | ICD-10-CM | POA: Diagnosis not present

## 2018-07-13 DIAGNOSIS — I10 Essential (primary) hypertension: Secondary | ICD-10-CM | POA: Diagnosis not present

## 2018-07-13 DIAGNOSIS — K449 Diaphragmatic hernia without obstruction or gangrene: Secondary | ICD-10-CM | POA: Diagnosis not present

## 2018-08-01 DIAGNOSIS — N289 Disorder of kidney and ureter, unspecified: Secondary | ICD-10-CM | POA: Diagnosis not present

## 2018-08-01 DIAGNOSIS — E785 Hyperlipidemia, unspecified: Secondary | ICD-10-CM | POA: Diagnosis not present

## 2018-08-03 DIAGNOSIS — R319 Hematuria, unspecified: Secondary | ICD-10-CM | POA: Diagnosis not present

## 2018-08-03 DIAGNOSIS — R600 Localized edema: Secondary | ICD-10-CM | POA: Diagnosis not present

## 2018-08-03 DIAGNOSIS — N183 Chronic kidney disease, stage 3 (moderate): Secondary | ICD-10-CM | POA: Diagnosis not present

## 2018-08-03 DIAGNOSIS — I10 Essential (primary) hypertension: Secondary | ICD-10-CM | POA: Diagnosis not present

## 2018-08-04 DIAGNOSIS — M8580 Other specified disorders of bone density and structure, unspecified site: Secondary | ICD-10-CM | POA: Diagnosis not present

## 2018-08-04 DIAGNOSIS — E119 Type 2 diabetes mellitus without complications: Secondary | ICD-10-CM | POA: Diagnosis not present

## 2018-08-04 DIAGNOSIS — Z1159 Encounter for screening for other viral diseases: Secondary | ICD-10-CM | POA: Diagnosis not present

## 2018-08-04 DIAGNOSIS — I10 Essential (primary) hypertension: Secondary | ICD-10-CM | POA: Diagnosis not present

## 2018-08-17 DIAGNOSIS — M8580 Other specified disorders of bone density and structure, unspecified site: Secondary | ICD-10-CM | POA: Diagnosis not present

## 2018-08-17 DIAGNOSIS — Z6837 Body mass index (BMI) 37.0-37.9, adult: Secondary | ICD-10-CM | POA: Diagnosis not present

## 2018-08-17 DIAGNOSIS — Z01818 Encounter for other preprocedural examination: Secondary | ICD-10-CM | POA: Diagnosis not present

## 2018-08-17 DIAGNOSIS — K449 Diaphragmatic hernia without obstruction or gangrene: Secondary | ICD-10-CM | POA: Diagnosis not present

## 2018-08-17 DIAGNOSIS — E278 Other specified disorders of adrenal gland: Secondary | ICD-10-CM | POA: Diagnosis not present

## 2018-08-17 DIAGNOSIS — R799 Abnormal finding of blood chemistry, unspecified: Secondary | ICD-10-CM | POA: Diagnosis not present

## 2018-08-17 DIAGNOSIS — Z7189 Other specified counseling: Secondary | ICD-10-CM | POA: Diagnosis not present

## 2018-08-17 DIAGNOSIS — Z79899 Other long term (current) drug therapy: Secondary | ICD-10-CM | POA: Diagnosis not present

## 2018-08-17 DIAGNOSIS — E6609 Other obesity due to excess calories: Secondary | ICD-10-CM | POA: Diagnosis not present

## 2018-08-17 DIAGNOSIS — M797 Fibromyalgia: Secondary | ICD-10-CM | POA: Diagnosis not present

## 2018-08-26 DIAGNOSIS — Z7189 Other specified counseling: Secondary | ICD-10-CM | POA: Diagnosis not present

## 2018-08-26 DIAGNOSIS — Z6837 Body mass index (BMI) 37.0-37.9, adult: Secondary | ICD-10-CM | POA: Diagnosis not present

## 2018-08-26 DIAGNOSIS — K449 Diaphragmatic hernia without obstruction or gangrene: Secondary | ICD-10-CM | POA: Diagnosis not present

## 2018-09-06 ENCOUNTER — Other Ambulatory Visit: Payer: Self-pay | Admitting: Internal Medicine

## 2018-09-13 ENCOUNTER — Other Ambulatory Visit: Payer: Self-pay | Admitting: Internal Medicine

## 2018-09-19 DIAGNOSIS — E119 Type 2 diabetes mellitus without complications: Secondary | ICD-10-CM | POA: Diagnosis not present

## 2018-09-19 DIAGNOSIS — I10 Essential (primary) hypertension: Secondary | ICD-10-CM | POA: Diagnosis not present

## 2018-09-19 DIAGNOSIS — F4321 Adjustment disorder with depressed mood: Secondary | ICD-10-CM | POA: Diagnosis not present

## 2018-09-19 DIAGNOSIS — R000 Tachycardia, unspecified: Secondary | ICD-10-CM | POA: Diagnosis not present

## 2018-10-05 ENCOUNTER — Other Ambulatory Visit: Payer: Self-pay | Admitting: Internal Medicine

## 2018-10-07 DIAGNOSIS — Z7984 Long term (current) use of oral hypoglycemic drugs: Secondary | ICD-10-CM | POA: Diagnosis not present

## 2018-10-07 DIAGNOSIS — Z888 Allergy status to other drugs, medicaments and biological substances status: Secondary | ICD-10-CM | POA: Diagnosis not present

## 2018-10-07 DIAGNOSIS — Z713 Dietary counseling and surveillance: Secondary | ICD-10-CM | POA: Diagnosis not present

## 2018-10-07 DIAGNOSIS — E119 Type 2 diabetes mellitus without complications: Secondary | ICD-10-CM | POA: Diagnosis not present

## 2018-10-07 DIAGNOSIS — Z6835 Body mass index (BMI) 35.0-35.9, adult: Secondary | ICD-10-CM | POA: Diagnosis not present

## 2018-11-09 DIAGNOSIS — E119 Type 2 diabetes mellitus without complications: Secondary | ICD-10-CM | POA: Diagnosis not present

## 2018-11-09 DIAGNOSIS — K449 Diaphragmatic hernia without obstruction or gangrene: Secondary | ICD-10-CM | POA: Diagnosis not present

## 2018-11-09 DIAGNOSIS — Z6835 Body mass index (BMI) 35.0-35.9, adult: Secondary | ICD-10-CM | POA: Diagnosis not present

## 2018-11-09 DIAGNOSIS — Z7189 Other specified counseling: Secondary | ICD-10-CM | POA: Diagnosis not present

## 2018-11-28 DIAGNOSIS — E119 Type 2 diabetes mellitus without complications: Secondary | ICD-10-CM | POA: Diagnosis not present

## 2018-12-05 DIAGNOSIS — K5900 Constipation, unspecified: Secondary | ICD-10-CM | POA: Diagnosis not present

## 2018-12-05 DIAGNOSIS — E119 Type 2 diabetes mellitus without complications: Secondary | ICD-10-CM | POA: Diagnosis not present

## 2018-12-05 DIAGNOSIS — R42 Dizziness and giddiness: Secondary | ICD-10-CM | POA: Diagnosis not present

## 2018-12-05 DIAGNOSIS — G4700 Insomnia, unspecified: Secondary | ICD-10-CM | POA: Diagnosis not present

## 2018-12-05 DIAGNOSIS — I10 Essential (primary) hypertension: Secondary | ICD-10-CM | POA: Diagnosis not present

## 2018-12-05 DIAGNOSIS — F4321 Adjustment disorder with depressed mood: Secondary | ICD-10-CM | POA: Diagnosis not present

## 2018-12-07 DIAGNOSIS — E119 Type 2 diabetes mellitus without complications: Secondary | ICD-10-CM | POA: Diagnosis not present

## 2018-12-07 DIAGNOSIS — Z6835 Body mass index (BMI) 35.0-35.9, adult: Secondary | ICD-10-CM | POA: Diagnosis not present

## 2018-12-07 DIAGNOSIS — Z7984 Long term (current) use of oral hypoglycemic drugs: Secondary | ICD-10-CM | POA: Diagnosis not present

## 2018-12-07 DIAGNOSIS — I10 Essential (primary) hypertension: Secondary | ICD-10-CM | POA: Diagnosis not present

## 2018-12-07 DIAGNOSIS — Z885 Allergy status to narcotic agent status: Secondary | ICD-10-CM | POA: Diagnosis not present

## 2018-12-07 DIAGNOSIS — Z881 Allergy status to other antibiotic agents status: Secondary | ICD-10-CM | POA: Diagnosis not present

## 2018-12-07 DIAGNOSIS — Z882 Allergy status to sulfonamides status: Secondary | ICD-10-CM | POA: Diagnosis not present

## 2018-12-07 DIAGNOSIS — Z8673 Personal history of transient ischemic attack (TIA), and cerebral infarction without residual deficits: Secondary | ICD-10-CM | POA: Diagnosis not present

## 2018-12-12 ENCOUNTER — Other Ambulatory Visit: Payer: Self-pay | Admitting: Internal Medicine

## 2018-12-28 DIAGNOSIS — K449 Diaphragmatic hernia without obstruction or gangrene: Secondary | ICD-10-CM | POA: Diagnosis not present

## 2018-12-28 DIAGNOSIS — Z7189 Other specified counseling: Secondary | ICD-10-CM | POA: Diagnosis not present

## 2018-12-28 DIAGNOSIS — Z6835 Body mass index (BMI) 35.0-35.9, adult: Secondary | ICD-10-CM | POA: Diagnosis not present

## 2019-01-04 DIAGNOSIS — F54 Psychological and behavioral factors associated with disorders or diseases classified elsewhere: Secondary | ICD-10-CM | POA: Diagnosis not present

## 2019-01-05 DIAGNOSIS — I10 Essential (primary) hypertension: Secondary | ICD-10-CM | POA: Diagnosis not present

## 2019-01-25 DIAGNOSIS — Z6835 Body mass index (BMI) 35.0-35.9, adult: Secondary | ICD-10-CM | POA: Diagnosis not present

## 2019-01-25 DIAGNOSIS — E118 Type 2 diabetes mellitus with unspecified complications: Secondary | ICD-10-CM | POA: Diagnosis not present

## 2019-03-17 DIAGNOSIS — Z01810 Encounter for preprocedural cardiovascular examination: Secondary | ICD-10-CM | POA: Diagnosis not present

## 2019-03-17 DIAGNOSIS — Z01818 Encounter for other preprocedural examination: Secondary | ICD-10-CM | POA: Diagnosis not present

## 2019-03-17 DIAGNOSIS — I10 Essential (primary) hypertension: Secondary | ICD-10-CM | POA: Diagnosis not present

## 2019-03-22 DIAGNOSIS — Z01810 Encounter for preprocedural cardiovascular examination: Secondary | ICD-10-CM | POA: Diagnosis not present

## 2019-03-24 DIAGNOSIS — K449 Diaphragmatic hernia without obstruction or gangrene: Secondary | ICD-10-CM | POA: Diagnosis not present

## 2019-03-27 DIAGNOSIS — I10 Essential (primary) hypertension: Secondary | ICD-10-CM | POA: Diagnosis not present

## 2019-03-27 DIAGNOSIS — R0789 Other chest pain: Secondary | ICD-10-CM | POA: Diagnosis not present

## 2019-04-04 DIAGNOSIS — R319 Hematuria, unspecified: Secondary | ICD-10-CM | POA: Diagnosis not present

## 2019-04-04 DIAGNOSIS — I10 Essential (primary) hypertension: Secondary | ICD-10-CM | POA: Diagnosis not present

## 2019-04-04 DIAGNOSIS — N183 Chronic kidney disease, stage 3 (moderate): Secondary | ICD-10-CM | POA: Diagnosis not present

## 2019-04-04 DIAGNOSIS — R600 Localized edema: Secondary | ICD-10-CM | POA: Diagnosis not present

## 2019-04-06 DIAGNOSIS — N183 Chronic kidney disease, stage 3 (moderate): Secondary | ICD-10-CM | POA: Diagnosis not present

## 2019-04-06 DIAGNOSIS — R600 Localized edema: Secondary | ICD-10-CM | POA: Diagnosis not present

## 2019-04-06 DIAGNOSIS — I129 Hypertensive chronic kidney disease with stage 1 through stage 4 chronic kidney disease, or unspecified chronic kidney disease: Secondary | ICD-10-CM | POA: Diagnosis not present

## 2019-04-11 DIAGNOSIS — M797 Fibromyalgia: Secondary | ICD-10-CM | POA: Diagnosis not present

## 2019-04-11 DIAGNOSIS — E1122 Type 2 diabetes mellitus with diabetic chronic kidney disease: Secondary | ICD-10-CM | POA: Diagnosis not present

## 2019-04-11 DIAGNOSIS — I10 Essential (primary) hypertension: Secondary | ICD-10-CM | POA: Diagnosis not present

## 2019-04-11 DIAGNOSIS — K449 Diaphragmatic hernia without obstruction or gangrene: Secondary | ICD-10-CM | POA: Diagnosis not present

## 2019-04-11 DIAGNOSIS — E1149 Type 2 diabetes mellitus with other diabetic neurological complication: Secondary | ICD-10-CM | POA: Diagnosis not present

## 2019-04-11 DIAGNOSIS — G4733 Obstructive sleep apnea (adult) (pediatric): Secondary | ICD-10-CM | POA: Diagnosis not present

## 2019-04-11 DIAGNOSIS — Z1159 Encounter for screening for other viral diseases: Secondary | ICD-10-CM | POA: Diagnosis not present

## 2019-04-11 DIAGNOSIS — I69351 Hemiplegia and hemiparesis following cerebral infarction affecting right dominant side: Secondary | ICD-10-CM | POA: Diagnosis not present

## 2019-04-11 DIAGNOSIS — K210 Gastro-esophageal reflux disease with esophagitis: Secondary | ICD-10-CM | POA: Diagnosis not present

## 2019-04-11 DIAGNOSIS — D509 Iron deficiency anemia, unspecified: Secondary | ICD-10-CM | POA: Diagnosis not present

## 2019-04-14 DIAGNOSIS — F419 Anxiety disorder, unspecified: Secondary | ICD-10-CM | POA: Diagnosis not present

## 2019-04-14 DIAGNOSIS — E119 Type 2 diabetes mellitus without complications: Secondary | ICD-10-CM | POA: Diagnosis not present

## 2019-04-14 DIAGNOSIS — Z6835 Body mass index (BMI) 35.0-35.9, adult: Secondary | ICD-10-CM | POA: Diagnosis not present

## 2019-04-14 DIAGNOSIS — Z9049 Acquired absence of other specified parts of digestive tract: Secondary | ICD-10-CM | POA: Diagnosis not present

## 2019-04-14 DIAGNOSIS — Z9884 Bariatric surgery status: Secondary | ICD-10-CM | POA: Diagnosis not present

## 2019-04-14 DIAGNOSIS — I129 Hypertensive chronic kidney disease with stage 1 through stage 4 chronic kidney disease, or unspecified chronic kidney disease: Secondary | ICD-10-CM | POA: Diagnosis not present

## 2019-04-14 DIAGNOSIS — K449 Diaphragmatic hernia without obstruction or gangrene: Secondary | ICD-10-CM | POA: Diagnosis not present

## 2019-04-14 DIAGNOSIS — Z7982 Long term (current) use of aspirin: Secondary | ICD-10-CM | POA: Diagnosis not present

## 2019-04-14 DIAGNOSIS — E1149 Type 2 diabetes mellitus with other diabetic neurological complication: Secondary | ICD-10-CM | POA: Diagnosis not present

## 2019-04-14 DIAGNOSIS — Z6834 Body mass index (BMI) 34.0-34.9, adult: Secondary | ICD-10-CM | POA: Diagnosis not present

## 2019-04-14 DIAGNOSIS — Z882 Allergy status to sulfonamides status: Secondary | ICD-10-CM | POA: Diagnosis not present

## 2019-04-14 DIAGNOSIS — K2950 Unspecified chronic gastritis without bleeding: Secondary | ICD-10-CM | POA: Diagnosis not present

## 2019-04-14 DIAGNOSIS — Z7984 Long term (current) use of oral hypoglycemic drugs: Secondary | ICD-10-CM | POA: Diagnosis not present

## 2019-04-14 DIAGNOSIS — Z1159 Encounter for screening for other viral diseases: Secondary | ICD-10-CM | POA: Diagnosis not present

## 2019-04-14 DIAGNOSIS — N189 Chronic kidney disease, unspecified: Secondary | ICD-10-CM | POA: Diagnosis not present

## 2019-04-14 DIAGNOSIS — E1122 Type 2 diabetes mellitus with diabetic chronic kidney disease: Secondary | ICD-10-CM | POA: Diagnosis not present

## 2019-04-14 DIAGNOSIS — G4733 Obstructive sleep apnea (adult) (pediatric): Secondary | ICD-10-CM | POA: Diagnosis not present

## 2019-04-14 DIAGNOSIS — Z79899 Other long term (current) drug therapy: Secondary | ICD-10-CM | POA: Diagnosis not present

## 2019-04-14 DIAGNOSIS — F329 Major depressive disorder, single episode, unspecified: Secondary | ICD-10-CM | POA: Diagnosis not present

## 2019-04-14 DIAGNOSIS — K660 Peritoneal adhesions (postprocedural) (postinfection): Secondary | ICD-10-CM | POA: Diagnosis not present

## 2019-04-14 DIAGNOSIS — E785 Hyperlipidemia, unspecified: Secondary | ICD-10-CM | POA: Diagnosis not present

## 2019-04-14 DIAGNOSIS — K219 Gastro-esophageal reflux disease without esophagitis: Secondary | ICD-10-CM | POA: Diagnosis not present

## 2019-04-14 DIAGNOSIS — Z8673 Personal history of transient ischemic attack (TIA), and cerebral infarction without residual deficits: Secondary | ICD-10-CM | POA: Diagnosis not present

## 2019-07-04 DIAGNOSIS — I10 Essential (primary) hypertension: Secondary | ICD-10-CM | POA: Diagnosis not present

## 2019-07-04 DIAGNOSIS — E119 Type 2 diabetes mellitus without complications: Secondary | ICD-10-CM | POA: Diagnosis not present

## 2019-07-10 DIAGNOSIS — R000 Tachycardia, unspecified: Secondary | ICD-10-CM | POA: Diagnosis not present

## 2019-07-10 DIAGNOSIS — M797 Fibromyalgia: Secondary | ICD-10-CM | POA: Diagnosis not present

## 2019-07-10 DIAGNOSIS — E785 Hyperlipidemia, unspecified: Secondary | ICD-10-CM | POA: Diagnosis not present

## 2019-07-10 DIAGNOSIS — Z23 Encounter for immunization: Secondary | ICD-10-CM | POA: Diagnosis not present

## 2019-07-10 DIAGNOSIS — I10 Essential (primary) hypertension: Secondary | ICD-10-CM | POA: Diagnosis not present

## 2019-07-10 DIAGNOSIS — E119 Type 2 diabetes mellitus without complications: Secondary | ICD-10-CM | POA: Diagnosis not present

## 2019-07-10 DIAGNOSIS — Z0000 Encounter for general adult medical examination without abnormal findings: Secondary | ICD-10-CM | POA: Diagnosis not present

## 2019-09-26 ENCOUNTER — Ambulatory Visit: Payer: PPO | Attending: Otolaryngology

## 2019-09-26 DIAGNOSIS — G4761 Periodic limb movement disorder: Secondary | ICD-10-CM | POA: Insufficient documentation

## 2019-09-26 DIAGNOSIS — R0602 Shortness of breath: Secondary | ICD-10-CM | POA: Diagnosis not present

## 2019-09-26 DIAGNOSIS — I10 Essential (primary) hypertension: Secondary | ICD-10-CM | POA: Diagnosis not present

## 2019-09-26 DIAGNOSIS — G4733 Obstructive sleep apnea (adult) (pediatric): Secondary | ICD-10-CM | POA: Insufficient documentation

## 2019-09-26 DIAGNOSIS — R634 Abnormal weight loss: Secondary | ICD-10-CM | POA: Diagnosis not present

## 2019-09-26 DIAGNOSIS — E7849 Other hyperlipidemia: Secondary | ICD-10-CM | POA: Diagnosis not present

## 2019-09-27 ENCOUNTER — Other Ambulatory Visit: Payer: Self-pay

## 2019-09-27 DIAGNOSIS — G4730 Sleep apnea, unspecified: Secondary | ICD-10-CM | POA: Diagnosis not present

## 2019-10-09 DIAGNOSIS — N183 Chronic kidney disease, stage 3 unspecified: Secondary | ICD-10-CM | POA: Diagnosis not present

## 2019-10-09 DIAGNOSIS — R600 Localized edema: Secondary | ICD-10-CM | POA: Diagnosis not present

## 2019-10-09 DIAGNOSIS — I10 Essential (primary) hypertension: Secondary | ICD-10-CM | POA: Diagnosis not present

## 2019-10-09 DIAGNOSIS — R310 Gross hematuria: Secondary | ICD-10-CM | POA: Diagnosis not present

## 2019-10-10 DIAGNOSIS — Z6828 Body mass index (BMI) 28.0-28.9, adult: Secondary | ICD-10-CM | POA: Diagnosis not present

## 2019-10-10 DIAGNOSIS — Z713 Dietary counseling and surveillance: Secondary | ICD-10-CM | POA: Diagnosis not present

## 2019-10-10 DIAGNOSIS — G4733 Obstructive sleep apnea (adult) (pediatric): Secondary | ICD-10-CM | POA: Diagnosis not present

## 2019-10-10 DIAGNOSIS — E1149 Type 2 diabetes mellitus with other diabetic neurological complication: Secondary | ICD-10-CM | POA: Diagnosis not present

## 2019-10-10 DIAGNOSIS — Z9884 Bariatric surgery status: Secondary | ICD-10-CM | POA: Diagnosis not present

## 2019-10-12 DIAGNOSIS — N1831 Chronic kidney disease, stage 3a: Secondary | ICD-10-CM | POA: Diagnosis not present

## 2019-10-12 DIAGNOSIS — I10 Essential (primary) hypertension: Secondary | ICD-10-CM | POA: Diagnosis not present

## 2019-10-12 DIAGNOSIS — N3000 Acute cystitis without hematuria: Secondary | ICD-10-CM | POA: Diagnosis not present

## 2019-10-12 DIAGNOSIS — R600 Localized edema: Secondary | ICD-10-CM | POA: Diagnosis not present

## 2019-10-27 HISTORY — PX: ROUX-EN-Y GASTRIC BYPASS: SHX1104

## 2019-10-27 HISTORY — PX: NISSEN FUNDOPLICATION: SHX2091

## 2019-11-23 DIAGNOSIS — M62830 Muscle spasm of back: Secondary | ICD-10-CM | POA: Diagnosis not present

## 2019-11-23 DIAGNOSIS — F419 Anxiety disorder, unspecified: Secondary | ICD-10-CM | POA: Diagnosis not present

## 2019-11-23 DIAGNOSIS — I10 Essential (primary) hypertension: Secondary | ICD-10-CM | POA: Diagnosis not present

## 2019-11-23 DIAGNOSIS — R000 Tachycardia, unspecified: Secondary | ICD-10-CM | POA: Diagnosis not present

## 2019-11-23 DIAGNOSIS — E119 Type 2 diabetes mellitus without complications: Secondary | ICD-10-CM | POA: Diagnosis not present

## 2019-11-23 DIAGNOSIS — K580 Irritable bowel syndrome with diarrhea: Secondary | ICD-10-CM | POA: Diagnosis not present

## 2019-12-20 DIAGNOSIS — H353132 Nonexudative age-related macular degeneration, bilateral, intermediate dry stage: Secondary | ICD-10-CM | POA: Diagnosis not present

## 2020-02-21 DIAGNOSIS — E119 Type 2 diabetes mellitus without complications: Secondary | ICD-10-CM | POA: Diagnosis not present

## 2020-02-29 DIAGNOSIS — N1832 Chronic kidney disease, stage 3b: Secondary | ICD-10-CM | POA: Diagnosis not present

## 2020-02-29 DIAGNOSIS — I10 Essential (primary) hypertension: Secondary | ICD-10-CM | POA: Diagnosis not present

## 2020-02-29 DIAGNOSIS — R7989 Other specified abnormal findings of blood chemistry: Secondary | ICD-10-CM | POA: Diagnosis not present

## 2020-02-29 DIAGNOSIS — F419 Anxiety disorder, unspecified: Secondary | ICD-10-CM | POA: Diagnosis not present

## 2020-02-29 DIAGNOSIS — E119 Type 2 diabetes mellitus without complications: Secondary | ICD-10-CM | POA: Diagnosis not present

## 2020-04-01 DIAGNOSIS — R7989 Other specified abnormal findings of blood chemistry: Secondary | ICD-10-CM | POA: Diagnosis not present

## 2020-04-15 DIAGNOSIS — R7989 Other specified abnormal findings of blood chemistry: Secondary | ICD-10-CM | POA: Diagnosis not present

## 2020-05-01 DIAGNOSIS — N1831 Chronic kidney disease, stage 3a: Secondary | ICD-10-CM | POA: Diagnosis not present

## 2020-05-06 DIAGNOSIS — N390 Urinary tract infection, site not specified: Secondary | ICD-10-CM | POA: Diagnosis not present

## 2020-05-06 DIAGNOSIS — I10 Essential (primary) hypertension: Secondary | ICD-10-CM | POA: Diagnosis not present

## 2020-05-06 DIAGNOSIS — R600 Localized edema: Secondary | ICD-10-CM | POA: Diagnosis not present

## 2020-05-06 DIAGNOSIS — N1831 Chronic kidney disease, stage 3a: Secondary | ICD-10-CM | POA: Diagnosis not present

## 2020-07-24 ENCOUNTER — Other Ambulatory Visit: Payer: Self-pay | Admitting: Family Medicine

## 2020-07-24 DIAGNOSIS — Z1231 Encounter for screening mammogram for malignant neoplasm of breast: Secondary | ICD-10-CM

## 2020-08-16 ENCOUNTER — Other Ambulatory Visit: Payer: Self-pay

## 2020-08-16 ENCOUNTER — Ambulatory Visit
Admission: RE | Admit: 2020-08-16 | Discharge: 2020-08-16 | Disposition: A | Discharge status: Home / Self Care | Payer: PPO | Source: Ambulatory Visit | Attending: Family Medicine | Admitting: Family Medicine

## 2020-08-16 DIAGNOSIS — Z1231 Encounter for screening mammogram for malignant neoplasm of breast: Secondary | ICD-10-CM | POA: Diagnosis not present

## 2020-11-18 ENCOUNTER — Other Ambulatory Visit: Payer: PPO

## 2020-11-18 DIAGNOSIS — Z20822 Contact with and (suspected) exposure to covid-19: Secondary | ICD-10-CM | POA: Diagnosis not present

## 2020-11-19 LAB — NOVEL CORONAVIRUS, NAA: SARS-CoV-2, NAA: NOT DETECTED

## 2020-11-19 LAB — SARS-COV-2, NAA 2 DAY TAT

## 2020-11-27 DIAGNOSIS — Z0000 Encounter for general adult medical examination without abnormal findings: Secondary | ICD-10-CM | POA: Diagnosis not present

## 2021-04-01 DIAGNOSIS — I10 Essential (primary) hypertension: Secondary | ICD-10-CM | POA: Diagnosis not present

## 2021-04-01 DIAGNOSIS — E119 Type 2 diabetes mellitus without complications: Secondary | ICD-10-CM | POA: Diagnosis not present

## 2021-04-01 DIAGNOSIS — R8290 Unspecified abnormal findings in urine: Secondary | ICD-10-CM | POA: Diagnosis not present

## 2021-04-09 DIAGNOSIS — D649 Anemia, unspecified: Secondary | ICD-10-CM | POA: Diagnosis not present

## 2021-04-09 DIAGNOSIS — F39 Unspecified mood [affective] disorder: Secondary | ICD-10-CM | POA: Diagnosis not present

## 2021-04-09 DIAGNOSIS — E7849 Other hyperlipidemia: Secondary | ICD-10-CM | POA: Diagnosis not present

## 2021-04-09 DIAGNOSIS — N184 Chronic kidney disease, stage 4 (severe): Secondary | ICD-10-CM | POA: Diagnosis not present

## 2021-04-09 DIAGNOSIS — Z0000 Encounter for general adult medical examination without abnormal findings: Secondary | ICD-10-CM | POA: Diagnosis not present

## 2021-04-09 DIAGNOSIS — K580 Irritable bowel syndrome with diarrhea: Secondary | ICD-10-CM | POA: Diagnosis not present

## 2021-04-09 DIAGNOSIS — R251 Tremor, unspecified: Secondary | ICD-10-CM | POA: Diagnosis not present

## 2021-04-09 DIAGNOSIS — E1122 Type 2 diabetes mellitus with diabetic chronic kidney disease: Secondary | ICD-10-CM | POA: Diagnosis not present

## 2021-04-09 DIAGNOSIS — E119 Type 2 diabetes mellitus without complications: Secondary | ICD-10-CM | POA: Diagnosis not present

## 2021-04-09 DIAGNOSIS — I10 Essential (primary) hypertension: Secondary | ICD-10-CM | POA: Diagnosis not present

## 2021-04-17 ENCOUNTER — Other Ambulatory Visit: Payer: Self-pay | Admitting: Internal Medicine

## 2021-05-15 DIAGNOSIS — N1831 Chronic kidney disease, stage 3a: Secondary | ICD-10-CM | POA: Diagnosis not present

## 2021-05-15 DIAGNOSIS — E119 Type 2 diabetes mellitus without complications: Secondary | ICD-10-CM | POA: Diagnosis not present

## 2021-05-22 DIAGNOSIS — I10 Essential (primary) hypertension: Secondary | ICD-10-CM | POA: Diagnosis not present

## 2021-05-22 DIAGNOSIS — E119 Type 2 diabetes mellitus without complications: Secondary | ICD-10-CM | POA: Diagnosis not present

## 2021-05-22 DIAGNOSIS — Z23 Encounter for immunization: Secondary | ICD-10-CM | POA: Diagnosis not present

## 2021-05-22 DIAGNOSIS — E538 Deficiency of other specified B group vitamins: Secondary | ICD-10-CM | POA: Diagnosis not present

## 2021-05-22 DIAGNOSIS — N184 Chronic kidney disease, stage 4 (severe): Secondary | ICD-10-CM | POA: Diagnosis not present

## 2021-05-22 DIAGNOSIS — M545 Low back pain, unspecified: Secondary | ICD-10-CM | POA: Diagnosis not present

## 2021-05-22 DIAGNOSIS — S61212A Laceration without foreign body of right middle finger without damage to nail, initial encounter: Secondary | ICD-10-CM | POA: Diagnosis not present

## 2021-06-12 DIAGNOSIS — I10 Essential (primary) hypertension: Secondary | ICD-10-CM | POA: Diagnosis not present

## 2021-06-12 DIAGNOSIS — R600 Localized edema: Secondary | ICD-10-CM | POA: Diagnosis not present

## 2021-06-12 DIAGNOSIS — N1831 Chronic kidney disease, stage 3a: Secondary | ICD-10-CM | POA: Diagnosis not present

## 2021-06-12 DIAGNOSIS — N390 Urinary tract infection, site not specified: Secondary | ICD-10-CM | POA: Diagnosis not present

## 2021-06-19 ENCOUNTER — Other Ambulatory Visit (HOSPITAL_BASED_OUTPATIENT_CLINIC_OR_DEPARTMENT_OTHER): Payer: Self-pay | Admitting: Nephrology

## 2021-06-19 ENCOUNTER — Other Ambulatory Visit: Payer: Self-pay | Admitting: Nephrology

## 2021-06-19 DIAGNOSIS — N179 Acute kidney failure, unspecified: Secondary | ICD-10-CM | POA: Diagnosis not present

## 2021-06-19 DIAGNOSIS — R600 Localized edema: Secondary | ICD-10-CM | POA: Diagnosis not present

## 2021-06-19 DIAGNOSIS — N184 Chronic kidney disease, stage 4 (severe): Secondary | ICD-10-CM | POA: Diagnosis not present

## 2021-06-19 DIAGNOSIS — I10 Essential (primary) hypertension: Secondary | ICD-10-CM | POA: Diagnosis not present

## 2021-07-04 ENCOUNTER — Ambulatory Visit
Admission: RE | Admit: 2021-07-04 | Discharge: 2021-07-04 | Disposition: A | Discharge status: Home / Self Care | Payer: PPO | Source: Ambulatory Visit | Attending: Nephrology | Admitting: Nephrology

## 2021-07-04 ENCOUNTER — Other Ambulatory Visit: Payer: Self-pay

## 2021-07-04 DIAGNOSIS — N184 Chronic kidney disease, stage 4 (severe): Secondary | ICD-10-CM | POA: Diagnosis not present

## 2021-07-04 DIAGNOSIS — N179 Acute kidney failure, unspecified: Secondary | ICD-10-CM | POA: Diagnosis not present

## 2021-07-04 DIAGNOSIS — R188 Other ascites: Secondary | ICD-10-CM | POA: Diagnosis not present

## 2021-07-04 DIAGNOSIS — N200 Calculus of kidney: Secondary | ICD-10-CM | POA: Diagnosis not present

## 2021-07-24 DIAGNOSIS — N184 Chronic kidney disease, stage 4 (severe): Secondary | ICD-10-CM | POA: Diagnosis not present

## 2021-07-24 DIAGNOSIS — R8290 Unspecified abnormal findings in urine: Secondary | ICD-10-CM | POA: Diagnosis not present

## 2021-07-24 DIAGNOSIS — E538 Deficiency of other specified B group vitamins: Secondary | ICD-10-CM | POA: Diagnosis not present

## 2021-07-24 DIAGNOSIS — N179 Acute kidney failure, unspecified: Secondary | ICD-10-CM | POA: Diagnosis not present

## 2021-07-31 DIAGNOSIS — E875 Hyperkalemia: Secondary | ICD-10-CM | POA: Diagnosis not present

## 2021-07-31 DIAGNOSIS — I10 Essential (primary) hypertension: Secondary | ICD-10-CM | POA: Diagnosis not present

## 2021-07-31 DIAGNOSIS — N179 Acute kidney failure, unspecified: Secondary | ICD-10-CM | POA: Diagnosis not present

## 2021-07-31 DIAGNOSIS — N184 Chronic kidney disease, stage 4 (severe): Secondary | ICD-10-CM | POA: Diagnosis not present

## 2021-07-31 DIAGNOSIS — R600 Localized edema: Secondary | ICD-10-CM | POA: Diagnosis not present

## 2021-08-01 ENCOUNTER — Other Ambulatory Visit: Payer: Self-pay | Admitting: Nephrology

## 2021-08-01 DIAGNOSIS — N179 Acute kidney failure, unspecified: Secondary | ICD-10-CM

## 2021-08-01 DIAGNOSIS — N184 Chronic kidney disease, stage 4 (severe): Secondary | ICD-10-CM

## 2021-08-06 ENCOUNTER — Other Ambulatory Visit: Payer: Self-pay | Admitting: Radiology

## 2021-08-11 ENCOUNTER — Ambulatory Visit
Admission: RE | Admit: 2021-08-11 | Discharge: 2021-08-11 | Disposition: A | Discharge status: Home / Self Care | Payer: PPO | Source: Ambulatory Visit | Attending: Nephrology | Admitting: Nephrology

## 2021-08-11 ENCOUNTER — Other Ambulatory Visit: Payer: Self-pay

## 2021-08-11 DIAGNOSIS — N184 Chronic kidney disease, stage 4 (severe): Secondary | ICD-10-CM | POA: Diagnosis not present

## 2021-08-11 DIAGNOSIS — E1122 Type 2 diabetes mellitus with diabetic chronic kidney disease: Secondary | ICD-10-CM | POA: Insufficient documentation

## 2021-08-11 DIAGNOSIS — Z7982 Long term (current) use of aspirin: Secondary | ICD-10-CM | POA: Insufficient documentation

## 2021-08-11 DIAGNOSIS — N179 Acute kidney failure, unspecified: Secondary | ICD-10-CM

## 2021-08-11 DIAGNOSIS — Z882 Allergy status to sulfonamides status: Secondary | ICD-10-CM | POA: Insufficient documentation

## 2021-08-11 DIAGNOSIS — N170 Acute kidney failure with tubular necrosis: Secondary | ICD-10-CM | POA: Insufficient documentation

## 2021-08-11 DIAGNOSIS — I129 Hypertensive chronic kidney disease with stage 1 through stage 4 chronic kidney disease, or unspecified chronic kidney disease: Secondary | ICD-10-CM | POA: Insufficient documentation

## 2021-08-11 DIAGNOSIS — Z7984 Long term (current) use of oral hypoglycemic drugs: Secondary | ICD-10-CM | POA: Diagnosis not present

## 2021-08-11 DIAGNOSIS — Z888 Allergy status to other drugs, medicaments and biological substances status: Secondary | ICD-10-CM | POA: Insufficient documentation

## 2021-08-11 DIAGNOSIS — Z885 Allergy status to narcotic agent status: Secondary | ICD-10-CM | POA: Diagnosis not present

## 2021-08-11 DIAGNOSIS — Z881 Allergy status to other antibiotic agents status: Secondary | ICD-10-CM | POA: Diagnosis not present

## 2021-08-11 DIAGNOSIS — Z79899 Other long term (current) drug therapy: Secondary | ICD-10-CM | POA: Diagnosis not present

## 2021-08-11 LAB — CBC
HCT: 27.4 % — ABNORMAL LOW (ref 36.0–46.0)
Hemoglobin: 8.6 g/dL — ABNORMAL LOW (ref 12.0–15.0)
MCH: 30.3 pg (ref 26.0–34.0)
MCHC: 31.4 g/dL (ref 30.0–36.0)
MCV: 96.5 fL (ref 80.0–100.0)
Platelets: 334 10*3/uL (ref 150–400)
RBC: 2.84 MIL/uL — ABNORMAL LOW (ref 3.87–5.11)
RDW: 14.2 % (ref 11.5–15.5)
WBC: 14.5 10*3/uL — ABNORMAL HIGH (ref 4.0–10.5)
nRBC: 0 % (ref 0.0–0.2)

## 2021-08-11 LAB — PROTIME-INR
INR: 1 (ref 0.8–1.2)
Prothrombin Time: 13.6 seconds (ref 11.4–15.2)

## 2021-08-11 LAB — GLUCOSE, CAPILLARY
Glucose-Capillary: 79 mg/dL (ref 70–99)
Glucose-Capillary: 99 mg/dL (ref 70–99)

## 2021-08-11 MED ORDER — FENTANYL CITRATE (PF) 100 MCG/2ML IJ SOLN
INTRAMUSCULAR | Status: DC | PRN
  Administered 2021-08-11 (×2): 50 ug via INTRAVENOUS

## 2021-08-11 MED ORDER — SODIUM CHLORIDE 0.9 % IV SOLN: INTRAVENOUS | Status: DC

## 2021-08-11 MED ORDER — MIDAZOLAM HCL 2 MG/2ML IJ SOLN
INTRAMUSCULAR | Status: DC | PRN
  Administered 2021-08-11 (×2): 1 mg via INTRAVENOUS

## 2021-08-11 MED ORDER — MIDAZOLAM HCL 2 MG/2ML IJ SOLN
INTRAMUSCULAR | Status: AC
  Filled 2021-08-11: qty 2

## 2021-08-11 MED ORDER — FENTANYL CITRATE (PF) 100 MCG/2ML IJ SOLN
INTRAMUSCULAR | Status: AC
  Filled 2021-08-11: qty 2

## 2021-08-11 NOTE — Procedures (Signed)
Interventional Radiology Procedure Note  Procedure: Ultrasound guided renal biopsy  Findings: Please refer to procedural dictation for full description. 16 ga core x2.  Gelfoam needle track embolization.  Complications: None immediate  Estimated Blood Loss: < 5 mL  Recommendations: Strict 3 hour bedrest. Follow up Pathology results.   Ruthann Cancer, MD

## 2021-08-11 NOTE — H&P (Signed)
Chief Complaint: Patient was seen in consultation today for chronic kidney disease at the request of Singh,Harmeet  Referring Physician(s): Singh,Harmeet  Supervising Physician: Ruthann Cancer  Patient Status: [place] - Out-pt  History of Present Illness: Anita Greene is a 70 y.o. female who follows with Nephrology, last seen 10/6- no medical changes since this visit. Acute on CKD stage IV, request received for image guided non target renal biopsy. The patient has had a H&P performed within the last 30 days, all history, medications, and exam have been reviewed. The patient denies any interval changes since the H&P.  The patient denies any current flank pain, nausea or vomiting, denies any chest pain, shortness of breath or palpitations. The patient denies any recent fever or chills. The patient does have a CPAP but does not use. She has no known complications to sedation.    Past Medical History:  Diagnosis Date   Allergic rhinitis due to pollen    Arthritis    B12 deficiency    Chronic kidney disease    Fibromyalgia    GERD (gastroesophageal reflux disease)    Hemiparesis affecting right side as late effect of cerebrovascular accident (CVA) (HCC)    Hypertension    IBS (irritable bowel syndrome)    diarrhea prone   Iron deficiency anemia    Migraine headache    Mood disorder (HCC)    Obstructive sleep apnea    CPAP 11 (auto titrate)   Optic atrophy of right eye    PFO (patent foramen ovale)    RLS (restless legs syndrome)    Sleep disturbance    Stroke ([place])    Type 2 diabetes mellitus with neurological manifestations, controlled ([place])     Past Surgical History:  Procedure Laterality Date   CATARACT EXTRACTION W/ INTRAOCULAR LENS  IMPLANT, BILATERAL     CHOLECYSTECTOMY     COLONOSCOPY     COLONOSCOPY WITH PROPOFOL N/A 04/04/2018   Procedure: COLONOSCOPY WITH PROPOFOL;  Surgeon: Lollie Sails, MD;  Location: [facility] ENDOSCOPY;  Service: Endoscopy;  Laterality:  N/A;   ESOPHAGOGASTRODUODENOSCOPY (EGD) WITH PROPOFOL N/A 04/04/2018   Procedure: ESOPHAGOGASTRODUODENOSCOPY (EGD) WITH PROPOFOL;  Surgeon: Lollie Sails, MD;  Location: [facility] ENDOSCOPY;  Service: Endoscopy;  Laterality: N/A;   HERNIA REPAIR     NISSEN FUNDOPLICATION     TONSILLECTOMY     TUBAL LIGATION     UPPER GASTROINTESTINAL ENDOSCOPY     Uroplasty  ~2000's   Dr Bernardo Heater    Allergies: Cefditoren pivoxil, Clarithromycin, Codeine sulfate, Erythromycin, Other, Pseudoephedrine, and Sulfa antibiotics  Medications: Prior to Admission medications   Medication Sig Start Date End Date Taking? Authorizing Provider  ALPRAZolam Duanne Moron) 0.5 MG tablet TAKE ONE TABLET BY MOUTH TWICE DAILY AS NEEDED 09/10/17   Venia Carbon, MD  ascorbic acid (VITAMIN C) 500 MG tablet Take 500 mg by mouth daily.    [provider]  aspirin EC 81 MG tablet Take 81 mg by mouth daily.     [provider]  cetirizine (ZYRTEC) 10 MG tablet Take 1 tablet (10 mg total) by mouth daily. 06/19/16   Venia Carbon, MD  cyclobenzaprine (FLEXERIL) 10 MG tablet TAKE 1 TABLET BY MOUTH 3 TIMES DAILY AS NEEDED 07/16/17   Viviana Simpler I, MD  diclofenac sodium (VOLTAREN) 1 % GEL APPLY 2 GRAMS TO THE SHOULDER UP TO 4 TIMES DAILY AS NEEDED 01/17/18   Venia Carbon, MD  diphenoxylate-atropine (LOMOTIL) 2.5-0.025 MG tablet TAKE 1 TABLET BY  MOUTH 4 TIMES DAILY AS NEEDED FOR DIARRHEA OR LOOSE STOOLS. 09/10/17   Venia Carbon, MD  DULoxetine (CYMBALTA) 30 MG capsule Take 1 capsule (30 mg total) by mouth daily. 06/15/17   Venia Carbon, MD  ferrous sulfate 325 (65 FE) MG tablet Take 325 mg by mouth 2 (two) times daily with a meal.    [provider]  gabapentin (NEURONTIN) 300 MG capsule TAKE 1 CAPSULE BY MOUTH 2 TIMES DAILY 01/17/18   Venia Carbon, MD  gentamicin ointment (GARAMYCIN) 0.1 % Apply 1 application topically as needed.    [provider]  glucose blood (FREESTYLE LITE)  test strip USE AS DIRECTED TO CHECK BLOOD SUGAR ONCE DAILY 01/17/18   Venia Carbon, MD  lisinopril-hydrochlorothiazide (PRINZIDE,ZESTORETIC) 20-25 MG tablet Take 1 tablet by mouth daily. 06/19/16   Venia Carbon, MD  metFORMIN (GLUCOPHAGE-XR) 500 MG 24 hr tablet Take 1 tablet (500 mg total) by mouth 2 (two) times daily. NEEDS OFFICE VISIT 05/30/18   Venia Carbon, MD  Multiple Vitamin (MULTIVITAMIN) tablet Take 1 tablet by mouth daily.    [provider]  nystatin-triamcinolone (MYCOLOG II) cream Apply 1 application topically 2 (two) times daily.    [provider]  pantoprazole (PROTONIX) 40 MG tablet TAKE 1 TABLET BY MOUTH ONCE DAILY 04/19/17   Venia Carbon, MD  silver sulfADIAZINE (SILVADENE) 1 % cream Apply 1 application topically daily. 06/19/16   Venia Carbon, MD  traMADol (ULTRAM) 50 MG tablet Take 1 tablet (50 mg total) by mouth every 6 (six) hours as needed. 06/22/16   Venia Carbon, MD  verapamil (CALAN) 80 MG tablet TAKE 1 TABLET BY MOUTH TWICE A DAY 09/10/17   Venia Carbon, MD  zolpidem (AMBIEN) 10 MG tablet TAKE 1 TABLET BY MOUTH AT BEDTIME AS NEEDED FOR SLEEP 09/10/17   Venia Carbon, MD     Family History  Problem Relation Age of Onset   Cancer Mother        lung cancer   Parkinson's disease Father    Dementia Father    COPD Father    Heart disease Father    Cancer Sister        lung cancer   Cancer Son        Ewing's sarcoma   Breast cancer Neg Hx     Social History   Socioeconomic History   Marital status: Widowed    Spouse name: Not on file   Number of children: 1   Years of education: Not on file   Highest education level: Not on file  Occupational History   Occupation: [employer]  Tobacco Use   Smoking status: Never   Smokeless tobacco: Never  Vaping Use   Vaping Use: Never used  Substance and Sexual Activity   Alcohol use: Never    Alcohol/week: 0.0 standard drinks   Drug use: Not on file   Sexual  activity: Not on file  Other Topics Concern   Not on file  Social History Narrative   1 son died    1 son living--2 grandchildren      Had living will--not sure about it   No formal health care POA--would want son   Would accept resuscitation   No prolonged tube feeds if cognitively unaware   Social Determinants of Health   Financial Resource Strain: Not on file  Food Insecurity: Not on file  Transportation Needs: Not on file  Physical Activity:  Not on file  Stress: Not on file  Social Connections: Not on file    Review of Systems: A 12 point ROS discussed and pertinent positives are indicated in the HPI above.  All other systems are negative.  Review of Systems  Vital Signs: BP (!) 141/66   Temp 98.2 F (36.8 C) (Oral)   Resp 20   Ht 5' 1.5" (1.562 m)   Wt 148 lb (67.1 kg) Comment: lost 50 lb in last 2 years  SpO2 97%   BMI 27.51 kg/m   Physical Exam Constitutional:      Appearance: Normal appearance.  HENT:     Head: Normocephalic and atraumatic.  Cardiovascular:     Rate and Rhythm: Normal rate and regular rhythm.  Pulmonary:     Effort: Pulmonary effort is normal. No respiratory distress.  Abdominal:     Palpations: Abdomen is soft.  Skin:    General: Skin is warm and dry.  Neurological:     Mental Status: She is alert and oriented to person, place, and time.  Psychiatric:        Behavior: Behavior normal.        Thought Content: Thought content normal.    Imaging: No results found.  Labs:  CBC: Recent Labs    08/11/21 0802  WBC 14.5*  HGB 8.6*  HCT 27.4*  PLT 334    COAGS: Recent Labs    08/11/21 0802  INR 1.0    Assessment and Plan: 70 year old female who follows with Nephrology, last seen 10/6- no medical changes since this visit.  Acute on CKD stage IV, request received for image guided non target renal biopsy.  The patient has been NPO, no blood thinners taken last dose of ASA 1 week ago, labs and vitals have been  reviewed.  Risks and benefits of image guided renal biopsy was discussed with the patient and/or patient's family including, but not limited to bleeding, bleeding requiring blood transfusion or admission, infection, damage to kidney or adjacent structures or low yield requiring additional tests.  All of the questions were answered and there is agreement to proceed.  Consent signed and in chart.   Thank you for this interesting consult.  I greatly enjoyed meeting Anita Greene and look forward to participating in their care.  A copy of this report was sent to the requesting provider on this date.  Electronically Signed: Hedy Jacob, PA-C 08/11/2021, 8:59 AM    I spent a total of 15 Minutes in face to face in clinical consultation, greater than 50% of which was counseling/coordinating care for CKD.

## 2021-08-14 ENCOUNTER — Encounter: Payer: Self-pay | Admitting: Nephrology

## 2021-08-14 LAB — SURGICAL PATHOLOGY

## 2021-08-19 DIAGNOSIS — N185 Chronic kidney disease, stage 5: Secondary | ICD-10-CM | POA: Diagnosis not present

## 2021-08-19 DIAGNOSIS — E875 Hyperkalemia: Secondary | ICD-10-CM | POA: Diagnosis not present

## 2021-08-19 DIAGNOSIS — D631 Anemia in chronic kidney disease: Secondary | ICD-10-CM | POA: Diagnosis not present

## 2021-08-19 DIAGNOSIS — E1122 Type 2 diabetes mellitus with diabetic chronic kidney disease: Secondary | ICD-10-CM | POA: Diagnosis not present

## 2021-08-19 DIAGNOSIS — I10 Essential (primary) hypertension: Secondary | ICD-10-CM | POA: Diagnosis not present

## 2021-08-26 DIAGNOSIS — N185 Chronic kidney disease, stage 5: Secondary | ICD-10-CM | POA: Diagnosis not present

## 2021-09-02 DIAGNOSIS — I10 Essential (primary) hypertension: Secondary | ICD-10-CM | POA: Diagnosis not present

## 2021-09-02 DIAGNOSIS — N185 Chronic kidney disease, stage 5: Secondary | ICD-10-CM | POA: Diagnosis not present

## 2021-09-02 DIAGNOSIS — E875 Hyperkalemia: Secondary | ICD-10-CM | POA: Diagnosis not present

## 2021-09-02 DIAGNOSIS — D631 Anemia in chronic kidney disease: Secondary | ICD-10-CM | POA: Diagnosis not present

## 2021-09-25 DIAGNOSIS — D631 Anemia in chronic kidney disease: Secondary | ICD-10-CM | POA: Diagnosis not present

## 2021-09-25 DIAGNOSIS — I950 Idiopathic hypotension: Secondary | ICD-10-CM | POA: Diagnosis not present

## 2021-09-25 DIAGNOSIS — E1122 Type 2 diabetes mellitus with diabetic chronic kidney disease: Secondary | ICD-10-CM | POA: Diagnosis not present

## 2021-09-25 DIAGNOSIS — N185 Chronic kidney disease, stage 5: Secondary | ICD-10-CM | POA: Diagnosis not present

## 2021-10-06 DIAGNOSIS — R0602 Shortness of breath: Secondary | ICD-10-CM | POA: Diagnosis not present

## 2021-10-06 DIAGNOSIS — R011 Cardiac murmur, unspecified: Secondary | ICD-10-CM | POA: Diagnosis not present

## 2021-10-06 DIAGNOSIS — I950 Idiopathic hypotension: Secondary | ICD-10-CM | POA: Diagnosis not present

## 2021-10-07 DIAGNOSIS — N2581 Secondary hyperparathyroidism of renal origin: Secondary | ICD-10-CM | POA: Diagnosis not present

## 2021-10-07 DIAGNOSIS — D631 Anemia in chronic kidney disease: Secondary | ICD-10-CM | POA: Diagnosis not present

## 2021-10-07 DIAGNOSIS — E119 Type 2 diabetes mellitus without complications: Secondary | ICD-10-CM | POA: Diagnosis not present

## 2021-10-07 DIAGNOSIS — E7849 Other hyperlipidemia: Secondary | ICD-10-CM | POA: Diagnosis not present

## 2021-10-07 DIAGNOSIS — I10 Essential (primary) hypertension: Secondary | ICD-10-CM | POA: Diagnosis not present

## 2021-10-07 DIAGNOSIS — G43909 Migraine, unspecified, not intractable, without status migrainosus: Secondary | ICD-10-CM | POA: Diagnosis not present

## 2021-10-07 DIAGNOSIS — N185 Chronic kidney disease, stage 5: Secondary | ICD-10-CM | POA: Diagnosis not present

## 2021-10-16 DIAGNOSIS — R011 Cardiac murmur, unspecified: Secondary | ICD-10-CM | POA: Diagnosis not present

## 2021-10-16 DIAGNOSIS — R0602 Shortness of breath: Secondary | ICD-10-CM | POA: Diagnosis not present

## 2021-10-16 DIAGNOSIS — I950 Idiopathic hypotension: Secondary | ICD-10-CM | POA: Diagnosis not present

## 2021-10-22 DIAGNOSIS — I10 Essential (primary) hypertension: Secondary | ICD-10-CM | POA: Diagnosis not present

## 2021-10-22 DIAGNOSIS — N185 Chronic kidney disease, stage 5: Secondary | ICD-10-CM | POA: Diagnosis not present

## 2021-10-22 DIAGNOSIS — E7849 Other hyperlipidemia: Secondary | ICD-10-CM | POA: Diagnosis not present

## 2021-10-22 DIAGNOSIS — R0602 Shortness of breath: Secondary | ICD-10-CM | POA: Diagnosis not present

## 2021-11-10 DIAGNOSIS — N185 Chronic kidney disease, stage 5: Secondary | ICD-10-CM | POA: Diagnosis not present

## 2021-11-10 DIAGNOSIS — E1122 Type 2 diabetes mellitus with diabetic chronic kidney disease: Secondary | ICD-10-CM | POA: Diagnosis not present

## 2021-11-10 DIAGNOSIS — R82998 Other abnormal findings in urine: Secondary | ICD-10-CM | POA: Diagnosis not present

## 2021-11-10 DIAGNOSIS — D631 Anemia in chronic kidney disease: Secondary | ICD-10-CM | POA: Diagnosis not present

## 2021-11-10 DIAGNOSIS — I950 Idiopathic hypotension: Secondary | ICD-10-CM | POA: Diagnosis not present

## 2021-11-13 DIAGNOSIS — N185 Chronic kidney disease, stage 5: Secondary | ICD-10-CM | POA: Diagnosis not present

## 2021-11-13 DIAGNOSIS — D631 Anemia in chronic kidney disease: Secondary | ICD-10-CM | POA: Diagnosis not present

## 2021-11-13 DIAGNOSIS — E1122 Type 2 diabetes mellitus with diabetic chronic kidney disease: Secondary | ICD-10-CM | POA: Diagnosis not present

## 2021-11-13 DIAGNOSIS — I950 Idiopathic hypotension: Secondary | ICD-10-CM | POA: Diagnosis not present

## 2021-12-11 DIAGNOSIS — R5383 Other fatigue: Secondary | ICD-10-CM | POA: Diagnosis not present

## 2021-12-11 DIAGNOSIS — E538 Deficiency of other specified B group vitamins: Secondary | ICD-10-CM | POA: Diagnosis not present

## 2021-12-31 DIAGNOSIS — E1122 Type 2 diabetes mellitus with diabetic chronic kidney disease: Secondary | ICD-10-CM | POA: Diagnosis not present

## 2021-12-31 DIAGNOSIS — I950 Idiopathic hypotension: Secondary | ICD-10-CM | POA: Diagnosis not present

## 2021-12-31 DIAGNOSIS — N185 Chronic kidney disease, stage 5: Secondary | ICD-10-CM | POA: Diagnosis not present

## 2021-12-31 DIAGNOSIS — D631 Anemia in chronic kidney disease: Secondary | ICD-10-CM | POA: Diagnosis not present

## 2022-01-08 DIAGNOSIS — E1122 Type 2 diabetes mellitus with diabetic chronic kidney disease: Secondary | ICD-10-CM | POA: Diagnosis not present

## 2022-01-08 DIAGNOSIS — I950 Idiopathic hypotension: Secondary | ICD-10-CM | POA: Diagnosis not present

## 2022-01-08 DIAGNOSIS — D631 Anemia in chronic kidney disease: Secondary | ICD-10-CM | POA: Diagnosis not present

## 2022-01-08 DIAGNOSIS — N179 Acute kidney failure, unspecified: Secondary | ICD-10-CM | POA: Diagnosis not present

## 2022-01-08 DIAGNOSIS — N185 Chronic kidney disease, stage 5: Secondary | ICD-10-CM | POA: Diagnosis not present

## 2022-01-15 DIAGNOSIS — M542 Cervicalgia: Secondary | ICD-10-CM | POA: Diagnosis not present

## 2022-01-15 DIAGNOSIS — R251 Tremor, unspecified: Secondary | ICD-10-CM | POA: Diagnosis not present

## 2022-01-15 DIAGNOSIS — Z87440 Personal history of urinary (tract) infections: Secondary | ICD-10-CM | POA: Diagnosis not present

## 2022-01-15 DIAGNOSIS — M545 Low back pain, unspecified: Secondary | ICD-10-CM | POA: Diagnosis not present

## 2022-01-15 DIAGNOSIS — N185 Chronic kidney disease, stage 5: Secondary | ICD-10-CM | POA: Diagnosis not present

## 2022-01-15 DIAGNOSIS — G8929 Other chronic pain: Secondary | ICD-10-CM | POA: Diagnosis not present

## 2022-01-15 DIAGNOSIS — E1122 Type 2 diabetes mellitus with diabetic chronic kidney disease: Secondary | ICD-10-CM | POA: Diagnosis not present

## 2022-01-15 DIAGNOSIS — R531 Weakness: Secondary | ICD-10-CM | POA: Diagnosis not present

## 2022-01-20 DIAGNOSIS — R29898 Other symptoms and signs involving the musculoskeletal system: Secondary | ICD-10-CM | POA: Diagnosis not present

## 2022-01-20 DIAGNOSIS — D631 Anemia in chronic kidney disease: Secondary | ICD-10-CM | POA: Diagnosis not present

## 2022-01-20 DIAGNOSIS — R110 Nausea: Secondary | ICD-10-CM | POA: Diagnosis not present

## 2022-01-20 DIAGNOSIS — N390 Urinary tract infection, site not specified: Secondary | ICD-10-CM | POA: Diagnosis not present

## 2022-01-20 DIAGNOSIS — R8290 Unspecified abnormal findings in urine: Secondary | ICD-10-CM | POA: Diagnosis not present

## 2022-01-20 DIAGNOSIS — N185 Chronic kidney disease, stage 5: Secondary | ICD-10-CM | POA: Diagnosis not present

## 2022-01-20 DIAGNOSIS — R251 Tremor, unspecified: Secondary | ICD-10-CM | POA: Diagnosis not present

## 2022-01-21 ENCOUNTER — Other Ambulatory Visit: Payer: Self-pay

## 2022-01-21 ENCOUNTER — Emergency Department: Payer: PPO

## 2022-01-21 ENCOUNTER — Inpatient Hospital Stay: Payer: PPO

## 2022-01-21 ENCOUNTER — Inpatient Hospital Stay
Admission: EM | Admit: 2022-01-21 | Discharge: 2022-01-30 | DRG: 673 | Disposition: A | Discharge status: Skilled Nursing Facility | Payer: PPO | Source: Ambulatory Visit | Attending: Internal Medicine | Admitting: Internal Medicine

## 2022-01-21 DIAGNOSIS — Z801 Family history of malignant neoplasm of trachea, bronchus and lung: Secondary | ICD-10-CM

## 2022-01-21 DIAGNOSIS — N19 Unspecified kidney failure: Secondary | ICD-10-CM | POA: Diagnosis not present

## 2022-01-21 DIAGNOSIS — E538 Deficiency of other specified B group vitamins: Secondary | ICD-10-CM | POA: Diagnosis present

## 2022-01-21 DIAGNOSIS — M545 Low back pain, unspecified: Secondary | ICD-10-CM | POA: Diagnosis not present

## 2022-01-21 DIAGNOSIS — M797 Fibromyalgia: Secondary | ICD-10-CM | POA: Diagnosis present

## 2022-01-21 DIAGNOSIS — D631 Anemia in chronic kidney disease: Secondary | ICD-10-CM | POA: Diagnosis not present

## 2022-01-21 DIAGNOSIS — F39 Unspecified mood [affective] disorder: Secondary | ICD-10-CM | POA: Diagnosis present

## 2022-01-21 DIAGNOSIS — E876 Hypokalemia: Secondary | ICD-10-CM | POA: Diagnosis present

## 2022-01-21 DIAGNOSIS — K219 Gastro-esophageal reflux disease without esophagitis: Secondary | ICD-10-CM | POA: Diagnosis present

## 2022-01-21 DIAGNOSIS — R627 Adult failure to thrive: Secondary | ICD-10-CM | POA: Diagnosis not present

## 2022-01-21 DIAGNOSIS — Z7984 Long term (current) use of oral hypoglycemic drugs: Secondary | ICD-10-CM | POA: Diagnosis not present

## 2022-01-21 DIAGNOSIS — N189 Chronic kidney disease, unspecified: Secondary | ICD-10-CM | POA: Diagnosis not present

## 2022-01-21 DIAGNOSIS — E8721 Acute metabolic acidosis: Secondary | ICD-10-CM | POA: Diagnosis not present

## 2022-01-21 DIAGNOSIS — D638 Anemia in other chronic diseases classified elsewhere: Secondary | ICD-10-CM | POA: Diagnosis not present

## 2022-01-21 DIAGNOSIS — Z87440 Personal history of urinary (tract) infections: Secondary | ICD-10-CM

## 2022-01-21 DIAGNOSIS — Z820 Family history of epilepsy and other diseases of the nervous system: Secondary | ICD-10-CM

## 2022-01-21 DIAGNOSIS — R8290 Unspecified abnormal findings in urine: Secondary | ICD-10-CM | POA: Diagnosis not present

## 2022-01-21 DIAGNOSIS — Z7982 Long term (current) use of aspirin: Secondary | ICD-10-CM | POA: Diagnosis not present

## 2022-01-21 DIAGNOSIS — H9191 Unspecified hearing loss, right ear: Secondary | ICD-10-CM | POA: Diagnosis present

## 2022-01-21 DIAGNOSIS — J301 Allergic rhinitis due to pollen: Secondary | ICD-10-CM | POA: Diagnosis present

## 2022-01-21 DIAGNOSIS — D72829 Elevated white blood cell count, unspecified: Secondary | ICD-10-CM | POA: Diagnosis not present

## 2022-01-21 DIAGNOSIS — Z8249 Family history of ischemic heart disease and other diseases of the circulatory system: Secondary | ICD-10-CM

## 2022-01-21 DIAGNOSIS — Z9884 Bariatric surgery status: Secondary | ICD-10-CM

## 2022-01-21 DIAGNOSIS — I129 Hypertensive chronic kidney disease with stage 1 through stage 4 chronic kidney disease, or unspecified chronic kidney disease: Secondary | ICD-10-CM | POA: Diagnosis not present

## 2022-01-21 DIAGNOSIS — Z888 Allergy status to other drugs, medicaments and biological substances status: Secondary | ICD-10-CM

## 2022-01-21 DIAGNOSIS — I69314 Frontal lobe and executive function deficit following cerebral infarction: Secondary | ICD-10-CM | POA: Diagnosis not present

## 2022-01-21 DIAGNOSIS — R0602 Shortness of breath: Secondary | ICD-10-CM | POA: Diagnosis not present

## 2022-01-21 DIAGNOSIS — F32A Depression, unspecified: Secondary | ICD-10-CM | POA: Diagnosis present

## 2022-01-21 DIAGNOSIS — J309 Allergic rhinitis, unspecified: Secondary | ICD-10-CM | POA: Diagnosis not present

## 2022-01-21 DIAGNOSIS — N179 Acute kidney failure, unspecified: Secondary | ICD-10-CM | POA: Diagnosis present

## 2022-01-21 DIAGNOSIS — Z8673 Personal history of transient ischemic attack (TIA), and cerebral infarction without residual deficits: Secondary | ICD-10-CM | POA: Diagnosis not present

## 2022-01-21 DIAGNOSIS — I120 Hypertensive chronic kidney disease with stage 5 chronic kidney disease or end stage renal disease: Secondary | ICD-10-CM | POA: Diagnosis not present

## 2022-01-21 DIAGNOSIS — Q2112 Patent foramen ovale: Secondary | ICD-10-CM | POA: Diagnosis not present

## 2022-01-21 DIAGNOSIS — G250 Essential tremor: Secondary | ICD-10-CM | POA: Diagnosis present

## 2022-01-21 DIAGNOSIS — I69391 Dysphagia following cerebral infarction: Secondary | ICD-10-CM | POA: Diagnosis not present

## 2022-01-21 DIAGNOSIS — Z87442 Personal history of urinary calculi: Secondary | ICD-10-CM

## 2022-01-21 DIAGNOSIS — E1149 Type 2 diabetes mellitus with other diabetic neurological complication: Secondary | ICD-10-CM | POA: Diagnosis not present

## 2022-01-21 DIAGNOSIS — R531 Weakness: Principal | ICD-10-CM

## 2022-01-21 DIAGNOSIS — D509 Iron deficiency anemia, unspecified: Secondary | ICD-10-CM | POA: Diagnosis not present

## 2022-01-21 DIAGNOSIS — I69351 Hemiplegia and hemiparesis following cerebral infarction affecting right dominant side: Secondary | ICD-10-CM

## 2022-01-21 DIAGNOSIS — N1832 Chronic kidney disease, stage 3b: Secondary | ICD-10-CM | POA: Diagnosis not present

## 2022-01-21 DIAGNOSIS — I959 Hypotension, unspecified: Secondary | ICD-10-CM | POA: Diagnosis not present

## 2022-01-21 DIAGNOSIS — K580 Irritable bowel syndrome with diarrhea: Secondary | ICD-10-CM | POA: Diagnosis not present

## 2022-01-21 DIAGNOSIS — Z9049 Acquired absence of other specified parts of digestive tract: Secondary | ICD-10-CM

## 2022-01-21 DIAGNOSIS — Z882 Allergy status to sulfonamides status: Secondary | ICD-10-CM

## 2022-01-21 DIAGNOSIS — H4720 Unspecified optic atrophy: Secondary | ICD-10-CM | POA: Diagnosis not present

## 2022-01-21 DIAGNOSIS — G4733 Obstructive sleep apnea (adult) (pediatric): Secondary | ICD-10-CM | POA: Diagnosis present

## 2022-01-21 DIAGNOSIS — Z6823 Body mass index (BMI) 23.0-23.9, adult: Secondary | ICD-10-CM | POA: Diagnosis not present

## 2022-01-21 DIAGNOSIS — Z20822 Contact with and (suspected) exposure to covid-19: Secondary | ICD-10-CM | POA: Diagnosis not present

## 2022-01-21 DIAGNOSIS — E1122 Type 2 diabetes mellitus with diabetic chronic kidney disease: Secondary | ICD-10-CM | POA: Diagnosis not present

## 2022-01-21 DIAGNOSIS — N186 End stage renal disease: Secondary | ICD-10-CM

## 2022-01-21 DIAGNOSIS — I953 Hypotension of hemodialysis: Secondary | ICD-10-CM | POA: Diagnosis not present

## 2022-01-21 DIAGNOSIS — Z992 Dependence on renal dialysis: Secondary | ICD-10-CM

## 2022-01-21 DIAGNOSIS — Z808 Family history of malignant neoplasm of other organs or systems: Secondary | ICD-10-CM

## 2022-01-21 DIAGNOSIS — F419 Anxiety disorder, unspecified: Secondary | ICD-10-CM | POA: Diagnosis present

## 2022-01-21 DIAGNOSIS — I10 Essential (primary) hypertension: Secondary | ICD-10-CM | POA: Diagnosis not present

## 2022-01-21 DIAGNOSIS — Z885 Allergy status to narcotic agent status: Secondary | ICD-10-CM

## 2022-01-21 DIAGNOSIS — R1312 Dysphagia, oropharyngeal phase: Secondary | ICD-10-CM | POA: Diagnosis not present

## 2022-01-21 DIAGNOSIS — Z881 Allergy status to other antibiotic agents status: Secondary | ICD-10-CM

## 2022-01-21 DIAGNOSIS — E1142 Type 2 diabetes mellitus with diabetic polyneuropathy: Secondary | ICD-10-CM | POA: Diagnosis not present

## 2022-01-21 DIAGNOSIS — Z79899 Other long term (current) drug therapy: Secondary | ICD-10-CM

## 2022-01-21 DIAGNOSIS — M1990 Unspecified osteoarthritis, unspecified site: Secondary | ICD-10-CM | POA: Diagnosis present

## 2022-01-21 DIAGNOSIS — Z825 Family history of asthma and other chronic lower respiratory diseases: Secondary | ICD-10-CM

## 2022-01-21 DIAGNOSIS — R5381 Other malaise: Secondary | ICD-10-CM | POA: Insufficient documentation

## 2022-01-21 DIAGNOSIS — G2581 Restless legs syndrome: Secondary | ICD-10-CM | POA: Diagnosis not present

## 2022-01-21 DIAGNOSIS — R112 Nausea with vomiting, unspecified: Secondary | ICD-10-CM | POA: Diagnosis not present

## 2022-01-21 DIAGNOSIS — G43909 Migraine, unspecified, not intractable, without status migrainosus: Secondary | ICD-10-CM | POA: Diagnosis not present

## 2022-01-21 LAB — BLOOD GAS, ARTERIAL
Acid-base deficit: 14.9 mmol/L — ABNORMAL HIGH (ref 0.0–2.0)
Bicarbonate: 10.7 mmol/L — ABNORMAL LOW (ref 20.0–28.0)
O2 Saturation: 97.6 %
Patient temperature: 37
pCO2 arterial: 25 mmHg — ABNORMAL LOW (ref 32–48)
pH, Arterial: 7.24 — ABNORMAL LOW (ref 7.35–7.45)
pO2, Arterial: 114 mmHg — ABNORMAL HIGH (ref 83–108)

## 2022-01-21 LAB — BASIC METABOLIC PANEL
Anion gap: 17 — ABNORMAL HIGH (ref 5–15)
BUN: 99 mg/dL — ABNORMAL HIGH (ref 8–23)
CO2: 11 mmol/L — ABNORMAL LOW (ref 22–32)
Calcium: 7.3 mg/dL — ABNORMAL LOW (ref 8.9–10.3)
Chloride: 106 mmol/L (ref 98–111)
Creatinine, Ser: 10.53 mg/dL — ABNORMAL HIGH (ref 0.44–1.00)
GFR, Estimated: 4 mL/min — ABNORMAL LOW (ref >60)
Glucose, Bld: 133 mg/dL — ABNORMAL HIGH (ref 70–99)
Potassium: 3.7 mmol/L (ref 3.5–5.1)
Sodium: 134 mmol/L — ABNORMAL LOW (ref 135–145)

## 2022-01-21 LAB — URINALYSIS, ROUTINE W REFLEX MICROSCOPIC
Bilirubin Urine: NEGATIVE
Glucose, UA: NEGATIVE mg/dL
Ketones, ur: NEGATIVE mg/dL
Nitrite: NEGATIVE
Protein, ur: NEGATIVE mg/dL
Specific Gravity, Urine: 1.009 (ref 1.005–1.030)
pH: 5 (ref 5.0–8.0)

## 2022-01-21 LAB — CBC
HCT: 24.4 % — ABNORMAL LOW (ref 36.0–46.0)
Hemoglobin: 7.9 g/dL — ABNORMAL LOW (ref 12.0–15.0)
MCH: 30 pg (ref 26.0–34.0)
MCHC: 32.4 g/dL (ref 30.0–36.0)
MCV: 92.8 fL (ref 80.0–100.0)
Platelets: 339 10*3/uL (ref 150–400)
RBC: 2.63 MIL/uL — ABNORMAL LOW (ref 3.87–5.11)
RDW: 15 % (ref 11.5–15.5)
WBC: 11.4 10*3/uL — ABNORMAL HIGH (ref 4.0–10.5)
nRBC: 0 % (ref 0.0–0.2)

## 2022-01-21 LAB — RESP PANEL BY RT-PCR (FLU A&B, COVID) ARPGX2
Influenza A by PCR: NEGATIVE
Influenza B by PCR: NEGATIVE
SARS Coronavirus 2 by RT PCR: NEGATIVE

## 2022-01-21 LAB — HEPATIC FUNCTION PANEL
ALT: 15 U/L (ref 0–44)
AST: 20 U/L (ref 15–41)
Albumin: 3.8 g/dL (ref 3.5–5.0)
Alkaline Phosphatase: 88 U/L (ref 38–126)
Bilirubin, Direct: <0.1 mg/dL (ref 0.0–0.2)
Total Bilirubin: 0.7 mg/dL (ref 0.3–1.2)
Total Protein: 7 g/dL (ref 6.5–8.1)

## 2022-01-21 LAB — PHOSPHORUS: Phosphorus: 9.6 mg/dL — ABNORMAL HIGH (ref 2.5–4.6)

## 2022-01-21 LAB — IRON AND TIBC
Iron: 95 ug/dL (ref 28–170)
Saturation Ratios: 29 % (ref 10.4–31.8)
TIBC: 330 ug/dL (ref 250–450)
UIBC: 235 ug/dL

## 2022-01-21 LAB — CK: Total CK: 115 U/L (ref 38–234)

## 2022-01-21 LAB — VITAMIN B12: Vitamin B-12: >7500 pg/mL — ABNORMAL HIGH (ref 180–914)

## 2022-01-21 LAB — TSH: TSH: 5.655 u[IU]/mL — ABNORMAL HIGH (ref 0.350–4.500)

## 2022-01-21 LAB — SEDIMENTATION RATE: Sed Rate: 82 mm/hr — ABNORMAL HIGH (ref 0–30)

## 2022-01-21 LAB — CORTISOL: Cortisol, Plasma: 28.9 ug/dL

## 2022-01-21 LAB — FOLATE: Folate: 17.7 ng/mL (ref >5.9)

## 2022-01-21 LAB — FERRITIN: Ferritin: 71 ng/mL (ref 11–307)

## 2022-01-21 LAB — MAGNESIUM: Magnesium: 1.4 mg/dL — ABNORMAL LOW (ref 1.7–2.4)

## 2022-01-21 LAB — C-REACTIVE PROTEIN: CRP: 0.8 mg/dL (ref <1.0)

## 2022-01-21 MED ORDER — SODIUM CHLORIDE 0.9 % IV SOLN: INTRAVENOUS | Status: DC

## 2022-01-21 MED ORDER — SODIUM CHLORIDE 0.9% FLUSH
3.0000 mL | Freq: Two times a day (BID) | INTRAVENOUS | Status: DC
  Administered 2022-01-21 – 2022-01-29 (×9): 3 mL via INTRAVENOUS

## 2022-01-21 MED ORDER — CALCIUM ACETATE (PHOS BINDER) 667 MG PO CAPS
1334.0000 mg | ORAL_CAPSULE | Freq: Three times a day (TID) | ORAL | Status: DC
  Administered 2022-01-22 – 2022-01-29 (×19): 1334 mg via ORAL
  Filled 2022-01-21 (×21): qty 2

## 2022-01-21 MED ORDER — BUSPIRONE HCL 15 MG PO TABS
15.0000 mg | ORAL_TABLET | Freq: Three times a day (TID) | ORAL | Status: DC
  Administered 2022-01-21 – 2022-01-29 (×22): 15 mg via ORAL
  Filled 2022-01-21 (×3): qty 1
  Filled 2022-01-21: qty 2
  Filled 2022-01-21: qty 1.5
  Filled 2022-01-21 (×7): qty 2
  Filled 2022-01-21 (×2): qty 1
  Filled 2022-01-21: qty 2
  Filled 2022-01-21 (×2): qty 1
  Filled 2022-01-21 (×3): qty 2
  Filled 2022-01-21: qty 1
  Filled 2022-01-21 (×3): qty 2
  Filled 2022-01-21 (×2): qty 1
  Filled 2022-01-21 (×3): qty 2

## 2022-01-21 MED ORDER — ACETAMINOPHEN 325 MG PO TABS
650.0000 mg | ORAL_TABLET | Freq: Four times a day (QID) | ORAL | Status: DC | PRN
  Administered 2022-01-23: 650 mg via ORAL

## 2022-01-21 MED ORDER — ACETAMINOPHEN 650 MG RE SUPP: 650.0000 mg | Freq: Four times a day (QID) | RECTAL | Status: DC | PRN

## 2022-01-21 MED ORDER — FERROUS SULFATE 325 (65 FE) MG PO TABS
325.0000 mg | ORAL_TABLET | Freq: Two times a day (BID) | ORAL | Status: DC
  Administered 2022-01-22 – 2022-01-29 (×13): 325 mg via ORAL
  Filled 2022-01-21 (×13): qty 1

## 2022-01-21 MED ORDER — ALBUTEROL SULFATE (2.5 MG/3ML) 0.083% IN NEBU: 2.5000 mg | INHALATION_SOLUTION | RESPIRATORY_TRACT | Status: DC | PRN

## 2022-01-21 MED ORDER — BUPROPION HCL ER (XL) 150 MG PO TB24
300.0000 mg | ORAL_TABLET | Freq: Every day | ORAL | Status: DC
  Administered 2022-01-22 – 2022-01-30 (×8): 300 mg via ORAL
  Filled 2022-01-21 (×8): qty 2

## 2022-01-21 MED ORDER — MAGNESIUM SULFATE IN D5W 1-5 GM/100ML-% IV SOLN
1.0000 g | Freq: Once | INTRAVENOUS | Status: AC
  Administered 2022-01-21: 1 g via INTRAVENOUS
  Filled 2022-01-21: qty 100

## 2022-01-21 MED ORDER — ASPIRIN EC 81 MG PO TBEC
81.0000 mg | DELAYED_RELEASE_TABLET | Freq: Every day | ORAL | Status: DC
Start: 2022-01-21 — End: 2022-01-30
  Administered 2022-01-21 – 2022-01-29 (×9): 81 mg via ORAL
  Filled 2022-01-21 (×9): qty 1

## 2022-01-21 MED ORDER — MIDODRINE HCL 5 MG PO TABS
10.0000 mg | ORAL_TABLET | Freq: Three times a day (TID) | ORAL | Status: DC
  Administered 2022-01-22: 10 mg via ORAL
  Filled 2022-01-21 (×2): qty 2

## 2022-01-21 MED ORDER — SODIUM BICARBONATE 650 MG PO TABS
650.0000 mg | ORAL_TABLET | Freq: Three times a day (TID) | ORAL | Status: DC
  Filled 2022-01-21 (×2): qty 1

## 2022-01-21 MED ORDER — STERILE WATER FOR INJECTION IV SOLN
INTRAVENOUS | Status: DC
  Filled 2022-01-21: qty 150
  Filled 2022-01-21: qty 1000

## 2022-01-21 MED ORDER — DULOXETINE HCL 30 MG PO CPEP
30.0000 mg | ORAL_CAPSULE | Freq: Every day | ORAL | Status: DC
  Administered 2022-01-22 – 2022-01-30 (×8): 30 mg via ORAL
  Filled 2022-01-21 (×8): qty 1

## 2022-01-21 MED ORDER — MAGNESIUM SULFATE 50 % IJ SOLN
1.0000 g | Freq: Once | INTRAMUSCULAR | Status: DC
  Filled 2022-01-21: qty 2

## 2022-01-21 NOTE — ED Notes (Signed)
In US 

## 2022-01-21 NOTE — H&P (Addendum)
History and Physical    Anita Greene ION:[id number] DOB: November 01, 1950 DOA: 01/21/2022  PCP: Jerl Mina, MD  Patient coming from: [facility] p[facility] I have personally briefly reviewed patient's old medical records in [facility]  Chief Complaint:  fatigue/weakness/tremors  HPI: Anita Greene is a 71 y.o. female with medical history significant of  DMII,GERD,HTN,OSA,CVA,IDA,Depression/Anxiety,fibromyalgia ,CKDIII with rapid progression since 1/23 where cr was 3.13 to most recent cr of 10 noted at pcp office today on evaluation for progressive fatigue/malaise, lower extremity weakness with tremors.  Patient sent to ed for evaluation for progression of CKDIII.  Patient states she has feel unwell with over the last 2-3 weeks. She notes this started after she was treated for a UTI. She states her lower extremity weakness with intermittent pulsating muscular pain has progressed. She notes that sitting or lying down improved her pain but noted note she is now having significant difficulty walking. She states she has a history of essential tremors but now noted tremors in her lower extremities as well. She denies tenderness of her muscles, tea colored urine, recent uri, or vaccination. She did note diarrhea but this was with the use of antibiotics. She denies joint pain , rash or swelling and has not had any recent travel or sick contacts. She has not had symptoms like this in the past and denies new medications other than completed course of levaquin for her uti a few weeks ago. Further ros she notes no chest pain, n/v/abdominal pain/ increase anxiety,depression or flare of her fibromyalgia.  ED Course:  Vitals:  98.6, bp 103/55,-148/66 rr16, hr84 sat 100% on ra  Wbc 11.4, hgb 7.9( 8.6), mcv 92 Na:134, K 3.7, gly 133, bun 99, cr 10.53, AG 17,bicarb 11 Mag 1.4,phos 9.6 Cxr:NAD Case discussed with Dr Thedore Mins who noted based on patient wishes and persistent symptoms ,plans on initiating HD this  admission. Patient in agreement Review of Systems: As per HPI otherwise 10 point review of systems negative.   Past Medical History:  Diagnosis Date   Allergic rhinitis due to pollen    Arthritis    B12 deficiency    Chronic kidney disease    Fibromyalgia    GERD (gastroesophageal reflux disease)    Hemiparesis affecting right side as late effect of cerebrovascular accident (CVA) (HCC)    Hypertension    IBS (irritable bowel syndrome)    diarrhea prone   Iron deficiency anemia    Migraine headache    Mood disorder (HCC)    Obstructive sleep apnea    CPAP 11 (auto titrate)   Optic atrophy of right eye    PFO (patent foramen ovale)    RLS (restless legs syndrome)    Sleep disturbance    Stroke (HCC)    Type 2 diabetes mellitus with neurological manifestations, controlled (HCC)     Past Surgical History:  Procedure Laterality Date   CATARACT EXTRACTION W/ INTRAOCULAR LENS  IMPLANT, BILATERAL     CHOLECYSTECTOMY     COLONOSCOPY     COLONOSCOPY WITH PROPOFOL N/A 04/04/2018   Procedure: COLONOSCOPY WITH PROPOFOL;  Surgeon: Christena Deem, MD;  Location: [facility] ENDOSCOPY;  Service: Endoscopy;  Laterality: N/A;   ESOPHAGOGASTRODUODENOSCOPY (EGD) WITH PROPOFOL N/A 04/04/2018   Procedure: ESOPHAGOGASTRODUODENOSCOPY (EGD) WITH PROPOFOL;  Surgeon: Christena Deem, MD;  Location: [facility] ENDOSCOPY;  Service: Endoscopy;  Laterality: N/A;   HERNIA REPAIR     NISSEN FUNDOPLICATION     TONSILLECTOMY     TUBAL LIGATION  UPPER GASTROINTESTINAL ENDOSCOPY     Uroplasty  ~2000's   Dr Lonna Cobb     reports that she has never smoked. She has never used smokeless tobacco. She reports that she does not drink alcohol. No history on file for drug use.  Allergies  Allergen Reactions   Cefditoren Pivoxil     Other reaction(s): Other (See Comments) GI issues, severe abdominal pain   Clarithromycin     Other reaction(s): Other (See Comments) GI issues   Codeine Sulfate Other (See Comments)    Erythromycin Other (See Comments)   Other Other (See Comments)    Oral iron   Pseudoephedrine     ams   Sulfa Antibiotics Itching    Family History  Problem Relation Age of Onset   Cancer Mother        lung cancer   Parkinson's disease Father    Dementia Father    COPD Father    Heart disease Father    Cancer Sister        lung cancer   Cancer Son        Ewing's sarcoma   Breast cancer Neg Hx    Prior to Admission medications   Medication Sig Start Date End Date Taking? Authorizing Provider  ALPRAZolam Prudy Feeler) 0.5 MG tablet TAKE ONE TABLET BY MOUTH TWICE DAILY AS NEEDED 09/10/17   Karie Schwalbe, MD  ascorbic acid (VITAMIN C) 500 MG tablet Take 500 mg by mouth daily.    [provider]  aspirin EC 81 MG tablet Take 81 mg by mouth daily.     [provider]  cetirizine (ZYRTEC) 10 MG tablet Take 1 tablet (10 mg total) by mouth daily. 06/19/16   Karie Schwalbe, MD  cyclobenzaprine (FLEXERIL) 10 MG tablet TAKE 1 TABLET BY MOUTH 3 TIMES DAILY AS NEEDED 07/16/17   Tillman Abide I, MD  diclofenac sodium (VOLTAREN) 1 % GEL APPLY 2 GRAMS TO THE SHOULDER UP TO 4 TIMES DAILY AS NEEDED 01/17/18   Tillman Abide I, MD  diphenoxylate-atropine (LOMOTIL) 2.5-0.025 MG tablet TAKE 1 TABLET BY MOUTH 4 TIMES DAILY AS NEEDED FOR DIARRHEA OR LOOSE STOOLS. 09/10/17   Karie Schwalbe, MD  DULoxetine (CYMBALTA) 30 MG capsule Take 1 capsule (30 mg total) by mouth daily. 06/15/17   Karie Schwalbe, MD  ferrous sulfate 325 (65 FE) MG tablet Take 325 mg by mouth 2 (two) times daily with a meal.    [provider]  gabapentin (NEURONTIN) 300 MG capsule TAKE 1 CAPSULE BY MOUTH 2 TIMES DAILY 01/17/18   Karie Schwalbe, MD  gentamicin ointment (GARAMYCIN) 0.1 % Apply 1 application topically as needed.    [provider]  glucose blood (FREESTYLE LITE) test strip USE AS DIRECTED TO CHECK BLOOD SUGAR ONCE DAILY 01/17/18   Karie Schwalbe, MD   lisinopril-hydrochlorothiazide (PRINZIDE,ZESTORETIC) 20-25 MG tablet Take 1 tablet by mouth daily. 06/19/16   Karie Schwalbe, MD  metFORMIN (GLUCOPHAGE-XR) 500 MG 24 hr tablet Take 1 tablet (500 mg total) by mouth 2 (two) times daily. NEEDS OFFICE VISIT 05/30/18   Karie Schwalbe, MD  Multiple Vitamin (MULTIVITAMIN) tablet Take 1 tablet by mouth daily.    [provider]  nystatin-triamcinolone (MYCOLOG II) cream Apply 1 application topically 2 (two) times daily.    [provider]  pantoprazole (PROTONIX) 40 MG tablet TAKE 1 TABLET BY MOUTH ONCE DAILY 04/19/17   Karie Schwalbe, MD  silver sulfADIAZINE (SILVADENE) 1 % cream  Apply 1 application topically daily. 06/19/16   Karie Schwalbe, MD  traMADol (ULTRAM) 50 MG tablet Take 1 tablet (50 mg total) by mouth every 6 (six) hours as needed. 06/22/16   Karie Schwalbe, MD  verapamil (CALAN) 80 MG tablet TAKE 1 TABLET BY MOUTH TWICE A DAY 09/10/17   Karie Schwalbe, MD  zolpidem (AMBIEN) 10 MG tablet TAKE 1 TABLET BY MOUTH AT BEDTIME AS NEEDED FOR SLEEP 09/10/17   Karie Schwalbe, MD    Physical Exam: Vitals:   01/21/22 1028 01/21/22 1029 01/21/22 1344  BP: (!) 103/55  (!) 148/66  Pulse: 84  81  Resp:  16 20  Temp: 98.6 F (37 C)    TempSrc: Oral    SpO2: 100%  99%     Vitals:   01/21/22 1028 01/21/22 1029 01/21/22 1344  BP: (!) 103/55  (!) 148/66  Pulse: 84  81  Resp:  16 20  Temp: 98.6 F (37 C)    TempSrc: Oral    SpO2: 100%  99%  Constitutional: NAD, calm, comfortable Eyes: PERRL, lids and conjunctivae normal ENMT: Mucous membranes are moist. Posterior pharynx clear of any exudate or lesions.Normal dentition.  Neck: normal, supple, no masses, no thyromegaly Respiratory: clear to auscultation bilaterally, no wheezing, no crackles. Normal respiratory effort. No accessory muscle use.  Cardiovascular: Regular rate and rhythm, no murmurs / rubs / gallops. No extremity edema. 2+ pedal pulses. No carotid  bruits.  Abdomen: no tenderness, no masses palpated. No hepatosplenomegaly. Bowel sounds positive.  Musculoskeletal: no clubbing / cyanosis. No joint deformity upper and lower extremities. Good ROM, no contractures. Normal muscle tone.  Skin: no rashes, lesions, ulcers. No induration Neurologic: CN 2-12 grossly intact. Sensation intact, DTR normal. Strength 5/5 in all 4.  Psychiatric: Normal judgment and insight. Alert and oriented x 3. Normal mood.    Labs on Admission: I have personally reviewed following labs and imaging studies  CBC: Recent Labs  Lab 01/21/22 1036  WBC 11.4*  HGB 7.9*  HCT 24.4*  MCV 92.8  PLT 339   Basic Metabolic Panel: Recent Labs  Lab 01/21/22 1036 01/21/22 1214  NA 134*  --   K 3.7  --   CL 106  --   CO2 11*  --   GLUCOSE 133*  --   BUN 99*  --   CREATININE 10.53*  --   CALCIUM 7.3*  --   MG  --  1.4*  PHOS  --  9.6*   GFR: CrCl cannot be calculated (Unknown ideal weight.). Liver Function Tests: Recent Labs  Lab 01/21/22 1036  AST 20  ALT 15  ALKPHOS 88  BILITOT 0.7  PROT 7.0  ALBUMIN 3.8   No results for input(s): LIPASE, AMYLASE in the last 168 hours. No results for input(s): AMMONIA in the last 168 hours. Coagulation Profile: No results for input(s): INR, PROTIME in the last 168 hours. Cardiac Enzymes: No results for input(s): CKTOTAL, CKMB, CKMBINDEX, TROPONINI in the last 168 hours. BNP (last 3 results) No results for input(s): PROBNP in the last 8760 hours. HbA1C: No results for input(s): HGBA1C in the last 72 hours. CBG: No results for input(s): GLUCAP in the last 168 hours. Lipid Profile: No results for input(s): CHOL, HDL, LDLCALC, TRIG, CHOLHDL, LDLDIRECT in the last 72 hours. Thyroid Function Tests: No results for input(s): TSH, T4TOTAL, FREET4, T3FREE, THYROIDAB in the last 72 hours. Anemia Panel: No results for input(s): VITAMINB12, FOLATE, FERRITIN, TIBC, IRON, RETICCTPCT  in the last 72 hours. Urine analysis:     Component Value Date/Time   COLORURINE YELLOW 12/23/2016 1419   APPEARANCEUR CLEAR 12/23/2016 1419   LABSPEC 1.025 12/23/2016 1419   PHURINE 5.5 12/23/2016 1419   GLUCOSEU NEGATIVE 12/23/2016 1419   HGBUR SMALL (A) 12/23/2016 1419   BILIRUBINUR NEGATIVE 12/23/2016 1419   BILIRUBINUR negative 06/19/2016 0845   KETONESUR NEGATIVE 12/23/2016 1419   PROTEINUR negative 06/19/2016 0845   UROBILINOGEN 0.2 12/23/2016 1419   NITRITE NEGATIVE 12/23/2016 1419   LEUKOCYTESUR SMALL (A) 12/23/2016 1419    Radiological Exams on Admission: DG Chest 2 View  Result Date: 01/21/2022 CLINICAL DATA:  Shortness of breath EXAM: CHEST - 2 VIEW COMPARISON:  04/12/2018 FINDINGS: No focal consolidation. No pleural effusion or pneumothorax. Heart and mediastinal contours are unremarkable. Thoracic aortic atherosclerosis. No acute osseous abnormality. IMPRESSION: No active cardiopulmonary disease. Electronically Signed   By: Elige Ko M.D.   On: 01/21/2022 14:29   US Renal  Result Date: 01/21/2022 CLINICAL DATA:  Renal failure. EXAM: RENAL / URINARY TRACT ULTRASOUND COMPLETE COMPARISON:  Ultrasound 07/04/2021 FINDINGS: Right Kidney: Renal measurements: 8.1 x 5.2 x 5.4 cm = volume: 97 mL. Diffusely increased echogenicity with cortical thinning. Echogenic focus in the right kidney could represent a renal calculus that measures up to 0.8 cm. Negative for right hydronephrosis. Question a small cyst in the interpolar region. Left Kidney: Renal measurements: 8.5 x 5.2 x 5.3 cm = volume: 122 mL. Diffusely increased echogenicity without hydronephrosis. Limited evaluation of the left kidney due to overlying ribs and bowel gas. Bladder: Fluid in the urinary bladder. Evidence for bilateral ureter jets. Prevoid bladder volume is 96 mL. Other: None. IMPRESSION: 1. Increased echogenicity in both kidneys and findings are most compatible with chronic medical renal disease. 2. Negative for hydronephrosis. 3. Possible right kidney  stone. Electronically Signed   By: Richarda Overlie M.D.   On: 01/21/2022 13:47    EKG: Independently reviewed. pending  Assessment/Plan Acute on Chronic Renal failure  -progression of renal failure over the last 3 months -increase symptoms of fatigue and weakness with tremors  -progressive metabolic acidosis with bicarb of 11  -of note no fluid overload noted  -plan for initiation of HD this visit per Dr Sedonia Small -check abg, is stable ph will start on bicarb oral if acidotic will start bicarb drip -renal consult  -hold nephrotoxic medications -gentle hydration  -patient has stable UOP   B/L lower extremity weakness -possible exacerbated by AKI as well as hypomagnesemia  -will need to r/o organic path although may be solely metabolic -CT head,  Cervical spine/lumbar spine -check CK,aldolase,enolase, crp,esr ,B12 -consider neuro evaluation if patient does note improve with treatment of metabolic causes  -PT/OT   CVA -continue asa    DMII -on metformin  -will hold  -place on insulin iss and fs  Hypomagnesemia -replace judiciously  GERD -continue ppi   HTN -labile  -hold hctz /ace  -prn midodrine for low bp   OSA -continue cpap qhs   CVA -continue secondary ppx   IDA -drop in h/h 1 point ,but noted normocytic  -no signs of bleeding  -possible mixed picture -check iron panel/ epo  -monitor h/h   Mild leukocytosis -? Due to stress response/inflammation -monitor trend  Depression/Anxiety,fibromyalgia  -possible flare of above dx as well  -resume home medications  -consider further titration if no improvement with tx of renal failure    DVT prophylaxis: heparin  Code Status: Full Family Communication: n/a  Disposition Plan: patient  expected to be admitted greater than 2 midnights  Consults called: nephrology Dr Thedore Mins Admission status: med tele    Lurline Del MD [facility]   If 7PM-7AM, please contact  night-coverage [email or web address] Password [email or web address]  01/21/2022, 3:24 PM

## 2022-01-21 NOTE — ED Notes (Signed)
Pt presents to ED with c/o of weakness that is all over and has been ongoing. Pt states "I am about to go on dialysis". Pt states she has been seeing nephrology and is concerned that her weakness is coming from her kidney issues. Pt is A&Ox4.  ?

## 2022-01-21 NOTE — Progress Notes (Addendum)
Pt went to CT. ? ?Update 2032: Pt back from CT. ?

## 2022-01-21 NOTE — ED Triage Notes (Signed)
Pt comes with c/o increased weakness and kidney issues. Pt states her doc sent her here for more intense evaluation of kidneys. Pt states some lower back pain. ?

## 2022-01-21 NOTE — ED Notes (Signed)
Admitting MD at bedside. Printed EKG showed to MD ?

## 2022-01-21 NOTE — ED Notes (Signed)
RN aware bed assigned ?

## 2022-01-21 NOTE — ED Provider Notes (Addendum)
? ?[facility] ?Provider Note ? ? ? Event Date/Time  ? First MD Initiated Contact with Patient 01/21/22 1311   ?  (approximate) ? ? ?History  ? ?Weakness ? ? ?HPI ? ?Anita Greene is a 71 y.o. female   with past medical history of low back pain, fibromyalgia, diabetes, CKD  nephritis who follows with nephrology, frequent UTIs who presents with generalized weakness and worsening kidney function.  Patient notes that over the last several weeks she has been unable to walk but she feels very unsteady on her feet.  Also feels extremely fatigued.  Over the last several days has not had much appetite.  Patient follows with Dr. Candiss Norse with nephrology.  Creatinine on March 8 of this month was 5.2.  Patient denies fevers chills abdominal pain vomiting chest pain or dyspnea.  She is not taking NSAIDs.  Still taking midodrine and blood pressure has been stabilized. ? ?  ? ?Past Medical History:  ?Diagnosis Date  ? Allergic rhinitis due to pollen   ? Arthritis   ? B12 deficiency   ? Chronic kidney disease   ? Fibromyalgia   ? GERD (gastroesophageal reflux disease)   ? Hemiparesis affecting right side as late effect of cerebrovascular accident (CVA) ([place])   ? Hypertension   ? IBS (irritable bowel syndrome)   ? diarrhea prone  ? Iron deficiency anemia   ? Migraine headache   ? Mood disorder ([place])   ? Obstructive sleep apnea   ? CPAP 11 (auto titrate)  ? Optic atrophy of right eye   ? PFO (patent foramen ovale)   ? RLS (restless legs syndrome)   ? Sleep disturbance   ? Stroke [facility])   ? Type 2 diabetes mellitus with neurological manifestations, controlled ([place])   ? ? ?Patient Active Problem List  ? Diagnosis Date Noted  ? Myalgia 03/26/2017  ? Chronic kidney disease due to type 2 diabetes mellitus ([place]) 03/26/2017  ? Iron deficiency anemia 03/26/2017  ? Advance directive discussed with patient 12/15/2016  ? Preventative health care 06/19/2016  ? Type 2 diabetes mellitus with neurological manifestations,  controlled ([place])   ? Fibromyalgia   ? Hemiparesis affecting right side as late effect of cerebrovascular accident (CVA) ([place])   ? Migraine headache   ? IBS (irritable bowel syndrome)   ? Mood disorder ([place])   ? Obstructive sleep apnea   ? Sleep disturbance   ? GERD (gastroesophageal reflux disease)   ? ? ? ?Physical Exam  ?Triage Vital Signs: ?ED Triage Vitals  ?Enc Vitals Group  ?   BP 01/21/22 1028 (!) 103/55  ?   Pulse Rate 01/21/22 1028 84  ?   Resp 01/21/22 1029 16  ?   Temp 01/21/22 1028 98.6 ?F (37 ?C)  ?   Temp Source 01/21/22 1028 Oral  ?   SpO2 01/21/22 1028 100 %  ?   Weight --   ?   Height --   ?   Head Circumference --   ?   Peak Flow --   ?   Pain Score 01/21/22 1021 4  ?   Pain Loc --   ?   Pain Edu? --   ?   Excl. in [place]? --   ? ? ?Most recent vital signs: ?Vitals:  ? 01/21/22 1029 01/21/22 1344  ?BP:  (!) 148/66  ?Pulse:  81  ?Resp: 16 20  ?Temp:    ?SpO2:  99%  ? ? ? ?General:  Awake, no distress.  Appears pale, chronically ill ?CV:  Good peripheral perfusion.  No edema ?Resp:  Normal effort.  No increased work of breathing ?Abd:  No distention.  ?Neuro:             Awake, Alert, Oriented x 3  ?Other:  Patient is mildly tremulous but has full strength in bilateral upper and lower extremities ? ? ?ED Results / Procedures / Treatments  ?Labs ?(all labs ordered are listed, but only abnormal results are displayed) ?Labs Reviewed  ?BASIC METABOLIC PANEL - Abnormal; Notable for the following components:  ?    Result Value  ? Sodium 134 (*)   ? CO2 11 (*)   ? Glucose, Bld 133 (*)   ? BUN 99 (*)   ? Creatinine, Ser 10.53 (*)   ? Calcium 7.3 (*)   ? GFR, Estimated 4 (*)   ? Anion gap 17 (*)   ? All other components within normal limits  ?CBC - Abnormal; Notable for the following components:  ? WBC 11.4 (*)   ? RBC 2.63 (*)   ? Hemoglobin 7.9 (*)   ? HCT 24.4 (*)   ? All other components within normal limits  ?MAGNESIUM - Abnormal; Notable for the following components:  ? Magnesium 1.4 (*)   ? All other  components within normal limits  ?PHOSPHORUS - Abnormal; Notable for the following components:  ? Phosphorus 9.6 (*)   ? All other components within normal limits  ?HEPATIC FUNCTION PANEL  ?URINALYSIS, ROUTINE W REFLEX MICROSCOPIC  ?CBG MONITORING, ED  ? ? ? ?EKG ? ?EKG reviewed by myself, inferior Q waves, right axis deviation, T wave flattening in precordial leads ? ? ?RADIOLOGY ?I reviewed the CXR which does not show any acute cardiopulmonary process; agree with radiology report  ? ? ? ?PROCEDURES: ? ?Critical Care performed: No ? ?Procedures ? ? ? ?MEDICATIONS ORDERED IN ED: ?Medications - No data to display ? ? ?IMPRESSION / MDM / ASSESSMENT AND PLAN / ED COURSE  ?I reviewed the triage vital signs and the nursing notes. ?             ?               ? ?Differential diagnosis includes, but is not limited to, progression of CKD ? ?Patient is a 71 year old female with past medical history of CKD secondary to diabetes presents due to global weakness difficulty ambulating and worsening renal function.  She is followed by Dr. Candiss Norse with nephrology, creatinine from 3/8 was 5, he had been having discussions with her about eventual dialysis.  Blood work was checked by her primary doctor which was worsening and patient was told to come to the ED. versus generalized weakness feeling very unsteady, she is now barely able to get around the house.  Vital signs within normal limits.  Patient appears chronically ill but nontoxic.  She is alert and oriented mildly tremulous does not appear grossly uremic.  Creatinine is significantly elevated above 10 today she has an anion gap of 17 bicarb of 11 BUN of 99.  Potassium is normal.  We will send extended electrolytes mag and Phos.  Discussed with Dr. Candiss Norse with nephrology and notes that the radically patient could have dialysis arranged as an outpatient but if she is feeling too unwell to go home he will arrange for PermCath placement and dialysis sooner in the hospital.   Discussed with patient and her son who did not feel comfortable  going home because of her global weakness.  I do suspect that her uremia is contributing to this.  Will admit to the hospital. ? ?  ? ? ?FINAL CLINICAL IMPRESSION(S) / ED DIAGNOSES  ? ?Final diagnoses:  ?Weakness  ?Acute renal failure superimposed on chronic kidney disease, unspecified CKD stage, unspecified acute renal failure type ([place])  ? ? ? ?Rx / DC Orders  ? ?ED Discharge Orders   ? ? None  ? ?  ? ? ? ?Note:  This document was prepared using Dragon voice recognition software and may include unintentional dictation errors. ?  ?Rada Hay, MD ?01/21/22 1509 ? ?  ?Rada Hay, MD ?01/21/22 1523 ? ?

## 2022-01-22 ENCOUNTER — Inpatient Hospital Stay: Payer: PPO

## 2022-01-22 DIAGNOSIS — Z992 Dependence on renal dialysis: Secondary | ICD-10-CM

## 2022-01-22 DIAGNOSIS — I10 Essential (primary) hypertension: Secondary | ICD-10-CM

## 2022-01-22 DIAGNOSIS — D638 Anemia in other chronic diseases classified elsewhere: Secondary | ICD-10-CM

## 2022-01-22 DIAGNOSIS — Z8673 Personal history of transient ischemic attack (TIA), and cerebral infarction without residual deficits: Secondary | ICD-10-CM

## 2022-01-22 DIAGNOSIS — N186 End stage renal disease: Secondary | ICD-10-CM

## 2022-01-22 DIAGNOSIS — N1832 Chronic kidney disease, stage 3b: Secondary | ICD-10-CM

## 2022-01-22 DIAGNOSIS — R531 Weakness: Principal | ICD-10-CM

## 2022-01-22 DIAGNOSIS — R5381 Other malaise: Secondary | ICD-10-CM | POA: Insufficient documentation

## 2022-01-22 DIAGNOSIS — N179 Acute kidney failure, unspecified: Secondary | ICD-10-CM | POA: Diagnosis not present

## 2022-01-22 LAB — RENAL FUNCTION PANEL
Albumin: 3.5 g/dL (ref 3.5–5.0)
Anion gap: 14 (ref 5–15)
BUN: 99 mg/dL — ABNORMAL HIGH (ref 8–23)
CO2: 18 mmol/L — ABNORMAL LOW (ref 22–32)
Calcium: 7.2 mg/dL — ABNORMAL LOW (ref 8.9–10.3)
Chloride: 105 mmol/L (ref 98–111)
Creatinine, Ser: 9.46 mg/dL — ABNORMAL HIGH (ref 0.44–1.00)
GFR, Estimated: 4 mL/min — ABNORMAL LOW (ref >60)
Glucose, Bld: 131 mg/dL — ABNORMAL HIGH (ref 70–99)
Phosphorus: 9 mg/dL — ABNORMAL HIGH (ref 2.5–4.6)
Potassium: 3.8 mmol/L (ref 3.5–5.1)
Sodium: 137 mmol/L (ref 135–145)

## 2022-01-22 LAB — PREPARE RBC (CROSSMATCH)

## 2022-01-22 LAB — CBC WITH DIFFERENTIAL/PLATELET
Abs Immature Granulocytes: 0.03 10*3/uL (ref 0.00–0.07)
Basophils Absolute: 0 10*3/uL (ref 0.0–0.1)
Basophils Relative: 0 %
Eosinophils Absolute: 0.1 10*3/uL (ref 0.0–0.5)
Eosinophils Relative: 1 %
HCT: 19 % — ABNORMAL LOW (ref 36.0–46.0)
Hemoglobin: 6.3 g/dL — ABNORMAL LOW (ref 12.0–15.0)
Immature Granulocytes: 0 %
Lymphocytes Relative: 25 %
Lymphs Abs: 2.3 10*3/uL (ref 0.7–4.0)
MCH: 29.7 pg (ref 26.0–34.0)
MCHC: 33.2 g/dL (ref 30.0–36.0)
MCV: 89.6 fL (ref 80.0–100.0)
Monocytes Absolute: 0.4 10*3/uL (ref 0.1–1.0)
Monocytes Relative: 5 %
Neutro Abs: 6.2 10*3/uL (ref 1.7–7.7)
Neutrophils Relative %: 69 %
Platelets: 303 10*3/uL (ref 150–400)
RBC: 2.12 MIL/uL — ABNORMAL LOW (ref 3.87–5.11)
RDW: 14.3 % (ref 11.5–15.5)
WBC: 9 10*3/uL (ref 4.0–10.5)
nRBC: 0 % (ref 0.0–0.2)

## 2022-01-22 LAB — MAGNESIUM: Magnesium: 1.8 mg/dL (ref 1.7–2.4)

## 2022-01-22 LAB — ABO/RH: ABO/RH(D): A POS

## 2022-01-22 LAB — HIV ANTIBODY (ROUTINE TESTING W REFLEX): HIV Screen 4th Generation wRfx: NONREACTIVE

## 2022-01-22 LAB — HEMOGLOBIN A1C
Hgb A1c MFr Bld: 5.3 % (ref 4.8–5.6)
Mean Plasma Glucose: 105.41 mg/dL

## 2022-01-22 MED ORDER — MIDODRINE HCL 5 MG PO TABS
10.0000 mg | ORAL_TABLET | Freq: Three times a day (TID) | ORAL | Status: DC | PRN
Start: 2022-01-22 — End: 2022-01-30
  Administered 2022-01-30: 10 mg via ORAL

## 2022-01-22 MED ORDER — SODIUM CHLORIDE 0.9 % IV SOLN: 100.0000 mL | INTRAVENOUS | Status: DC | PRN

## 2022-01-22 MED ORDER — IOHEXOL 9 MG/ML PO SOLN
500.0000 mL | ORAL | Status: AC
  Administered 2022-01-22 (×2): 500 mL via ORAL

## 2022-01-22 MED ORDER — HEPARIN SODIUM (PORCINE) 1000 UNIT/ML DIALYSIS
1000.0000 [IU] | INTRAMUSCULAR | Status: DC | PRN
  Filled 2022-01-22: qty 1

## 2022-01-22 MED ORDER — PENTAFLUOROPROP-TETRAFLUOROETH EX AERO
1.0000 "application " | INHALATION_SPRAY | CUTANEOUS | Status: DC | PRN
  Filled 2022-01-22: qty 30

## 2022-01-22 MED ORDER — LIDOCAINE-PRILOCAINE 2.5-2.5 % EX CREA
1.0000 "application " | TOPICAL_CREAM | CUTANEOUS | Status: DC | PRN
  Filled 2022-01-22: qty 5

## 2022-01-22 MED ORDER — SODIUM CHLORIDE 0.9% IV SOLUTION: Freq: Once | INTRAVENOUS | Status: AC

## 2022-01-22 MED ORDER — DARBEPOETIN ALFA 100 MCG/0.5ML IJ SOSY
100.0000 ug | PREFILLED_SYRINGE | INTRAMUSCULAR | Status: DC
  Administered 2022-01-22 – 2022-01-29 (×2): 100 ug via SUBCUTANEOUS
  Filled 2022-01-22 (×3): qty 0.5

## 2022-01-22 MED ORDER — ALTEPLASE 2 MG IJ SOLR: 2.0000 mg | Freq: Once | INTRAMUSCULAR | Status: DC | PRN

## 2022-01-22 MED ORDER — ENSURE ENLIVE PO LIQD
237.0000 mL | Freq: Two times a day (BID) | ORAL | Status: DC
  Administered 2022-01-22 – 2022-01-24 (×2): 237 mL via ORAL

## 2022-01-22 MED ORDER — ZOLPIDEM TARTRATE 5 MG PO TABS
5.0000 mg | ORAL_TABLET | Freq: Every evening | ORAL | Status: DC | PRN
  Administered 2022-01-22 – 2022-01-29 (×7): 5 mg via ORAL
  Filled 2022-01-22 (×7): qty 1

## 2022-01-22 MED ORDER — LIDOCAINE HCL (PF) 1 % IJ SOLN
5.0000 mL | INTRAMUSCULAR | Status: DC | PRN
  Filled 2022-01-22: qty 5

## 2022-01-22 MED ORDER — CHLORHEXIDINE GLUCONATE CLOTH 2 % EX PADS
6.0000 | MEDICATED_PAD | Freq: Every day | CUTANEOUS | Status: DC
  Administered 2022-01-24 – 2022-01-30 (×7): 6 via TOPICAL

## 2022-01-22 NOTE — TOC Initial Note (Signed)
Transition of Care (TOC) - Initial/Assessment Note  ? ? ?Patient Details  ?Name: Anita Greene ?MRN: [id number] ?Date of Birth: 1951/08/26 ? ?Transition of Care (TOC) CM/SW Contact:    ?Conception Oms, RN ?Phone Number: ?01/22/2022, 10:34 AM ? ?Clinical Narrative:             ?Transition of Care (TOC) Screening Note ? ? ?Patient Details  ?Name: Anita Greene ?Date of Birth: 1950/11/19 ? ? ?Transition of Care (TOC) CM/SW Contact:    ?Conception Oms, RN ?Phone Number: ?01/22/2022, 10:34 AM ? ?From home, has PCP, Has Ins, gets medication at [facility] ? ?Transition of Care Department [facility]) has reviewed patient and no TOC needs have been identified at this time. We will continue to monitor patient advancement through interdisciplinary progression rounds. If new patient transition needs arise, please place a TOC consult. ?      ? ? ?  ?  ? ? ?Patient Goals and CMS Choice ?  ?  ?  ? ?Expected Discharge Plan and Services ?  ?  ?  ?  ?  ?                ?  ?  ?  ?  ?  ?  ?  ?  ?  ?  ? ?Prior Living Arrangements/Services ?  ?  ?  ?       ?  ?  ?  ?  ? ?Activities of Daily Living ?Home Assistive Devices/Equipment: None ?ADL Screening (condition at time of admission) ?Patient's cognitive ability adequate to safely complete daily activities?: Yes ?Is the patient deaf or have difficulty hearing?: No ?Does the patient have difficulty seeing, even when wearing glasses/contacts?: Yes ?Does the patient have difficulty concentrating, remembering, or making decisions?: No ?Patient able to express need for assistance with ADLs?: Yes ?Does the patient have difficulty dressing or bathing?: No ?Independently performs ADLs?: Yes (appropriate for developmental age) ?Does the patient have difficulty walking or climbing stairs?: Yes ?Weakness of Legs: Both ?Weakness of Arms/Hands: Both ? ?Permission Sought/Granted ?  ?  ?   ?   ?   ?   ? ?Emotional Assessment ?  ?  ?  ?  ?  ?  ? ?Admission diagnosis:  Weakness [R53.1] ?Acute  renal failure (ARF) (HCC) [N17.9] ?Acute renal failure superimposed on chronic kidney disease, unspecified CKD stage, unspecified acute renal failure type ([place]) [N17.9, N18.9] ?Patient Active Problem List  ? Diagnosis Date Noted  ? Acute renal failure (ARF) ([place]) 01/21/2022  ? Myalgia 03/26/2017  ? Chronic kidney disease due to type 2 diabetes mellitus ([place]) 03/26/2017  ? Iron deficiency anemia 03/26/2017  ? Advance directive discussed with patient 12/15/2016  ? Preventative health care 06/19/2016  ? Type 2 diabetes mellitus with neurological manifestations, controlled ([place])   ? Fibromyalgia   ? Hemiparesis affecting right side as late effect of cerebrovascular accident (CVA) ([place])   ? Migraine headache   ? IBS (irritable bowel syndrome)   ? Mood disorder ([place])   ? Obstructive sleep apnea   ? Sleep disturbance   ? GERD (gastroesophageal reflux disease)   ? ?PCP:  Maryland Pink, MD ?Pharmacy:   ?[facility] ?[place] ?[place] [address] ?Phone: [phone] Fax: [phone] ? ? ? ? ?Social Determinants of Health (SDOH) Interventions ?  ? ?Readmission Risk Interventions ?   ? View : No data to display.  ?  ?  ?  ? ? ? ?

## 2022-01-22 NOTE — Progress Notes (Signed)
[facility] ?[place], [place] ?01/22/22 ? ?Subjective:  ? ?Hospital day # 1 ? ?Patient known to our practice from outpatient follow-up. ?She presents to the emergency room for not feeling well for the past 2 to 3 weeks.  She had lower extremity weakness and states that she was not able to walk even to 3 steps. ? ?Lab evaluation in the emergency room showed creatinine of 10.5, GFR of 4 ?Nephrology consult has now been requested for evaluation for dialysis. ?In addition patient's labs showed severe acidosis with CO2 of 11.  It has improved to 18 today. ?In addition her hemoglobin is noted to be severely low at 6.3. ? ? ? ?Objective:  ?Vital signs in last 24 hours:  ?Temp:  [98 ?F (36.7 ?C)-98.4 ?F (36.9 ?C)] 98.3 ?F (36.8 ?C) (03/30 1147) ?Pulse Rate:  [80-101] 85 (03/30 1147) ?Resp:  [15-19] 17 (03/30 1147) ?BP: (105-147)/(67-91) 128/71 (03/30 1147) ?SpO2:  [98 %-100 %] 98 % (03/30 1147) ?Weight:  [59.5 kg-62.7 kg] 62.7 kg (03/30 0550) ? ?Weight change:  ?Filed Weights  ? 01/21/22 2034 01/22/22 0550  ?Weight: 59.5 kg 62.7 kg  ? ? ?Intake/Output: ?  ? ?Intake/Output Summary (Last 24 hours) at 01/22/2022 1405 ?Last data filed at 01/22/2022 0700 ?Gross per 24 hour  ?Intake 659.2 ml  ?Output 600 ml  ?Net 59.2 ml  ? ? ?Physical Exam: ?General:  No acute distress, laying in the bed  ?HEENT  anicteric, moist oral mucous membrane  ?Pulm/lungs  normal breathing effort, lungs are clear to auscultation  ?CVS/Heart  regular rhythm, no rub or gallop  ?Abdomen:   Soft, nontender  ?Extremities:  No peripheral edema  ?Neurologic:  Alert, oriented, able to follow commands  ?Skin:  No acute rashes  ? ? ?Basic Metabolic Panel: ? ?Recent Labs  ?Lab 01/21/22 ?1036 01/21/22 ?1214 01/22/22 ?0915  ?NA 134*  --  137  ?K 3.7  --  3.8  ?CL 106  --  105  ?CO2 11*  --  18*  ?GLUCOSE 133*  --  131*  ?BUN 99*  --  99*  ?CREATININE 10.53*  --  9.46*  ?CALCIUM 7.3*  --  7.2*  ?MG  --  1.4* 1.8  ?PHOS  --  9.6* 9.0*   ? ? ? ?CBC: ?Recent Labs  ?Lab 01/21/22 ?1036 01/22/22 ?0915  ?WBC 11.4* 9.0  ?NEUTROABS  --  6.2  ?HGB 7.9* 6.3*  ?HCT 24.4* 19.0*  ?MCV 92.8 89.6  ?PLT 339 303  ? ? ? No results found for: HEPBSAG, HEPBSAB, HEPBIGM ? ? ? ?Microbiology: ? ?Recent Results (from the past 240 hour(s))  ?Resp Panel by RT-PCR (Flu A&B, Covid) Nasopharyngeal Swab     Status: None  ? Collection Time: 01/21/22  3:47 PM  ? Specimen: Nasopharyngeal Swab; Nasopharyngeal(NP) swabs in vial transport medium  ?Result Value Ref Range Status  ? SARS Coronavirus 2 by RT PCR NEGATIVE NEGATIVE Final  ?  Comment: (NOTE) ?SARS-CoV-2 target nucleic acids are NOT DETECTED. ? ?The SARS-CoV-2 RNA is generally detectable in upper respiratory ?specimens during the acute phase of infection. The lowest ?concentration of SARS-CoV-2 viral copies this assay can detect is ?138 copies/mL. A negative result does not preclude SARS-Cov-2 ?infection and should not be used as the sole basis for treatment or ?other patient management decisions. A negative result may occur with  ?improper specimen collection/handling, submission of specimen other ?than nasopharyngeal swab, presence of viral mutation(s) within the ?areas targeted by this assay, and inadequate  number of viral ?copies(<138 copies/mL). A negative result must be combined with ?clinical observations, patient history, and epidemiological ?information. The expected result is Negative. ? ?Fact Sheet for Patients:  ?EntrepreneurPulse.com.au ? ?Fact Sheet for Healthcare Providers:  ?IncredibleEmployment.be ? ?This test is no t yet approved or cleared by the [place] FDA and  ?has been authorized for detection and/or diagnosis of SARS-CoV-2 by ?FDA under an Emergency Use Authorization (EUA). This EUA will remain  ?in effect (meaning this test can be used) for the duration of the ?COVID-19 declaration under Section 564(b)(1) of the Act, 21 ?U.S.C.section 360bbb-3(b)(1), unless  the authorization is terminated  ?or revoked sooner.  ? ? ?  ? Influenza A by PCR NEGATIVE NEGATIVE Final  ? Influenza B by PCR NEGATIVE NEGATIVE Final  ?  Comment: (NOTE) ?The Xpert Xpress SARS-CoV-2/FLU/RSV plus assay is intended as an aid ?in the diagnosis of influenza from Nasopharyngeal swab specimens and ?should not be used as a sole basis for treatment. Nasal washings and ?aspirates are unacceptable for Xpert Xpress SARS-CoV-2/FLU/RSV ?testing. ? ?Fact Sheet for Patients: ?EntrepreneurPulse.com.au ? ?Fact Sheet for Healthcare Providers: ?IncredibleEmployment.be ? ?This test is not yet approved or cleared by the [place] FDA and ?has been authorized for detection and/or diagnosis of SARS-CoV-2 by ?FDA under an Emergency Use Authorization (EUA). This EUA will remain ?in effect (meaning this test can be used) for the duration of the ?COVID-19 declaration under Section 564(b)(1) of the Act, 21 U.S.C. ?section 360bbb-3(b)(1), unless the authorization is terminated or ?revoked. ? ?Performed at [facility], [place], ?[place] [address] ?  ? ? ?Coagulation Studies: ?No results for input(s): LABPROT, INR in the last 72 hours. ? ?Urinalysis: ?Recent Labs  ?  01/21/22 ?1900  ?COLORURINE YELLOW*  ?LABSPEC 1.009  ?PHURINE 5.0  ?GLUCOSEU NEGATIVE  ?HGBUR SMALL*  ?BILIRUBINUR NEGATIVE  ?KETONESUR NEGATIVE  ?PROTEINUR NEGATIVE  ?NITRITE NEGATIVE  ?LEUKOCYTESUR LARGE*  ?  ? ? ?Imaging: ?DG Chest 2 View ? ?Result Date: 01/21/2022 ?CLINICAL DATA:  Shortness of breath EXAM: CHEST - 2 VIEW COMPARISON:  04/12/2018 FINDINGS: No focal consolidation. No pleural effusion or pneumothorax. Heart and mediastinal contours are unremarkable. Thoracic aortic atherosclerosis. No acute osseous abnormality. IMPRESSION: No active cardiopulmonary disease. Electronically Signed   By: Kathreen Devoid M.D.   On: 01/21/2022 14:29  ? ?CT HEAD WO CONTRAST (5MM) ? ?Result Date:  01/21/2022 ?CLINICAL DATA:  Motor neuron disease suspected. Weakness and kidney issues. EXAM: CT HEAD WITHOUT CONTRAST TECHNIQUE: Contiguous axial images were obtained from the base of the skull through the vertex without intravenous contrast. RADIATION DOSE REDUCTION: This exam was performed according to the departmental dose-optimization program which includes automated exposure control, adjustment of the mA and/or kV according to patient size and/or use of iterative reconstruction technique. COMPARISON:  None available FINDINGS: Brain: No evidence for acute hemorrhage, mass lesion, midline shift, hydrocephalus or large infarct. Scattered areas of low-density in the white matter. Focal low-density near the base of left basal ganglia could represent a dilated perivascular space versus lacune infarct. Vascular: No hyperdense vessel or unexpected calcification. Skull: Normal. Negative for fracture or focal lesion. Sinuses/Orbits: Visualized sinuses are clear. Other: None. IMPRESSION: 1. No acute intracranial abnormality. 2. Patchy areas of low-density in the white matter are nonspecific but could represent chronic small vessel ischemic changes. Electronically Signed   By: Markus Daft M.D.   On: 01/21/2022 16:39  ? ?CT CERVICAL SPINE WO CONTRAST ? ?Result Date: 01/21/2022 ?CLINICAL DATA:  Weakness and lower back pain. EXAM: CT CERVICAL SPINE WITHOUT CONTRAST TECHNIQUE: Multidetector CT imaging of the cervical spine was performed without intravenous contrast. Multiplanar CT image reconstructions were also generated. RADIATION DOSE REDUCTION: This exam was performed according to the departmental dose-optimization program which includes automated exposure control, adjustment of the mA and/or kV according to patient size and/or use of iterative reconstruction technique. COMPARISON:  None. FINDINGS: Alignment: Normal. Skull base and vertebrae: No acute fracture. Chronic and degenerative changes are seen involving the tip of  the dens and adjacent portion of the anterior arch of C1. Soft tissues and spinal canal: No prevertebral fluid or swelling. No visible canal hematoma. Disc levels: Mild endplate sclerosis and mild anterior osteophyte formation are se

## 2022-01-22 NOTE — Progress Notes (Signed)
?Progress Note ? ? ? ?Anita Greene   ?ZOX:[id number]  ?DOB: 10-19-1951  ?DOA: 01/21/2022     1 ?PCP: Maryland Pink, MD ? ?Initial CC: weakness, shaking ? ?Hospital Course: ?Ms. Anita Greene is a 71 yo female with PMH CKD3, GERD, B12 deficiency, HTN, IDA, OSA, RLS, CVA, DMII who presented with progressively worsening fatigue/malaise, lower extremity weakness, and hand tremors.  She is followed closely outpatient by nephrology and appears to have had rapidly progressive worsening renal function.  Per nephrology, renal biopsy October 2022 showed arterionephrosclerosis, severe global glomerulosclerosis, interstitial fibrosis. ?She was admitted to have dialysis initiated inpatient. ? ?Interval History:  ?Seen in her room this afternoon.  Her tremors have improved some since admission.  Still just describes generalized weakness.  She understands plan is for initiating on dialysis during hospitalization. ? ?Assessment and Plan: ?* Acute renal failure superimposed on stage 3b chronic kidney disease ([place]) ?- Rapidly progressive renal failure; she is followed closely outpatient by nephrology.  Suspicion is that she has progressed to ESRD ?- Patient presented with weakness/fatigue and UE tremor ?- labs show rapid progression of renal failure; appreciate nephrology evaluation ?- plan is to start inpatient HD; vascular surgery consulted for permcath placement  ? ?Anemia of chronic disease ?- Likely mixed etiology with history of iron deficiency anemia and worsening renal function ?- Repeat hemoglobin down to 6.3 g/dL this morning ?- 1 unit PRBC ordered ?- Iron studies on admission considered adequate ? ?HTN (hypertension) ?- continue midodrine PRN ?-Verapamil and lisinopril/hctz on hold ? ?Hypomagnesemia ?- Replete as needed ? ?History of CVA (cerebrovascular accident) ?- continue asa ? ?Weakness ?- suspected due to renal failure ?- CT C/L spine on admission: mild/mod DDD in C spine. No acute findings in L spine. CTH showed patchy  areas of chronic small vessel disease  ?- monitor response after HD started ? ?GERD (gastroesophageal reflux disease) ?- Continue PPI ? ?Obstructive sleep apnea ?- Continue nightly CPAP ? ?Mood disorder ([place]) ?- Continue Wellbutrin, BuSpar, Cymbalta ? ?Type 2 diabetes mellitus with neurological manifestations, controlled ([place]) ?- continue SSI and CBG monitoring  ? ? ?Old records reviewed in assessment of this patient ? ?Antimicrobials: ? ? ?DVT prophylaxis:  ?SCDs Start: 01/21/22 1612 ? ? ?Code Status:   Code Status: Full Code ? ?Disposition Plan:  Home in 3-4 days ?Status is: Inpt ? ?Objective: ?Blood pressure 128/71, pulse 85, temperature 98.3 ?F (36.8 ?C), resp. rate 17, height '5\' 1"'$  (1.549 m), weight 62.7 kg, SpO2 98 %.  ?Examination:  ?Physical Exam ?Constitutional:   ?   Appearance: Normal appearance.  ?HENT:  ?   Head: Normocephalic and atraumatic.  ?   Mouth/Throat:  ?   Mouth: Mucous membranes are moist.  ?Eyes:  ?   Extraocular Movements: Extraocular movements intact.  ?Cardiovascular:  ?   Rate and Rhythm: Normal rate and regular rhythm.  ?Pulmonary:  ?   Effort: Pulmonary effort is normal.  ?   Breath sounds: Normal breath sounds.  ?Abdominal:  ?   General: Bowel sounds are normal. There is no distension.  ?   Palpations: Abdomen is soft.  ?   Tenderness: There is no abdominal tenderness.  ?Musculoskeletal:     ?   General: Normal range of motion.  ?   Cervical back: Normal range of motion and neck supple.  ?Skin: ?   General: Skin is warm and dry.  ?Neurological:  ?   Mental Status: She is alert.  ?   Comments: Very  mild resting tremor, no obvious asterixis.  Otherwise no other focal deficits  ?Psychiatric:     ?   Mood and Affect: Mood normal.     ?   Behavior: Behavior normal.  ?  ? ?Consultants:  ?Nephrology ?Vascular surgery ? ?Procedures:  ? ? ?Data Reviewed: ?Results for orders placed or performed during the hospital encounter of 01/21/22 (from the past 24 hour(s))  ?Resp Panel by RT-PCR (Flu A&B,  Covid) Nasopharyngeal Swab     Status: None  ? Collection Time: 01/21/22  3:47 PM  ? Specimen: Nasopharyngeal Swab; Nasopharyngeal(NP) swabs in vial transport medium  ?Result Value Ref Range  ? SARS Coronavirus 2 by RT PCR NEGATIVE NEGATIVE  ? Influenza A by PCR NEGATIVE NEGATIVE  ? Influenza B by PCR NEGATIVE NEGATIVE  ?Blood gas, arterial     Status: Abnormal  ? Collection Time: 01/21/22  4:16 PM  ?Result Value Ref Range  ? Delivery systems ROOM AIR   ? pH, Arterial 7.24 (L) 7.35 - 7.45  ? pCO2 arterial 25 (L) 32 - 48 mmHg  ? pO2, Arterial 114 (H) 83 - 108 mmHg  ? Bicarbonate 10.7 (L) 20.0 - 28.0 mmol/L  ? Acid-base deficit 14.9 (H) 0.0 - 2.0 mmol/L  ? O2 Saturation 97.6 %  ? Patient temperature 37.0   ? Collection site RIGHT RADIAL   ? Allens test (pass/fail) PASS PASS  ?Iron and TIBC     Status: None  ? Collection Time: 01/21/22  4:45 PM  ?Result Value Ref Range  ? Iron 95 28 - 170 ug/dL  ? TIBC 330 250 - 450 ug/dL  ? Saturation Ratios 29 10.4 - 31.8 %  ? UIBC 235 ug/dL  ?Ferritin     Status: None  ? Collection Time: 01/21/22  4:45 PM  ?Result Value Ref Range  ? Ferritin 71 11 - 307 ng/mL  ?Folate     Status: None  ? Collection Time: 01/21/22  4:45 PM  ?Result Value Ref Range  ? Folate 17.7 >5.9 ng/mL  ?TSH     Status: Abnormal  ? Collection Time: 01/21/22  4:45 PM  ?Result Value Ref Range  ? TSH 5.655 (H) 0.350 - 4.500 uIU/mL  ?Urinalysis, Routine w reflex microscopic Urine, Clean Catch     Status: Abnormal  ? Collection Time: 01/21/22  7:00 PM  ?Result Value Ref Range  ? Color, Urine YELLOW (A) YELLOW  ? APPearance HAZY (A) CLEAR  ? Specific Gravity, Urine 1.009 1.005 - 1.030  ? pH 5.0 5.0 - 8.0  ? Glucose, UA NEGATIVE NEGATIVE mg/dL  ? Hgb urine dipstick SMALL (A) NEGATIVE  ? Bilirubin Urine NEGATIVE NEGATIVE  ? Ketones, ur NEGATIVE NEGATIVE mg/dL  ? Protein, ur NEGATIVE NEGATIVE mg/dL  ? Nitrite NEGATIVE NEGATIVE  ? Leukocytes,Ua LARGE (A) NEGATIVE  ? RBC / HPF 0-5 0 - 5 RBC/hpf  ? WBC, UA 6-10 0 - 5 WBC/hpf   ? Bacteria, UA RARE (A) NONE SEEN  ? Squamous Epithelial / LPF 0-5 0 - 5  ? Mucus PRESENT   ?HIV Antibody (routine testing w rflx)     Status: None  ? Collection Time: 01/21/22  7:12 PM  ?Result Value Ref Range  ? HIV Screen 4th Generation wRfx Non Reactive Non Reactive  ?C-reactive protein     Status: None  ? Collection Time: 01/21/22  7:12 PM  ?Result Value Ref Range  ? CRP 0.8 <1.0 mg/dL  ?Cortisol, Random     Status: None  ?  Collection Time: 01/21/22  7:12 PM  ?Result Value Ref Range  ? Cortisol, Plasma 28.9 ug/dL  ?Vitamin B12     Status: Abnormal  ? Collection Time: 01/21/22  7:12 PM  ?Result Value Ref Range  ? Vitamin B-12 >7,500 (H) 180 - 914 pg/mL  ?CK     Status: None  ? Collection Time: 01/21/22  7:12 PM  ?Result Value Ref Range  ? Total CK 115 38 - 234 U/L  ?Renal function panel     Status: Abnormal  ? Collection Time: 01/22/22  9:15 AM  ?Result Value Ref Range  ? Sodium 137 135 - 145 mmol/L  ? Potassium 3.8 3.5 - 5.1 mmol/L  ? Chloride 105 98 - 111 mmol/L  ? CO2 18 (L) 22 - 32 mmol/L  ? Glucose, Bld 131 (H) 70 - 99 mg/dL  ? BUN 99 (H) 8 - 23 mg/dL  ? Creatinine, Ser 9.46 (H) 0.44 - 1.00 mg/dL  ? Calcium 7.2 (L) 8.9 - 10.3 mg/dL  ? Phosphorus 9.0 (H) 2.5 - 4.6 mg/dL  ? Albumin 3.5 3.5 - 5.0 g/dL  ? GFR, Estimated 4 (L) >60 mL/min  ? Anion gap 14 5 - 15  ?Magnesium     Status: None  ? Collection Time: 01/22/22  9:15 AM  ?Result Value Ref Range  ? Magnesium 1.8 1.7 - 2.4 mg/dL  ?CBC with Differential/Platelet     Status: Abnormal  ? Collection Time: 01/22/22  9:15 AM  ?Result Value Ref Range  ? WBC 9.0 4.0 - 10.5 K/uL  ? RBC 2.12 (L) 3.87 - 5.11 MIL/uL  ? Hemoglobin 6.3 (L) 12.0 - 15.0 g/dL  ? HCT 19.0 (L) 36.0 - 46.0 %  ? MCV 89.6 80.0 - 100.0 fL  ? MCH 29.7 26.0 - 34.0 pg  ? MCHC 33.2 30.0 - 36.0 g/dL  ? RDW 14.3 11.5 - 15.5 %  ? Platelets 303 150 - 400 K/uL  ? nRBC 0.0 0.0 - 0.2 %  ? Neutrophils Relative % 69 %  ? Neutro Abs 6.2 1.7 - 7.7 K/uL  ? Lymphocytes Relative 25 %  ? Lymphs Abs 2.3 0.7 - 4.0  K/uL  ? Monocytes Relative 5 %  ? Monocytes Absolute 0.4 0.1 - 1.0 K/uL  ? Eosinophils Relative 1 %  ? Eosinophils Absolute 0.1 0.0 - 0.5 K/uL  ? Basophils Relative 0 %  ? Basophils Absolute 0.0 0.0 -

## 2022-01-22 NOTE — H&P (View-Only) (Signed)
[facility] ?[place], [place] ?01/22/22 ? ?Subjective:  ? ?Hospital day # 1 ? ?Patient known to our practice from outpatient follow-up. ?She presents to the emergency room for not feeling well for the past 2 to 3 weeks.  She had lower extremity weakness and states that she was not able to walk even to 3 steps. ? ?Lab evaluation in the emergency room showed creatinine of 10.5, GFR of 4 ?Nephrology consult has now been requested for evaluation for dialysis. ?In addition patient's labs showed severe acidosis with CO2 of 11.  It has improved to 18 today. ?In addition her hemoglobin is noted to be severely low at 6.3. ? ? ? ?Objective:  ?Vital signs in last 24 hours:  ?Temp:  [98 ?F (36.7 ?C)-98.4 ?F (36.9 ?C)] 98.3 ?F (36.8 ?C) (03/30 1147) ?Pulse Rate:  [80-101] 85 (03/30 1147) ?Resp:  [15-19] 17 (03/30 1147) ?BP: (105-147)/(67-91) 128/71 (03/30 1147) ?SpO2:  [98 %-100 %] 98 % (03/30 1147) ?Weight:  [59.5 kg-62.7 kg] 62.7 kg (03/30 0550) ? ?Weight change:  ?Filed Weights  ? 01/21/22 2034 01/22/22 0550  ?Weight: 59.5 kg 62.7 kg  ? ? ?Intake/Output: ?  ? ?Intake/Output Summary (Last 24 hours) at 01/22/2022 1405 ?Last data filed at 01/22/2022 0700 ?Gross per 24 hour  ?Intake 659.2 ml  ?Output 600 ml  ?Net 59.2 ml  ? ? ?Physical Exam: ?General:  No acute distress, laying in the bed  ?HEENT  anicteric, moist oral mucous membrane  ?Pulm/lungs  normal breathing effort, lungs are clear to auscultation  ?CVS/Heart  regular rhythm, no rub or gallop  ?Abdomen:   Soft, nontender  ?Extremities:  No peripheral edema  ?Neurologic:  Alert, oriented, able to follow commands  ?Skin:  No acute rashes  ? ? ?Basic Metabolic Panel: ? ?Recent Labs  ?Lab 01/21/22 ?1036 01/21/22 ?1214 01/22/22 ?0915  ?NA 134*  --  137  ?K 3.7  --  3.8  ?CL 106  --  105  ?CO2 11*  --  18*  ?GLUCOSE 133*  --  131*  ?BUN 99*  --  99*  ?CREATININE 10.53*  --  9.46*  ?CALCIUM 7.3*  --  7.2*  ?MG  --  1.4* 1.8  ?PHOS  --  9.6* 9.0*   ? ? ? ?CBC: ?Recent Labs  ?Lab 01/21/22 ?1036 01/22/22 ?0915  ?WBC 11.4* 9.0  ?NEUTROABS  --  6.2  ?HGB 7.9* 6.3*  ?HCT 24.4* 19.0*  ?MCV 92.8 89.6  ?PLT 339 303  ? ? ? No results found for: HEPBSAG, HEPBSAB, HEPBIGM ? ? ? ?Microbiology: ? ?Recent Results (from the past 240 hour(s))  ?Resp Panel by RT-PCR (Flu A&B, Covid) Nasopharyngeal Swab     Status: None  ? Collection Time: 01/21/22  3:47 PM  ? Specimen: Nasopharyngeal Swab; Nasopharyngeal(NP) swabs in vial transport medium  ?Result Value Ref Range Status  ? SARS Coronavirus 2 by RT PCR NEGATIVE NEGATIVE Final  ?  Comment: (NOTE) ?SARS-CoV-2 target nucleic acids are NOT DETECTED. ? ?The SARS-CoV-2 RNA is generally detectable in upper respiratory ?specimens during the acute phase of infection. The lowest ?concentration of SARS-CoV-2 viral copies this assay can detect is ?138 copies/mL. A negative result does not preclude SARS-Cov-2 ?infection and should not be used as the sole basis for treatment or ?other patient management decisions. A negative result may occur with  ?improper specimen collection/handling, submission of specimen other ?than nasopharyngeal swab, presence of viral mutation(s) within the ?areas targeted by this assay, and inadequate  number of viral ?copies(<138 copies/mL). A negative result must be combined with ?clinical observations, patient history, and epidemiological ?information. The expected result is Negative. ? ?Fact Sheet for Patients:  ?EntrepreneurPulse.com.au ? ?Fact Sheet for Healthcare Providers:  ?IncredibleEmployment.be ? ?This test is no t yet approved or cleared by the [place] FDA and  ?has been authorized for detection and/or diagnosis of SARS-CoV-2 by ?FDA under an Emergency Use Authorization (EUA). This EUA will remain  ?in effect (meaning this test can be used) for the duration of the ?COVID-19 declaration under Section 564(b)(1) of the Act, 21 ?U.S.C.section 360bbb-3(b)(1), unless  the authorization is terminated  ?or revoked sooner.  ? ? ?  ? Influenza A by PCR NEGATIVE NEGATIVE Final  ? Influenza B by PCR NEGATIVE NEGATIVE Final  ?  Comment: (NOTE) ?The Xpert Xpress SARS-CoV-2/FLU/RSV plus assay is intended as an aid ?in the diagnosis of influenza from Nasopharyngeal swab specimens and ?should not be used as a sole basis for treatment. Nasal washings and ?aspirates are unacceptable for Xpert Xpress SARS-CoV-2/FLU/RSV ?testing. ? ?Fact Sheet for Patients: ?EntrepreneurPulse.com.au ? ?Fact Sheet for Healthcare Providers: ?IncredibleEmployment.be ? ?This test is not yet approved or cleared by the [place] FDA and ?has been authorized for detection and/or diagnosis of SARS-CoV-2 by ?FDA under an Emergency Use Authorization (EUA). This EUA will remain ?in effect (meaning this test can be used) for the duration of the ?COVID-19 declaration under Section 564(b)(1) of the Act, 21 U.S.C. ?section 360bbb-3(b)(1), unless the authorization is terminated or ?revoked. ? ?Performed at [facility], [place], ?[place] [address] ?  ? ? ?Coagulation Studies: ?No results for input(s): LABPROT, INR in the last 72 hours. ? ?Urinalysis: ?Recent Labs  ?  01/21/22 ?1900  ?COLORURINE YELLOW*  ?LABSPEC 1.009  ?PHURINE 5.0  ?GLUCOSEU NEGATIVE  ?HGBUR SMALL*  ?BILIRUBINUR NEGATIVE  ?KETONESUR NEGATIVE  ?PROTEINUR NEGATIVE  ?NITRITE NEGATIVE  ?LEUKOCYTESUR LARGE*  ?  ? ? ?Imaging: ?DG Chest 2 View ? ?Result Date: 01/21/2022 ?CLINICAL DATA:  Shortness of breath EXAM: CHEST - 2 VIEW COMPARISON:  04/12/2018 FINDINGS: No focal consolidation. No pleural effusion or pneumothorax. Heart and mediastinal contours are unremarkable. Thoracic aortic atherosclerosis. No acute osseous abnormality. IMPRESSION: No active cardiopulmonary disease. Electronically Signed   By: Kathreen Devoid M.D.   On: 01/21/2022 14:29  ? ?CT HEAD WO CONTRAST (5MM) ? ?Result Date:  01/21/2022 ?CLINICAL DATA:  Motor neuron disease suspected. Weakness and kidney issues. EXAM: CT HEAD WITHOUT CONTRAST TECHNIQUE: Contiguous axial images were obtained from the base of the skull through the vertex without intravenous contrast. RADIATION DOSE REDUCTION: This exam was performed according to the departmental dose-optimization program which includes automated exposure control, adjustment of the mA and/or kV according to patient size and/or use of iterative reconstruction technique. COMPARISON:  None available FINDINGS: Brain: No evidence for acute hemorrhage, mass lesion, midline shift, hydrocephalus or large infarct. Scattered areas of low-density in the white matter. Focal low-density near the base of left basal ganglia could represent a dilated perivascular space versus lacune infarct. Vascular: No hyperdense vessel or unexpected calcification. Skull: Normal. Negative for fracture or focal lesion. Sinuses/Orbits: Visualized sinuses are clear. Other: None. IMPRESSION: 1. No acute intracranial abnormality. 2. Patchy areas of low-density in the white matter are nonspecific but could represent chronic small vessel ischemic changes. Electronically Signed   By: Markus Daft M.D.   On: 01/21/2022 16:39  ? ?CT CERVICAL SPINE WO CONTRAST ? ?Result Date: 01/21/2022 ?CLINICAL DATA:  Weakness and lower back pain. EXAM: CT CERVICAL SPINE WITHOUT CONTRAST TECHNIQUE: Multidetector CT imaging of the cervical spine was performed without intravenous contrast. Multiplanar CT image reconstructions were also generated. RADIATION DOSE REDUCTION: This exam was performed according to the departmental dose-optimization program which includes automated exposure control, adjustment of the mA and/or kV according to patient size and/or use of iterative reconstruction technique. COMPARISON:  None. FINDINGS: Alignment: Normal. Skull base and vertebrae: No acute fracture. Chronic and degenerative changes are seen involving the tip of  the dens and adjacent portion of the anterior arch of C1. Soft tissues and spinal canal: No prevertebral fluid or swelling. No visible canal hematoma. Disc levels: Mild endplate sclerosis and mild anterior osteophyte formation are se

## 2022-01-22 NOTE — Hospital Course (Addendum)
Anita Greene is a 71 yo female with PMH CKD3, GERD, B12 deficiency, HTN, IDA, OSA, RLS, CVA, DMII who presented with progressively worsening fatigue/malaise, lower extremity weakness, and hand tremors.  She is followed closely outpatient by nephrology and appears to have had rapidly progressive worsening renal function.  Per nephrology, renal biopsy October 2022 showed arterionephrosclerosis, severe global glomerulosclerosis, interstitial fibrosis. ?She was admitted to have dialysis initiated inpatient.  She underwent PermCath placement followed by initial dialysis session on 01/23/2022. ?

## 2022-01-22 NOTE — Assessment & Plan Note (Signed)
-  Continue nightly CPAP °

## 2022-01-22 NOTE — Assessment & Plan Note (Signed)
-   continue midodrine PRN ?-Verapamil and lisinopril/hctz on hold ?

## 2022-01-22 NOTE — NC FL2 (Signed)
?[place] MEDICAID FL2 LEVEL OF CARE SCREENING TOOL  ?  ? ?IDENTIFICATION  ?Patient Name: ?Anita Greene Birthdate: [birth year] Sex: female Admission Date (Current Location): ?01/21/2022  ?[place] and [place] Number: ? [place] ?  Facility and Address:  ?[facility], [address], [place],  [address] ?     Provider Number: ?[id number]  ?Attending Physician Name and Address:  ?Dwyane Dee, MD ? Relative Name and Phone Number:  ?Hilaria Ota [phone] ?   ?Current Level of Care: ?Hospital Recommended Level of Care: ?[place] Prior Approval Number: ?  ? ?Date Approved/Denied: ?  PASRR Number: ?[id number] ? ?Discharge Plan: ?SNF ?  ? ?Current Diagnoses: ?Patient Active Problem List  ? Diagnosis Date Noted  ? Weakness 01/22/2022  ? History of CVA (cerebrovascular accident) 01/22/2022  ? Hypomagnesemia 01/22/2022  ? HTN (hypertension) 01/22/2022  ? Anemia of chronic disease 01/22/2022  ? Acute renal failure superimposed on stage 3b chronic kidney disease ([place]) 01/21/2022  ? Myalgia 03/26/2017  ? Chronic kidney disease due to type 2 diabetes mellitus ([place]) 03/26/2017  ? Iron deficiency anemia 03/26/2017  ? Advance directive discussed with patient 12/15/2016  ? Preventative health care 06/19/2016  ? Type 2 diabetes mellitus with neurological manifestations, controlled ([place])   ? Fibromyalgia   ? Hemiparesis affecting right side as late effect of cerebrovascular accident (CVA) ([place])   ? Migraine headache   ? IBS (irritable bowel syndrome)   ? Mood disorder ([place])   ? Obstructive sleep apnea   ? Sleep disturbance   ? GERD (gastroesophageal reflux disease)   ? ? ?Orientation RESPIRATION BLADDER Height & Weight   ?  ?Self, Time, Situation, Place ? Normal Continent Weight: 62.7 kg ?Height:  '5\' 1"'$  (154.9 cm)  ?BEHAVIORAL SYMPTOMS/MOOD NEUROLOGICAL BOWEL NUTRITION STATUS  ?    Continent Diet (SEE DC SUMMARY)  ?AMBULATORY STATUS COMMUNICATION OF NEEDS Skin   ?Extensive Assist  Verbally Normal ?  ?  ?  ?    ?     ?     ? ? ?Personal Care Assistance Level of Assistance  ?Bathing, Feeding, Dressing Bathing Assistance: Limited assistance ?Feeding assistance: Independent ?Dressing Assistance: Limited assistance ?   ? ?Functional Limitations Info  ?    ?  ?   ? ? ?SPECIAL CARE FACTORS FREQUENCY  ?PT (By licensed PT), OT (By licensed OT)   ?  ?PT Frequency: 5 times per week ?OT Frequency: 5 times per week ?  ?  ?  ?   ? ? ?Contractures Contractures Info: Not present  ? ? ?Additional Factors Info  ?Code Status, Allergies Code Status Info: full code ?Allergies Info: Cefditoren Pivoxil, Clarithromycin, Codeine Sulfate, Erythromycin, Other, Pseudoephedrine, Sulfa Antibiotics ?  ?  ?  ?   ? ?Current Medications (01/22/2022):  This is the current hospital active medication list ?Current Facility-Administered Medications  ?Medication Dose Route Frequency Provider Last Rate Last Admin  ? 0.9 %  sodium chloride infusion (Manually program via Guardrails IV Fluids)   Intravenous Once Dwyane Dee, MD      ? acetaminophen (TYLENOL) tablet 650 mg  650 mg Oral Q6H PRN Clance Boll, MD      ? Or  ? acetaminophen (TYLENOL) suppository 650 mg  650 mg Rectal Q6H PRN Clance Boll, MD      ? albuterol (PROVENTIL) (2.5 MG/3ML) 0.083% nebulizer solution 2.5 mg  2.5 mg Nebulization Q2H PRN Clance Boll, MD      ? aspirin EC  tablet 81 mg  81 mg Oral QHS Clance Boll, MD   81 mg at 01/21/22 2206  ? buPROPion (WELLBUTRIN XL) 24 hr tablet 300 mg  300 mg Oral Daily Myles Rosenthal A, MD   300 mg at 01/22/22 0900  ? busPIRone (BUSPAR) tablet 15 mg  15 mg Oral TID Myles Rosenthal A, MD   15 mg at 01/22/22 0900  ? calcium acetate (PHOSLO) capsule 1,334 mg  1,334 mg Oral TID WC Clance Boll, MD   1,334 mg at 01/22/22 1220  ? [START ON 01/23/2022] Chlorhexidine Gluconate Cloth 2 % PADS 6 each  6 each Topical Q0600 Colon Flattery, NP      ? Darbepoetin Alfa (ARANESP) injection 100 mcg   100 mcg Subcutaneous Q Thu-1800 Singh, Harmeet, MD      ? DULoxetine (CYMBALTA) DR capsule 30 mg  30 mg Oral Daily Myles Rosenthal A, MD   30 mg at 01/22/22 0900  ? feeding supplement (ENSURE ENLIVE / ENSURE PLUS) liquid 237 mL  237 mL Oral BID BM Dwyane Dee, MD      ? ferrous sulfate tablet 325 mg  325 mg Oral BID WC Myles Rosenthal A, MD   325 mg at 01/22/22 0900  ? iohexol (OMNIPAQUE) 9 MG/ML oral solution 500 mL  500 mL Oral Q1H Pabon, Diego F, MD      ? midodrine (PROAMATINE) tablet 10 mg  10 mg Oral TID PRN Dwyane Dee, MD      ? sodium chloride flush (NS) 0.9 % injection 3 mL  3 mL Intravenous Q12H Myles Rosenthal A, MD   3 mL at 01/21/22 2150  ? ? ? ?Discharge Medications: ?Please see discharge summary for a list of discharge medications. ? ?Relevant Imaging Results: ? ?Relevant Lab Results: ? ? ?Additional Information ?SS# [id number] ? ?Conception Oms, RN ? ? ? ? ?

## 2022-01-22 NOTE — Assessment & Plan Note (Addendum)
-   suspected due to renal failure ?- CT C/L spine on admission: mild/mod DDD in C spine. No acute findings in L spine. CTH showed patchy areas of chronic small vessel disease  ?- monitor response after HD started: Does note improvement in her weakness ?- Continue PT while hospitalized; currently SNF recommended and patient/son want her to go to rehab as well; this might affect timing/location with HD, will discuss with CM and staff on Monday ?

## 2022-01-22 NOTE — Plan of Care (Signed)

## 2022-01-22 NOTE — Assessment & Plan Note (Signed)
-   continue SSI and CBG monitoring  

## 2022-01-22 NOTE — Evaluation (Signed)
Physical Therapy Evaluation ?Patient Details ?Name: Anita Greene ?MRN: [id number] ?DOB: [birth year] ?Today's Date: 01/22/2022 ? ?History of Present Illness ? An[facility] is a 71 y.o. female   with past medical history of low back pain, fibromyalgia, diabetes, CKD  nephritis who follows with nephrology, frequent UTIs who presents with generalized weakness and worsening kidney function. ? ?  ?Clinical Impression ? Pt received in Semi-Fowler's position and agreeable to therapy.  Pt performed bed-level exercises with good technique, then transitioned to sitting EOB, requiring CGA for safety due to pt having sporadic movements of the LE's.  Pt then performed STS to come upright, requiring min-modA for stability purposes due to dizziness upon standing.  Once dizziness subsided, pt then transitioned to ambulating around the nursing station.  Pt would become SOB at times, and also experienced knees buckling at times that would require assistance for pt to remain in standing position.  Pt able to ambulate back to room and sit in chair at conclusion of the session.  Due to instability at this time and reliance on UE support when ambulating (which are generally weak 4/5 as well), pt would benefit from STR stint at [facility] facility.  Pt will benefit from skilled PT intervention to increase independence and safety with basic mobility in preparation for discharge to SNF.   ? ?   ? ?Recommendations for follow up therapy are one component of a multi-disciplinary discharge planning process, led by the attending physician.  Recommendations may be updated based on patient status, additional functional criteria and insurance authorization. ? ?Follow Up Recommendations Skilled nursing-short term rehab (<3 hours/day) ? ?  ?Assistance Recommended at Discharge Frequent or constant Supervision/Assistance  ?Patient can return home with the following ? A little help with walking and/or transfers;A little help with  bathing/dressing/bathroom;Assistance with cooking/housework ? ?  ?Equipment Recommendations Rolling walker (2 wheels)  ?Recommendations for Other Services ?    ?  ?Functional Status Assessment Patient has had a recent decline in their functional status and demonstrates the ability to make significant improvements in function in a reasonable and predictable amount of time.  ? ?  ?Precautions / Restrictions Precautions ?Precautions: Fall ?Restrictions ?Weight Bearing Restrictions: No  ? ?  ? ?Mobility ? Bed Mobility ?Overal bed mobility: Needs Assistance ?Bed Mobility: Supine to Sit ?  ?  ?Supine to sit: Min guard ?  ?  ?General bed mobility comments: Pt with sporadic movements of the LE's when trying to get to the edge of bed.  Pt noting that it was much better today than it was yesterday. ?  ? ?Transfers ?Overall transfer level: Needs assistance ?Equipment used: Rolling walker (2 wheels) ?Transfers: Sit to/from Stand ?Sit to Stand: Min assist, Mod assist ?  ?  ?  ?  ?  ?General transfer comment: MinA to come upright and once upright, modA applied for stability due to LE's giving way at times/shaking. ?  ? ?Ambulation/Gait ?Ambulation/Gait assistance: Min assist, Mod assist ?Gait Distance (Feet): 160 Feet ?Assistive device: Rolling walker (2 wheels) ?Gait Pattern/deviations: Step-to pattern, Decreased stride length, Knees buckling ?Gait velocity: decreased ?  ?  ?General Gait Details: Pt able to ambulate around the nursing station, but required min-modA from therapist to prevent LOB. ? ?Stairs ?  ?  ?  ?  ?  ? ?Wheelchair Mobility ?  ? ?Modified Rankin (Stroke Patients Only) ?  ? ?  ? ?Balance Overall balance assessment: Needs assistance ?Sitting-balance support: No upper extremity supported, Feet supported ?Sitting balance-Leahy  Scale: Fair ?  ?  ?Standing balance support: Bilateral upper extremity supported, During functional activity ?Standing balance-Leahy Scale: Poor ?Standing balance comment: Pt requires UE  support on RW to keep balance in standing. ?  ?  ?  ?  ?  ?  ?  ?  ?  ?  ?  ?   ? ? ? ?Pertinent Vitals/Pain Pain Assessment ?Pain Assessment: No/denies pain  ? ? ?Home Living Family/patient expects to be discharged to:: Private residence ?Living Arrangements: Alone ?Available Help at Discharge: Family;Friend(s);Available 24 hours/day (Son lives 10 miles away, neighbor available all the time.) ?Type of Home: House ?Home Access: Level entry ?  ?  ?  ?Home Layout: One level ?Home Equipment: Rollator (4 wheels);Shower seat;Grab bars - tub/shower;Hand held shower head ?   ?  ?Prior Function Prior Level of Function : Independent/Modified Independent ?  ?  ?  ?  ?  ?  ?  ?  ?  ? ? ?Hand Dominance  ? Dominant Hand: Right ? ?  ?Extremity/Trunk Assessment  ? Upper Extremity Assessment ?Upper Extremity Assessment: Generalized weakness ?  ? ?Lower Extremity Assessment ?Lower Extremity Assessment: Generalized weakness ?  ? ?Cervical / Trunk Assessment ?Cervical / Trunk Assessment: Kyphotic  ?Communication  ? Communication: No difficulties  ?Cognition Arousal/Alertness: Awake/alert ?Behavior During Therapy: [employer] for tasks assessed/performed ?Overall Cognitive Status: Within Functional Limits for tasks assessed ?  ?  ?  ?  ?  ?  ?  ?  ?  ?  ?  ?  ?  ?  ?  ?  ?  ?  ?  ? ?  ?General Comments   ? ?  ?Exercises Total Joint Exercises ?Ankle Circles/Pumps: AROM, Strengthening, Both, 10 reps, Supine ?Quad Sets: AROM, Strengthening, Both, 10 reps, Supine ?Gluteal Sets: AROM, Strengthening, Both, 10 reps, Supine ?Heel Slides: AROM, Strengthening, Both, 10 reps, Supine ?Hip ABduction/ADduction: AROM, Strengthening, Both, 10 reps, Supine ?Straight Leg Raises: AROM, Strengthening, Both, 10 reps, Supine ?Marching in Standing: AROM, Strengthening, Both, 10 reps, Standing  ? ?Assessment/Plan  ?  ?PT Assessment Patient needs continued PT services  ?PT Problem List Decreased strength;Decreased range of motion;Decreased activity tolerance;Decreased  balance;Decreased mobility;Decreased coordination;Decreased knowledge of use of DME;Decreased safety awareness ? ?   ?  ?PT Treatment Interventions DME instruction;Gait training;Functional mobility training;Therapeutic activities;Therapeutic exercise;Balance training;Neuromuscular re-education   ? ?PT Goals (Current goals can be found in the Care Plan section)  ?Acute Rehab PT Goals ?Patient Stated Goal: to get stronger and go home. ?PT Goal Formulation: With patient ?Time For Goal Achievement: 02/05/22 ?Potential to Achieve Goals: Fair ? ?  ?Frequency Min 2X/week ?  ? ? ?Co-evaluation   ?  ?  ?  ?  ? ? ?  ?AM-PAC PT "6 Clicks" Mobility  ?Outcome Measure Help needed turning from your back to your side while in a flat bed without using bedrails?: A Little ?Help needed moving from lying on your back to sitting on the side of a flat bed without using bedrails?: A Little ?Help needed moving to and from a bed to a chair (including a wheelchair)?: A Lot ?Help needed standing up from a chair using your arms (e.g., wheelchair or bedside chair)?: A Little ?Help needed to walk in hospital room?: A Lot ?Help needed climbing 3-5 steps with a railing? : A Lot ?6 Click Score: 15 ? ?  ?End of Session Equipment Utilized During Treatment: Gait belt ?Activity Tolerance: Patient tolerated treatment well ?Patient left: in chair;with  call bell/phone within reach;with chair alarm set ?Nurse Communication: Mobility status ?PT Visit Diagnosis: Unsteadiness on feet (R26.81);Other abnormalities of gait and mobility (R26.89);Repeated falls (R29.6);Muscle weakness (generalized) (M62.81);History of falling (Z91.81);Difficulty in walking, not elsewhere classified (R26.2) ?  ? ?Time: 7793-9030 ?PT Time Calculation (min) (ACUTE ONLY): 41 min ? ? ?Charges:   PT Evaluation ?$PT Eval Low Complexity: 1 Low ?PT Treatments ?$Gait Training: 8-22 mins ?$Therapeutic Exercise: 8-22 mins ?  ?   ? ? ?Gwenlyn Saran, PT, DPT ?01/22/22, 1:46 PM ? ? ?Christie Nottingham ?01/22/2022, 1:31 PM ? ?

## 2022-01-22 NOTE — Consult Note (Signed)
Patient ID: Anita Greene, female   DOB: 15-Mar-1951, 71 y.o.   MRN: [id number] ? ?[facility] ?Anita [facility] is a 71 y.o. female seen in consultation at the request of Dr. Candiss Norse for placement of PD catheter.  He was admitted yesterday secondary to failure to thrive weakness and worsening renal function.  She does have a history of back pain diabetes CKD, CVA recurrent UTIs.  He did have a history of redo hiatal hernia with gastric bypass 3 years ago at [facility] by Dr. Margart Sickles.  Please note that I personally reviewed operative records from [facility].  She is doing well from that perspective and has lost her desire weight.  She also came in with severe anemia that is symptomatic. ?Labs reveal hemoglobin of 6.3 with normal platelets normal white count, creatinine up to 9.4 with a BUN of 99.  Nephrology is planning on doing hemodialysis tomorrow.  She is currently not toxic and does not endorse any specific complaints. ?She does have tremors. ?She did have a recent chest x-ray that I have personally reviewed showing no evidence of disease.  I was consulted regarding placement of PD catheter and its feasibility. ?Prior cholecystectomy distant past. ? ?[facility] ? ?Past Medical History:  ?Diagnosis Date  ? Allergic rhinitis due to pollen   ? Arthritis   ? B12 deficiency   ? Chronic kidney disease   ? Fibromyalgia   ? GERD (gastroesophageal reflux disease)   ? Hemiparesis affecting right side as late effect of cerebrovascular accident (CVA) ([facility])   ? Hypertension   ? IBS (irritable bowel syndrome)   ? diarrhea prone  ? Iron deficiency anemia   ? Migraine headache   ? Mood disorder ([place])   ? Obstructive sleep apnea   ? CPAP 11 (auto titrate)  ? Optic atrophy of right eye   ? PFO (patent foramen ovale)   ? RLS (restless legs syndrome)   ? Sleep disturbance   ? Stroke [facility])   ? Type 2 diabetes mellitus with neurological manifestations, controlled ([facility]ita [facility])   ? ? ?Past Surgical History:  ?Procedure Laterality Date  ? CATARACT EXTRACTION W/ INTRAOCULAR  LENS  IMPLANT, BILATERAL    ? CHOLECYSTECTOMY    ? COLONOSCOPY    ? COLONOSCOPY WITH PROPOFOL N/A 04/04/2018  ? Procedure: COLONOSCOPY WITH PROPOFOL;  Surgeon: Lollie Sails, MD;  Location: [facility];  Service: Endoscopy;  Laterality: N/A;  ? ESOPHAGOGASTRODUODENOSCOPY (EGD) WITH PROPOFOL N/A 04/04/2018  ? Procedure: ESOPHAGOGASTRODUODENOSCOPY (EGD) WITH PROPOFOL;  Surgeon: Lollie Sails, MD;  Location: [facility];  Service: Endoscopy;  Laterality: N/A;  ? HERNIA REPAIR    ? NISSEN FUNDOPLICATION    ? TONSILLECTOMY    ? TUBAL LIGATION    ? UPPER GASTROINTESTINAL ENDOSCOPY    ? Uroplasty  ~2000's  ? Dr Bernardo Heater  ? ? ?Family History  ?Problem Relation Age of Onset  ? Cancer Mother   ?     lung cancer  ? Parkinson's disease Father   ? Dementia Father   ? COPD Father   ? Heart disease Father   ? Cancer Sister   ?     lung cancer  ? Cancer Son   ?     Ewing's sarcoma  ? Breast cancer Neg Hx   ? ? ?Social History ?Social History  ? ?Tobacco Use  ? Smoking status: Never  ? Smokeless tobacco: Never  ?Vaping Use  ? Vaping Use: Never used  ?Substance Use Topics  ? Alcohol use: Never  ?  Alcohol/week: 0.0 standard drinks  ? ? ?Allergies  ?Allergen Reactions  ? Cefditoren Pivoxil   ?  Other reaction(s): Other (See Comments) ?GI issues, severe abdominal pain  ? Clarithromycin   ?  Other reaction(s): Other (See Comments) ?GI issues  ? Codeine Sulfate Other (See Comments)  ? Erythromycin Other (See Comments)  ? Other Other (See Comments)  ?  Oral iron  ? Pseudoephedrine   ?  ams  ? Sulfa Antibiotics Itching  ? ? ?Current Facility-Administered Medications  ?Medication Dose Route Frequency Provider Last Rate Last Admin  ? 0.9 %  sodium chloride infusion (Manually program via Guardrails IV Fluids)   Intravenous Once Dwyane Dee, MD      ? acetaminophen (TYLENOL) tablet 650 mg  650 mg Oral Q6H PRN Clance Boll, MD      ? Or  ? acetaminophen (TYLENOL) suppository 650 mg  650 mg Rectal Q6H PRN Clance Boll, MD      ? albuterol (PROVENTIL) (2.5 MG/3ML) 0.083% nebulizer solution 2.5 mg  2.5 mg Nebulization Q2H PRN Clance Boll, MD      ? aspirin EC tablet 81 mg  81 mg Oral QHS Clance Boll, MD   81 mg at 01/21/22 2206  ? buPROPion (WELLBUTRIN XL) 24 hr tablet 300 mg  300 mg Oral Daily Myles Rosenthal A, MD   300 mg at 01/22/22 0900  ? busPIRone (BUSPAR) tablet 15 mg  15 mg Oral TID Myles Rosenthal A, MD   15 mg at 01/22/22 0900  ? calcium acetate (PHOSLO) capsule 1,334 mg  1,334 mg Oral TID WC Clance Boll, MD   1,334 mg at 01/22/22 1220  ? Darbepoetin Alfa (ARANESP) injection 100 mcg  100 mcg Subcutaneous Q Thu-1800 Singh, Harmeet, MD      ? DULoxetine (CYMBALTA) DR capsule 30 mg  30 mg Oral Daily Myles Rosenthal A, MD   30 mg at 01/22/22 0900  ? feeding supplement (ENSURE ENLIVE / ENSURE PLUS) liquid 237 mL  237 mL Oral BID BM Dwyane Dee, MD      ? ferrous sulfate tablet 325 mg  325 mg Oral BID WC Myles Rosenthal A, MD   325 mg at 01/22/22 0900  ? midodrine (PROAMATINE) tablet 10 mg  10 mg Oral TID PRN Dwyane Dee, MD      ? sodium chloride flush (NS) 0.9 % injection 3 mL  3 mL Intravenous Q12H Myles Rosenthal A, MD   3 mL at 01/21/22 2150  ? ? ? ?Review of Systems ?Full ROS  was asked and was negative except for the information on the [facility] ? ?Physical Exam ?Blood pressure 128/71, pulse 85, temperature 98.3 ?F (36.8 ?C), resp. rate 17, height '5\' 1"'$  (1.549 m), weight 62.7 kg, SpO2 98 %. ?CONSTITUTIONAL: NAd chronically ill and pale. ?EYES: Pupils are equal, round, , Sclera are non-icteric. ?EARS, NOSE, MOUTH AND THROAT: . Hearing is intact to voice. ?LYMPH NODES:  Lymph nodes in the neck are normal. ?RESPIRATORY:  Lungs are clear. There is normal respiratory effort, with equal breath sounds bilaterally, and without pathologic use of accessory muscles. ?CARDIOVASCULAR: Heart is regular without murmurs, gallops, or rubs. ?GI: The abdomen is  soft, nontender, and nondistended. There  are no palpable masses. There is no hepatosplenomegaly. There are normal bowel sounds in all quadrants. Multiple prior lap scar  ?GU: Rectal deferred.   ?MUSCULOSKELETAL: Normal muscle strength and tone. No cyanosis or edema.   ?SKIN: Turgor is good and  there are no pathologic skin lesions or ulcers. ?NEUROLOGIC: Motor and sensation is grossly normal. Cranial nerves are grossly intact. ?PSYCH:  Oriented to person, place and time. Affect is normal. ? ?Data Reviewed ? ?I have personally reviewed the patient's imaging, laboratory findings and medical records.   ? ?Assessment/Plan ?71 year old female with multiple surgical operations now with worsening kidney disease in need for dialysis.  She will start hemodialysis tomorrow.  I have an extensive discussion with the patient regarding different modes of dialysis and she is interested in pursuing peritoneal dialysis.  She says that at least she wants to give the shot.  Given multiple prior abdominal operations I do think it is prudent to obtain a CT scan with p.o. contrast to evaluate intra-abdominal anatomy.  Tentatively once her hemoglobin improves and she will be better optimized after hemodialysis I do think that we can perform PD catheter while she is in-house.  She will go to rehab and this will give that a bit more time to get her started on peritoneal dialysis.  Please note I spent greater than 75 minutes in this encounter including coordination of her care, placing orders, personally reviewing imaging studies and performing appropriate documentation. ? ? ?Caroleen Hamman, MD FACS ?General Surgeon ?01/22/2022, 3:33 PM ? ?  ?

## 2022-01-22 NOTE — Assessment & Plan Note (Signed)
Continue PPI ?

## 2022-01-22 NOTE — Assessment & Plan Note (Signed)
-   continue asa ?

## 2022-01-22 NOTE — TOC Progression Note (Signed)
Transition of Care (TOC) - Progression Note  ? ? ?Patient Details  ?Name: Anita Greene ?MRN: [id number] ?Date of Birth: 1950-11-29 ? ?Transition of Care (TOC) CM/SW Contact  ?Conception Oms, RN ?Phone Number: ?01/22/2022, 3:21 PM ? ?Clinical Narrative:   PT evaluated the patient and recommends STR SNF, She stated that she would consider it and talk to her famnily, She has a little dog at home that she is concerned about, She is agreeable to a bedsearch and will let the [facility] person know tomorrow ? ? ? ?  ?  ? ?Expected Discharge Plan and Services ?  ?  ?  ?  ?  ?                ?  ?  ?  ?  ?  ?  ?  ?  ?  ?  ? ? ?Social Determinants of Health (SDOH) Interventions ?  ? ?Readmission Risk Interventions ?   ? View : No data to display.  ?  ?  ?  ? ? ?

## 2022-01-22 NOTE — Assessment & Plan Note (Signed)
-   Continue Wellbutrin, BuSpar, Cymbalta ?

## 2022-01-22 NOTE — Assessment & Plan Note (Addendum)
-   Likely mixed etiology with history of iron deficiency anemia and worsening renal function ?- Hgb 6.3 g/dL on 3/30, s/p 1 unit PRBC ?- Iron studies on admission considered adequate ?-Continue trending CBC ?

## 2022-01-22 NOTE — Progress Notes (Signed)
Initial Nutrition Assessment ? ?DOCUMENTATION CODES:  ?Not applicable ? ?INTERVENTION:  ?Continue current diet as ordered, encourage PO intake. ?Ensure Enlive po BID, each supplement provides 350 kcal and 20 grams of protein. ? ?NUTRITION DIAGNOSIS:  ?Increased nutrient needs related to acute illness as evidenced by estimated needs. ? ?GOAL:  ?Patient will meet greater than or equal to 90% of their needs ? ?MONITOR:  ?PO intake, Supplement acceptance, Weight trends, I & O's ? ?REASON FOR ASSESSMENT:  ?Malnutrition Screening Tool ?  ? ?ASSESSMENT:  ?71 y.o. female with PMH of type 2 DM, GERD (s/p Nissen fundoplication), HTN, hx CVA, IBS, vitamin b12 deficiency, depression/anxiety, and CKD3 presented to ED at the advice of PCP due to progressive fatigue/malaise and lower extremity weakness with tremors. Pt reports feeling unwell for 2-3 weeks since being treated for UTI. ? ?Pt found to have acute renal failure superimposed on CKD on admission with altered electrolytes. ? ?Attempted to call pt on room phone, no answer at this time.  ? ?No intake is recorded this admission, but in RN admission screen pt denied poor intake.  ? ?6.6% weight loss noted x 5 months which is not severe for this time frame. Noted pt has fluid present the th BLE, could be falsely elevating weight. ? ?Noted that Nephrology was consulted and stated that "patient could have dialysis arranged as an outpatient but if she is feeling too unwell to go home he will arrange for PermCath placement and dialysis sooner in the hospital." ? ?Nutritionally Relevant Medications: ?Scheduled Meds: ? calcium acetate  1,334 mg Oral TID WC  ? ferrous sulfate  325 mg Oral BID WC  ? ?Continuous Infusions: ?  sodium bicarbonate infusion in sterile water 50 mL/hr at 01/21/22 2211  ? ?Labs Reviewed: ?BUN 99, creatinine 9.46 ?Phosphorus 9.0 ?Vitamin B12 >75000 ?CBG ranges from 131-133 mg/dL over the last 24 hours ?HgbA1c 5.3% (3/29) ? ?NUTRITION - FOCUSED PHYSICAL  EXAM: ?Defer to in-person assessment ? ?Diet Order:   ?Diet Order   ? ?       ?  Diet Carb Modified Fluid consistency: Thin; Room service appropriate? Yes  Diet effective now       ?  ? ?  ?  ? ?  ? ? ?EDUCATION NEEDS:  ?No education needs have been identified at this time ? ?Skin:  Skin Assessment: Reviewed RN Assessment ? ?Last BM:  3/29 ? ?Height:  ?Ht Readings from Last 1 Encounters:  ?01/21/22 '5\' 1"'$  (1.549 m)  ? ?Weight:  ?Wt Readings from Last 1 Encounters:  ?01/22/22 62.7 kg  ? ?Ideal Body Weight:  47.7 kg ? ?BMI:  Body mass index is 26.12 kg/m?. ? ?Estimated Nutritional Needs:  ?Kcal:  1500-1700 kcal/d ?Protein:  75-90g/d ?Fluid:  1.7-2 L/d ? ? ?Ranell Patrick, RD, LDN ?Clinical Dietitian ?RD pager # available in [place]  ?After hours/weekend pager # available in [place] ?

## 2022-01-22 NOTE — Assessment & Plan Note (Signed)
-   Replete as needed 

## 2022-01-22 NOTE — Plan of Care (Signed)

## 2022-01-22 NOTE — Progress Notes (Signed)
Pt was admitted with no signs of distress. Pt alert x 4/ VSS Pt was educated about safety and ascom within pt reach. Will continue to monitor. ?

## 2022-01-22 NOTE — Assessment & Plan Note (Addendum)
-   Rapidly progressive renal failure; she is followed closely outpatient by nephrology.  Suspicion is that she has progressed to ESRD ?- Patient presented with weakness/fatigue and UE tremor ?- labs show rapid progression of renal failure; appreciate nephrology evaluation ?- s/p RIJ permcath placed by vascular surgery on 3/31 ?- 1st HD session 3/31 ?- outpatient plan is for traditional HD, she will need an HD chair secured prior to being able to d/c home ?-Undergoing third session of dialysis today, 01/26/2022 ?

## 2022-01-23 ENCOUNTER — Encounter: Payer: Self-pay | Admitting: Internal Medicine

## 2022-01-23 ENCOUNTER — Inpatient Hospital Stay
Admission: EM | Disposition: A | Discharge status: Skilled Nursing Facility | Payer: Self-pay | Source: Ambulatory Visit | Attending: Internal Medicine

## 2022-01-23 DIAGNOSIS — N1832 Chronic kidney disease, stage 3b: Secondary | ICD-10-CM | POA: Diagnosis not present

## 2022-01-23 DIAGNOSIS — N179 Acute kidney failure, unspecified: Secondary | ICD-10-CM | POA: Diagnosis not present

## 2022-01-23 HISTORY — PX: DIALYSIS/PERMA CATHETER INSERTION: CATH118288

## 2022-01-23 LAB — RENAL FUNCTION PANEL
Albumin: 3.2 g/dL — ABNORMAL LOW (ref 3.5–5.0)
Anion gap: 16 — ABNORMAL HIGH (ref 5–15)
BUN: 93 mg/dL — ABNORMAL HIGH (ref 8–23)
CO2: 16 mmol/L — ABNORMAL LOW (ref 22–32)
Calcium: 6.7 mg/dL — ABNORMAL LOW (ref 8.9–10.3)
Chloride: 105 mmol/L (ref 98–111)
Creatinine, Ser: 9.05 mg/dL — ABNORMAL HIGH (ref 0.44–1.00)
GFR, Estimated: 4 mL/min — ABNORMAL LOW (ref >60)
Glucose, Bld: 85 mg/dL (ref 70–99)
Phosphorus: 8.3 mg/dL — ABNORMAL HIGH (ref 2.5–4.6)
Potassium: 3.2 mmol/L — ABNORMAL LOW (ref 3.5–5.1)
Sodium: 137 mmol/L (ref 135–145)

## 2022-01-23 LAB — GLUCOSE, CAPILLARY: Glucose-Capillary: 82 mg/dL (ref 70–99)

## 2022-01-23 LAB — ALDOLASE: Aldolase: 2.1 U/L — ABNORMAL LOW (ref 3.3–10.3)

## 2022-01-23 LAB — CBC WITH DIFFERENTIAL/PLATELET
Abs Immature Granulocytes: 0.09 10*3/uL — ABNORMAL HIGH (ref 0.00–0.07)
Basophils Absolute: 0 10*3/uL (ref 0.0–0.1)
Basophils Relative: 0 %
Eosinophils Absolute: 0.2 10*3/uL (ref 0.0–0.5)
Eosinophils Relative: 2 %
HCT: 23.7 % — ABNORMAL LOW (ref 36.0–46.0)
Hemoglobin: 8.2 g/dL — ABNORMAL LOW (ref 12.0–15.0)
Immature Granulocytes: 1 %
Lymphocytes Relative: 26 %
Lymphs Abs: 2.4 10*3/uL (ref 0.7–4.0)
MCH: 29.9 pg (ref 26.0–34.0)
MCHC: 34.6 g/dL (ref 30.0–36.0)
MCV: 86.5 fL (ref 80.0–100.0)
Monocytes Absolute: 0.6 10*3/uL (ref 0.1–1.0)
Monocytes Relative: 6 %
Neutro Abs: 6 10*3/uL (ref 1.7–7.7)
Neutrophils Relative %: 65 %
Platelets: 265 10*3/uL (ref 150–400)
RBC: 2.74 MIL/uL — ABNORMAL LOW (ref 3.87–5.11)
RDW: 14.6 % (ref 11.5–15.5)
WBC: 9.2 10*3/uL (ref 4.0–10.5)
nRBC: 0 % (ref 0.0–0.2)

## 2022-01-23 LAB — TYPE AND SCREEN
ABO/RH(D): A POS
Antibody Screen: NEGATIVE
Unit division: 0

## 2022-01-23 LAB — BPAM RBC
Blood Product Expiration Date: 202304222359
ISSUE DATE / TIME: 202303301655
Unit Type and Rh: 6200

## 2022-01-23 LAB — MAGNESIUM: Magnesium: 1.6 mg/dL — ABNORMAL LOW (ref 1.7–2.4)

## 2022-01-23 LAB — HEPATITIS B SURFACE ANTIBODY,QUALITATIVE: Hep B S Ab: NONREACTIVE

## 2022-01-23 LAB — ERYTHROPOIETIN: Erythropoietin: 3 m[IU]/mL (ref 2.6–18.5)

## 2022-01-23 LAB — HEPATITIS B CORE ANTIBODY, TOTAL: Hep B Core Total Ab: NONREACTIVE

## 2022-01-23 LAB — HEPATITIS B SURFACE ANTIGEN: Hepatitis B Surface Ag: NONREACTIVE

## 2022-01-23 SURGERY — DIALYSIS/PERMA CATHETER INSERTION
Anesthesia: Moderate Sedation

## 2022-01-23 MED ORDER — ACETAMINOPHEN 325 MG PO TABS
ORAL_TABLET | ORAL | Status: AC
  Filled 2022-01-23: qty 2

## 2022-01-23 MED ORDER — VANCOMYCIN HCL IN DEXTROSE 1-5 GM/200ML-% IV SOLN
1000.0000 mg | INTRAVENOUS | Status: DC
  Administered 2022-01-23: 1000 mg via INTRAVENOUS
  Filled 2022-01-23: qty 200

## 2022-01-23 MED ORDER — MIDAZOLAM HCL 2 MG/2ML IJ SOLN
INTRAMUSCULAR | Status: DC | PRN
  Administered 2022-01-23: 2 mg via INTRAVENOUS

## 2022-01-23 MED ORDER — MAGNESIUM SULFATE 2 GM/50ML IV SOLN: 2.0000 g | Freq: Once | INTRAVENOUS | Status: DC

## 2022-01-23 MED ORDER — MIDAZOLAM HCL 2 MG/2ML IJ SOLN
INTRAMUSCULAR | Status: AC
  Filled 2022-01-23: qty 4

## 2022-01-23 MED ORDER — HEPARIN SODIUM (PORCINE) 1000 UNIT/ML IJ SOLN
INTRAMUSCULAR | Status: AC
  Filled 2022-01-23: qty 10

## 2022-01-23 MED ORDER — FENTANYL CITRATE (PF) 100 MCG/2ML IJ SOLN
INTRAMUSCULAR | Status: DC | PRN
Start: 2022-01-23 — End: 2022-01-23
  Administered 2022-01-23: 50 ug via INTRAVENOUS

## 2022-01-23 MED ORDER — SODIUM CHLORIDE 0.9 % IV SOLN
INTRAVENOUS | Status: DC
  Administered 2022-01-23: 20 mL via INTRAVENOUS

## 2022-01-23 MED ORDER — CALCIUM GLUCONATE-NACL 2-0.675 GM/100ML-% IV SOLN
2.0000 g | Freq: Once | INTRAVENOUS | Status: AC
  Administered 2022-01-23: 2000 mg via INTRAVENOUS
  Filled 2022-01-23: qty 100

## 2022-01-23 MED ORDER — FENTANYL CITRATE PF 50 MCG/ML IJ SOSY
PREFILLED_SYRINGE | INTRAMUSCULAR | Status: AC
  Filled 2022-01-23: qty 2

## 2022-01-23 SURGICAL SUPPLY — 9 items
ADH SKN CLS APL DERMABOND .7 (GAUZE/BANDAGES/DRESSINGS) ×1
BIOPATCH RED 1 DISK 7.0 (GAUZE/BANDAGES/DRESSINGS) ×1 IMPLANT
CATH PALINDROME-P 19CM W/VT (CATHETERS) ×1 IMPLANT
COVER PROBE U/S 5X48 (MISCELLANEOUS) ×1 IMPLANT
DERMABOND ADVANCED (GAUZE/BANDAGES/DRESSINGS) ×1
DERMABOND ADVANCED .7 DNX12 (GAUZE/BANDAGES/DRESSINGS) IMPLANT
PACK ANGIOGRAPHY (CUSTOM PROCEDURE TRAY) ×1 IMPLANT
SUT MNCRL AB 4-0 PS2 18 (SUTURE) ×1 IMPLANT
SUT PROLENE 0 CT 1 30 (SUTURE) ×1 IMPLANT

## 2022-01-23 NOTE — Op Note (Signed)
OPERATIVE NOTE ? ? ? ?PRE-OPERATIVE DIAGNOSIS: 1. ESRD ? ? ?POST-OPERATIVE DIAGNOSIS: same as above ? ?PROCEDURE: ?Ultrasound guidance for vascular access to the right internal jugular vein ?Fluoroscopic guidance for placement of catheter ?Placement of a 19 cm tip to cuff tunneled hemodialysis catheter via the right internal jugular vein ? ?SURGEON: Leotis Pain, MD ? ?ANESTHESIA:  Local with Moderate conscious sedation for approximately 18 minutes using 2 mg of Versed and 50 mcg of Fentanyl ? ?ESTIMATED BLOOD LOSS: 5 cc ? ?FLUORO TIME: less than one minute ? ?CONTRAST: none ? ?FINDING(S): ?1.  Patent right internal jugular vein ? ?SPECIMEN(S):  None ? ?INDICATIONS:   ?[facility] is a 71 y.o.female who presents with renal failure.  The patient needs long term dialysis access for their ESRD, and a Permcath is necessary.  Risks and benefits are discussed and informed consent is obtained.   ? ?DESCRIPTION: ?After obtaining full informed written consent, the patient was brought back to the vascular suited. The patient's right neck and chest were sterilely prepped and draped in a sterile surgical field was created. Moderate conscious sedation was administered during a face to face encounter with the patient throughout the procedure with my supervision of the RN administering medicines and monitoring the patient's vital signs, pulse oximetry, telemetry and mental status throughout from the start of the procedure until the patient was taken to the recovery room.  The right internal jugular vein was visualized with ultrasound and found to be patent. It was then accessed under direct ultrasound guidance and a permanent image was recorded. A wire was placed. After skin nick and dilatation, the peel-away sheath was placed over the wire. ?I then turned my attention to an area under the clavicle. Approximately 1-2 fingerbreadths below the clavicle a small counterincision was created and tunneled from the subclavicular incision  to the access site. Using fluoroscopic guidance, a 19 centimeter tip to cuff tunneled hemodialysis catheter was selected, and tunneled from the subclavicular incision to the access site. It was then placed through the peel-away sheath and the peel-away sheath was removed. Using fluoroscopic guidance the catheter tips were parked in the right atrium. The appropriate distal connectors were placed. It withdrew blood well and flushed easily with heparinized saline and a concentrated heparin solution was then placed. It was secured to the chest wall with 2 Prolene sutures. The access incision was closed single 4-0 Monocryl. A 4-0 Monocryl pursestring suture was placed around the exit site. Sterile dressings were placed. ?The patient tolerated the procedure well and was taken to the recovery room in stable condition. ? ?COMPLICATIONS: None ? ?CONDITION: Stable ? ?Leotis Pain, MD ?01/23/2022 ?10:23 AM ? ? ?This note was created with Dragon Medical transcription system. Any errors in dictation are purely unintentional.  ?

## 2022-01-23 NOTE — Progress Notes (Signed)
[facility] ?[place], [place] ?01/23/22 ? ?Subjective:  ? ?Hospital day # 2 ? ?Patient known to our practice from outpatient follow-up. ?She presents to the emergency room for not feeling well for the past 2 to 3 weeks.  She had lower extremity weakness and states that she was not able to walk even to 3 steps. ? ?Patient seen in recovery after Permcath placement ?Denies pain and discomfort ?Currently NPO ? ? ? ?Objective:  ?Vital signs in last 24 hours:  ?Temp:  [97.3 ?F (36.3 ?C)-98.6 ?F (37 ?C)] 97.6 ?F (36.4 ?C) (03/31 1116) ?Pulse Rate:  [78-101] 101 (03/31 1315) ?Resp:  [11-28] 28 (03/31 1315) ?BP: (84-163)/(64-119) 137/119 (03/31 1315) ?SpO2:  [84 %-100 %] 100 % (03/31 1315) ?Weight:  [59.4 kg-63.8 kg] 59.4 kg (03/31 1116) ? ?Weight change: 4.3 kg ?Filed Weights  ? 01/23/22 0500 01/23/22 3086 01/23/22 1116  ?Weight: 63.8 kg 59.4 kg 59.4 kg  ? ? ?Intake/Output: ?  ? ?Intake/Output Summary (Last 24 hours) at 01/23/2022 1326 ?Last data filed at 01/23/2022 0358 ?Gross per 24 hour  ?Intake 842 ml  ?Output 301 ml  ?Net 541 ml  ? ? ? ?Physical Exam: ?General:  No acute distress, laying in the bed  ?HEENT  anicteric, moist oral mucous membrane  ?Pulm/lungs  normal breathing effort, lungs are clear to auscultation  ?CVS/Heart  regular rhythm, no rub or gallop  ?Abdomen:   Soft, nontender  ?Extremities:  No peripheral edema  ?Neurologic:  Alert, oriented, able to follow commands  ?Skin:  No acute rashes  ?Access: Rt Permcath ? ?Basic Metabolic Panel: ? ?Recent Labs  ?Lab 01/21/22 ?1036 01/21/22 ?1214 01/22/22 ?5784 01/23/22 ?6962  ?NA 134*  --  137 137  ?K 3.7  --  3.8 3.2*  ?CL 106  --  105 105  ?CO2 11*  --  18* 16*  ?GLUCOSE 133*  --  131* 85  ?BUN 99*  --  99* 93*  ?CREATININE 10.53*  --  9.46* 9.05*  ?CALCIUM 7.3*  --  7.2* 6.7*  ?MG  --  1.4* 1.8 1.6*  ?PHOS  --  9.6* 9.0* 8.3*  ? ? ? ? ?CBC: ?Recent Labs  ?Lab 01/21/22 ?1036 01/22/22 ?9528 01/23/22 ?4132  ?WBC 11.4* 9.0 9.2  ?NEUTROABS  --  6.2  6.0  ?HGB 7.9* 6.3* 8.2*  ?HCT 24.4* 19.0* 23.7*  ?MCV 92.8 89.6 86.5  ?PLT 339 303 265  ? ? ? ?  ?Lab Results  ?Component Value Date  ? HEPBSAG NON REACTIVE 01/23/2022  ? HEPBSAB NON REACTIVE 01/23/2022  ? ? ? ? ?Microbiology: ? ?Recent Results (from the past 240 hour(s))  ?Resp Panel by RT-PCR (Flu A&B, Covid) Nasopharyngeal Swab     Status: None  ? Collection Time: 01/21/22  3:47 PM  ? Specimen: Nasopharyngeal Swab; Nasopharyngeal(NP) swabs in vial transport medium  ?Result Value Ref Range Status  ? SARS Coronavirus 2 by RT PCR NEGATIVE NEGATIVE Final  ?  Comment: (NOTE) ?SARS-CoV-2 target nucleic acids are NOT DETECTED. ? ?The SARS-CoV-2 RNA is generally detectable in upper respiratory ?specimens during the acute phase of infection. The lowest ?concentration of SARS-CoV-2 viral copies this assay can detect is ?138 copies/mL. A negative result does not preclude SARS-Cov-2 ?infection and should not be used as the sole basis for treatment or ?other patient management decisions. A negative result may occur with  ?improper specimen collection/handling, submission of specimen other ?than nasopharyngeal swab, presence of viral mutation(s) within the ?areas targeted by  this assay, and inadequate number of viral ?copies(<138 copies/mL). A negative result must be combined with ?clinical observations, patient history, and epidemiological ?information. The expected result is Negative. ? ?Fact Sheet for Patients:  ?EntrepreneurPulse.com.au ? ?Fact Sheet for Healthcare Providers:  ?IncredibleEmployment.be ? ?This test is no t yet approved or cleared by the [place] FDA and  ?has been authorized for detection and/or diagnosis of SARS-CoV-2 by ?FDA under an Emergency Use Authorization (EUA). This EUA will remain  ?in effect (meaning this test can be used) for the duration of the ?COVID-19 declaration under Section 564(b)(1) of the Act, 21 ?U.S.C.section 360bbb-3(b)(1), unless the  authorization is terminated  ?or revoked sooner.  ? ? ?  ? Influenza A by PCR NEGATIVE NEGATIVE Final  ? Influenza B by PCR NEGATIVE NEGATIVE Final  ?  Comment: (NOTE) ?The Xpert Xpress SARS-CoV-2/FLU/RSV plus assay is intended as an aid ?in the diagnosis of influenza from Nasopharyngeal swab specimens and ?should not be used as a sole basis for treatment. Nasal washings and ?aspirates are unacceptable for Xpert Xpress SARS-CoV-2/FLU/RSV ?testing. ? ?Fact Sheet for Patients: ?EntrepreneurPulse.com.au ? ?Fact Sheet for Healthcare Providers: ?IncredibleEmployment.be ? ?This test is not yet approved or cleared by the [place] FDA and ?has been authorized for detection and/or diagnosis of SARS-CoV-2 by ?FDA under an Emergency Use Authorization (EUA). This EUA will remain ?in effect (meaning this test can be used) for the duration of the ?COVID-19 declaration under Section 564(b)(1) of the Act, 21 U.S.C. ?section 360bbb-3(b)(1), unless the authorization is terminated or ?revoked. ? ?Performed at [facility], [place], ?[place] [address] ?  ? ? ?Coagulation Studies: ?No results for input(s): LABPROT, INR in the last 72 hours. ? ?Urinalysis: ?Recent Labs  ?  01/21/22 ?1900  ?COLORURINE YELLOW*  ?LABSPEC 1.009  ?PHURINE 5.0  ?GLUCOSEU NEGATIVE  ?HGBUR SMALL*  ?BILIRUBINUR NEGATIVE  ?KETONESUR NEGATIVE  ?PROTEINUR NEGATIVE  ?NITRITE NEGATIVE  ?LEUKOCYTESUR LARGE*  ? ?  ? ? ?Imaging: ?CT ABDOMEN PELVIS WO CONTRAST ? ?Result Date: 01/22/2022 ?CLINICAL DATA:  History of gastric bypass surgery with nausea and vomiting. EXAM: CT ABDOMEN AND PELVIS WITHOUT CONTRAST TECHNIQUE: Multidetector CT imaging of the abdomen and pelvis was performed following the standard protocol without IV contrast. RADIATION DOSE REDUCTION: This exam was performed according to the departmental dose-optimization program which includes automated exposure control, adjustment of the mA and/or  kV according to patient size and/or use of iterative reconstruction technique. COMPARISON:  July 14, 2011 FINDINGS: Lower chest: No acute abnormality. Hepatobiliary: No focal liver abnormality is seen. Status post cholecystectomy. The common bile duct measures approximately 9.3 mm in diameter. Pancreas: Unremarkable. No pancreatic ductal dilatation or surrounding inflammatory changes. Spleen: Normal in size without focal abnormality. Adrenals/Urinary Tract: A stable 3.7 cm x 3.3 cm low-attenuation (approximately 1.35 Hounsfield units) left adrenal mass is seen. The right adrenal gland is normal in appearance. Kidneys are normal in size, without obstructing renal calculi, focal lesion, or hydronephrosis. Bilateral 2 mm and 3 mm nonobstructing renal calculi are seen. Stable,, likely benign approximately 1.9 cm x 1.5 cm, 1.4 cm x 0.6 cm and 1.1 cm x 1.0 cm hyperdense foci are seen along the base of an otherwise normal appearing urinary bladder. Stomach/Bowel: There is a small to moderate sized hiatal hernia. Surgical sutures are seen within the gastric region. Surgically anastomosed bowel is also noted within the anterior aspect of the mid left abdomen. Appendix appears normal. No evidence of bowel wall thickening, distention, or  inflammatory changes. Vascular/Lymphatic: Mild aortic atherosclerosis. No enlarged abdominal or pelvic lymph nodes. Reproductive: Uterus and bilateral adnexa are unremarkable. Other: No abdominal wall hernia or abnormality. No abdominopelvic ascites. Musculoskeletal: No acute or significant osseous findings. IMPRESSION: 1. Bilateral 2 mm and 3 mm nonobstructing renal calculi. 2. Stable left adrenal adenoma. 3. Small to moderate sized hiatal hernia. 4. Evidence of prior gastric bypass surgery. 5. Mild aortic atherosclerosis. Aortic Atherosclerosis (ICD10-I70.0). Electronically Signed   By: Virgina Norfolk M.D.   On: 01/22/2022 22:39  ? ?DG Chest 2 View ? ?Result Date: 01/21/2022 ?CLINICAL  DATA:  Shortness of breath EXAM: CHEST - 2 VIEW COMPARISON:  04/12/2018 FINDINGS: No focal consolidation. No pleural effusion or pneumothorax. Heart and mediastinal contours are unremarkable. Thoracic aortic a

## 2022-01-23 NOTE — Interval H&P Note (Signed)
History and Physical Interval Note: ? ?01/23/2022 ?9:48 AM ? ?Anita Greene  has presented today for surgery, with the diagnosis of esrd.  The various methods of treatment have been discussed with the patient and family. After consideration of risks, benefits and other options for treatment, the patient has consented to  Procedure(s): ?DIALYSIS/PERMA CATHETER INSERTION (N/A) as a surgical intervention.  The patient's history has been reviewed, patient examined, no change in status, stable for surgery.  I have reviewed the patient's chart and labs.  Questions were answered to the patient's satisfaction.   ? ? ?Leotis Pain ? ? ?

## 2022-01-23 NOTE — Progress Notes (Signed)
?Progress Note ? ? ? ?Anita Greene   ?GMW:[id number]  ?DOB: [birth year]  ?DOA: 01/21/2022     2 ?PCP: Anita Pink, MD ? ?Initial CC: weakness, shaking ? ?Hospital Course: ?Anita Greene is a 71 yo female with PMH CKD3, GERD, B12 deficiency, HTN, IDA, OSA, RLS, CVA, DMII who presented with progressively worsening fatigue/malaise, lower extremity weakness, and hand tremors.  She is followed closely outpatient by nephrology and appears to have had rapidly progressive worsening renal function.  Per nephrology, renal biopsy October 2022 showed arterionephrosclerosis, severe global glomerulosclerosis, interstitial fibrosis. ?She was admitted to have dialysis initiated inpatient.  She underwent PermCath placement followed by initial dialysis session on 01/23/2022. ? ?Interval History:  ?Underwent PermCath placement today followed by dialysis session.  She tolerated both well.  Has some discomfort in her neck from cath placement but otherwise doing okay.  She also endorses improvement in her weakness some today compared to yesterday. ? ?Assessment and Plan: ?* Acute renal failure superimposed on stage 3b chronic kidney disease (Anita Greene) ?- Rapidly progressive renal failure; she is followed closely outpatient by nephrology.  Suspicion is that she has progressed to ESRD ?- Patient presented with weakness/fatigue and UE tremor ?- labs show rapid progression of renal failure; appreciate nephrology evaluation ?- s/p RIJ permcath placed by vascular surgery on 3/31 ?- 1st HD session 3/31, next planned for Saturday ?- outpatient plan is for traditional HD, she will need an HD chair secured prior to being able to d/c home ? ?Anemia of chronic disease ?- Likely mixed etiology with history of iron deficiency anemia and worsening renal function ?- Hgb 6.3 g/dL on 3/30, s/p 1 unit PRBC ?- Iron studies on admission considered adequate ?-Continue trending CBC ? ?Weakness ?- suspected due to renal failure ?- CT C/L spine on admission: mild/mod  DDD in C spine. No acute findings in L spine. CTH showed patchy areas of chronic small vessel disease  ?- monitor response after HD started: Does note improvement in her weakness ?- Continue PT while hospitalized; currently SNF recommended but hopeful that as renal function improves with dialysis, as well her weakness and being able to go home with possibly home health ? ?HTN (hypertension) ?- continue midodrine PRN ?-Verapamil and lisinopril/hctz on hold ? ?Hypomagnesemia ?- Replete as needed ? ?History of CVA (cerebrovascular accident) ?- continue asa ? ?GERD (gastroesophageal reflux disease) ?- Continue PPI ? ?Obstructive sleep apnea ?- Continue nightly CPAP ? ?Mood disorder (Anita Greene) ?- Continue Wellbutrin, BuSpar, Cymbalta ? ?Type 2 diabetes mellitus with neurological manifestations, controlled (Anita Greene) ?- continue SSI and CBG monitoring  ? ? ?Old records reviewed in assessment of this patient ? ?Antimicrobials: ? ? ?DVT prophylaxis:  ?SCDs Start: 01/21/22 1612 ? ? ?Code Status:   Code Status: Full Code ? ?Disposition Plan:  Home in 3-4 days ?Status is: Inpt ? ?Objective: ?Blood pressure (!) 161/66, pulse 91, temperature 98.5 ?F (36.9 ?C), resp. rate 18, height '5\' 1"'$  (1.549 m), weight 60.3 kg, SpO2 99 %.  ?Examination:  ?Physical Exam ?Constitutional:   ?   Appearance: Normal appearance.  ?HENT:  ?   Head: Normocephalic and atraumatic.  ?   Mouth/Throat:  ?   Mouth: Mucous membranes are moist.  ?Eyes:  ?   Extraocular Movements: Extraocular movements intact.  ?Cardiovascular:  ?   Rate and Rhythm: Normal rate and regular rhythm.  ?Pulmonary:  ?   Effort: Pulmonary effort is normal.  ?   Breath sounds: Normal breath sounds.  ?Abdominal:  ?  General: Bowel sounds are normal. There is no distension.  ?   Palpations: Abdomen is soft.  ?   Tenderness: There is no abdominal tenderness.  ?Musculoskeletal:     ?   General: Normal range of motion.  ?   Cervical back: Normal range of motion and neck supple.  ?Skin: ?    General: Skin is warm and dry.  ?Neurological:  ?   Mental Status: She is alert.  ?   Comments: No further tremor appreciated   ?Psychiatric:     ?   Mood and Affect: Mood normal.     ?   Behavior: Behavior normal.  ?  ? ?Consultants:  ?Nephrology ?Vascular surgery ? ?Procedures:  ? ? ?Data Reviewed: ?Results for orders placed or performed during the hospital encounter of 01/21/22 (from the past 24 hour(s))  ?Hepatitis B surface antigen     Status: None  ? Collection Time: 01/23/22  6:28 AM  ?Result Value Ref Range  ? Hepatitis B Surface Ag NON REACTIVE NON REACTIVE  ?CBC with Differential/Platelet     Status: Abnormal  ? Collection Time: 01/23/22  6:28 AM  ?Result Value Ref Range  ? WBC 9.2 4.0 - 10.5 K/uL  ? RBC 2.74 (L) 3.87 - 5.11 MIL/uL  ? Hemoglobin 8.2 (L) 12.0 - 15.0 g/dL  ? HCT 23.7 (L) 36.0 - 46.0 %  ? MCV 86.5 80.0 - 100.0 fL  ? MCH 29.9 26.0 - 34.0 pg  ? MCHC 34.6 30.0 - 36.0 g/dL  ? RDW 14.6 11.5 - 15.5 %  ? Platelets 265 150 - 400 K/uL  ? nRBC 0.0 0.0 - 0.2 %  ? Neutrophils Relative % 65 %  ? Neutro Abs 6.0 1.7 - 7.7 K/uL  ? Lymphocytes Relative 26 %  ? Lymphs Abs 2.4 0.7 - 4.0 K/uL  ? Monocytes Relative 6 %  ? Monocytes Absolute 0.6 0.1 - 1.0 K/uL  ? Eosinophils Relative 2 %  ? Eosinophils Absolute 0.2 0.0 - 0.5 K/uL  ? Basophils Relative 0 %  ? Basophils Absolute 0.0 0.0 - 0.1 K/uL  ? Immature Granulocytes 1 %  ? Abs Immature Granulocytes 0.09 (H) 0.00 - 0.07 K/uL  ?Magnesium     Status: Abnormal  ? Collection Time: 01/23/22  6:28 AM  ?Result Value Ref Range  ? Magnesium 1.6 (L) 1.7 - 2.4 mg/dL  ?Renal function panel     Status: Abnormal  ? Collection Time: 01/23/22  6:28 AM  ?Result Value Ref Range  ? Sodium 137 135 - 145 mmol/L  ? Potassium 3.2 (L) 3.5 - 5.1 mmol/L  ? Chloride 105 98 - 111 mmol/L  ? CO2 16 (L) 22 - 32 mmol/L  ? Glucose, Bld 85 70 - 99 mg/dL  ? BUN 93 (H) 8 - 23 mg/dL  ? Creatinine, Ser 9.05 (H) 0.44 - 1.00 mg/dL  ? Calcium 6.7 (L) 8.9 - 10.3 mg/dL  ? Phosphorus 8.3 (H) 2.5 - 4.6  mg/dL  ? Albumin 3.2 (L) 3.5 - 5.0 g/dL  ? GFR, Estimated 4 (L) >60 mL/min  ? Anion gap 16 (H) 5 - 15  ?Hepatitis B surface antibody,qualitative     Status: None  ? Collection Time: 01/23/22  6:28 AM  ?Result Value Ref Range  ? Hep B S Ab NON REACTIVE NON REACTIVE  ?Glucose, capillary     Status: None  ? Collection Time: 01/23/22  8:29 AM  ?Result Value Ref Range  ? Glucose-Capillary 82 70 - 99  mg/dL  ? Comment 1 Notify RN   ?  ?I have Reviewed nursing notes, Vitals, and Lab results since pt's last encounter. Pertinent lab results : see above ?I have ordered test including BMP, CBC, Mg ?I have reviewed the last note from staff over past 24 hours ?I have discussed pt's care plan and test results with nursing staff, case manager ? ? LOS: 2 days  ? ?Dwyane Dee, MD ?[facility] ?01/23/2022, 5:14 PM ? ?

## 2022-01-23 NOTE — Progress Notes (Signed)
PT Cancellation Note ? ?Patient Details ?Name: JAMERIA BRADWAY ?MRN: [id number] ?DOB: June 24, 1951 ? ? ?Cancelled Treatment:    Reason Eval/Treat Not Completed: Patient at procedure or test/unavailable.  Pt currently having surgery for dialysis catheter insertion according to chart.  Pt will re-attempt to see pt at later date/time as medically appropriate. ? ? ?Gwenlyn Saran, PT, DPT ?01/23/22, 2:36 PM ? ?

## 2022-01-23 NOTE — Plan of Care (Signed)
?  Problem: Education: ?Goal: Knowledge of General Education information will improve ?Description: Including pain rating scale, medication(s)/side effects and non-pharmacologic comfort measures ?Outcome: Progressing ?  ?Problem: Health Behavior/Discharge Planning: ?Goal: Ability to manage health-related needs will improve ?Outcome: Progressing ?  ?Problem: Clinical Measurements: ?Goal: Ability to maintain clinical measurements within normal limits will improve ?Outcome: Progressing ?  ?Problem: Clinical Measurements: ?Goal: Diagnostic test results will improve ?Outcome: Progressing ?  ?Problem: Clinical Measurements: ?Goal: Respiratory complications will improve ?Outcome: Progressing ?  ?Problem: Clinical Measurements: ?Goal: Cardiovascular complication will be avoided ?Outcome: Progressing ?  ?Problem: Nutrition: ?Goal: Adequate nutrition will be maintained ?Outcome: Progressing ?  ?Problem: Coping: ?Goal: Level of anxiety will decrease ?Outcome: Progressing ?  ?Problem: Skin Integrity: ?Goal: Risk for impaired skin integrity will decrease ?Outcome: Progressing ?  ?

## 2022-01-23 NOTE — Care Management Important Message (Signed)
Important Message ? ?Patient Details  ?Name: Anita Greene ?MRN: [id number] ?Date of Birth: [birth year] ? ? ?Medicare Important Message Given:  N/A - LOS <3 / Initial given by admissions ? ? ? ? ?Juliann Pulse A Kaye Mitro ?01/23/2022, 7:48 AM ?

## 2022-01-23 NOTE — Progress Notes (Signed)
Patient taken to dialysis. Alert and oriented on arrival to dialysis and able to answer questions.  ?

## 2022-01-23 NOTE — Progress Notes (Signed)
Patient started first dialysis treatment today post insertion of right jugular catheter. Patient complain of discomfort to area and was given tylenol which was effective with managing pain. Patient tolerated 2 hours of treatment, vital sign stable and no adverse effects noted or reported. Report given to Jocelyn Lamer, RN who was covering for the primary nurse. ?

## 2022-01-24 DIAGNOSIS — N179 Acute kidney failure, unspecified: Secondary | ICD-10-CM | POA: Diagnosis not present

## 2022-01-24 DIAGNOSIS — N1832 Chronic kidney disease, stage 3b: Secondary | ICD-10-CM | POA: Diagnosis not present

## 2022-01-24 LAB — CBC WITH DIFFERENTIAL/PLATELET
Abs Immature Granulocytes: 0.06 10*3/uL (ref 0.00–0.07)
Basophils Absolute: 0 10*3/uL (ref 0.0–0.1)
Basophils Relative: 0 %
Eosinophils Absolute: 0.1 10*3/uL (ref 0.0–0.5)
Eosinophils Relative: 1 %
HCT: 26.3 % — ABNORMAL LOW (ref 36.0–46.0)
Hemoglobin: 9.1 g/dL — ABNORMAL LOW (ref 12.0–15.0)
Immature Granulocytes: 1 %
Lymphocytes Relative: 25 %
Lymphs Abs: 2.5 10*3/uL (ref 0.7–4.0)
MCH: 30.1 pg (ref 26.0–34.0)
MCHC: 34.6 g/dL (ref 30.0–36.0)
MCV: 87.1 fL (ref 80.0–100.0)
Monocytes Absolute: 0.7 10*3/uL (ref 0.1–1.0)
Monocytes Relative: 7 %
Neutro Abs: 6.6 10*3/uL (ref 1.7–7.7)
Neutrophils Relative %: 66 %
Platelets: 275 10*3/uL (ref 150–400)
RBC: 3.02 MIL/uL — ABNORMAL LOW (ref 3.87–5.11)
RDW: 14.5 % (ref 11.5–15.5)
WBC: 10 10*3/uL (ref 4.0–10.5)
nRBC: 0 % (ref 0.0–0.2)

## 2022-01-24 LAB — RENAL FUNCTION PANEL
Albumin: 3.3 g/dL — ABNORMAL LOW (ref 3.5–5.0)
Anion gap: 12 (ref 5–15)
BUN: 58 mg/dL — ABNORMAL HIGH (ref 8–23)
CO2: 25 mmol/L (ref 22–32)
Calcium: 7.9 mg/dL — ABNORMAL LOW (ref 8.9–10.3)
Chloride: 99 mmol/L (ref 98–111)
Creatinine, Ser: 5.78 mg/dL — ABNORMAL HIGH (ref 0.44–1.00)
GFR, Estimated: 7 mL/min — ABNORMAL LOW (ref >60)
Glucose, Bld: 96 mg/dL (ref 70–99)
Phosphorus: 5.7 mg/dL — ABNORMAL HIGH (ref 2.5–4.6)
Potassium: 3.4 mmol/L — ABNORMAL LOW (ref 3.5–5.1)
Sodium: 136 mmol/L (ref 135–145)

## 2022-01-24 LAB — HEPATITIS B SURFACE ANTIBODY, QUANTITATIVE: Hep B S AB Quant (Post): <3.1 m[IU]/mL — ABNORMAL LOW (ref >9.9)

## 2022-01-24 LAB — MAGNESIUM: Magnesium: 1.7 mg/dL (ref 1.7–2.4)

## 2022-01-24 MED ORDER — HEPARIN SODIUM (PORCINE) 1000 UNIT/ML IJ SOLN
INTRAMUSCULAR | Status: AC
  Filled 2022-01-24: qty 10

## 2022-01-24 NOTE — Plan of Care (Signed)

## 2022-01-24 NOTE — Progress Notes (Signed)
[facility] ?[place], [place] ?01/24/22 ? ?Subjective:  ? ?Hospital day # 3 ? ?Patient known to our practice from outpatient follow-up. ?She presents to the emergency room for not feeling well for the past 2 to 3 weeks.  She had lower extremity weakness and states that she was not able to walk even to 3 steps. ? ?Update: ?Patient seen resting comfortably ?Alert and oriented ?Denies pain and discomfort ?Denies shortness of breath ?Tolerating meals ? ? ?Objective:  ?Vital signs in last 24 hours:  ?Temp:  [97.6 ?F (36.4 ?C)-98.6 ?F (37 ?C)] 97.9 ?F (36.6 ?C) (04/01 0815) ?Pulse Rate:  [78-101] 89 (04/01 0815) ?Resp:  [11-28] 14 (04/01 0815) ?BP: (84-163)/(63-119) 130/82 (04/01 0815) ?SpO2:  [84 %-100 %] 97 % (04/01 0815) ?Weight:  [59.4 kg-60.3 kg] 60.2 kg (04/01 0451) ? ?Weight change: -4.379 kg ?Filed Weights  ? 01/23/22 1116 01/23/22 1435 01/24/22 0451  ?Weight: 59.4 kg 60.3 kg 60.2 kg  ? ? ?Intake/Output: ?  ? ?Intake/Output Summary (Last 24 hours) at 01/24/2022 0956 ?Last data filed at 01/24/2022 0300 ?Gross per 24 hour  ?Intake 240 ml  ?Output 357 ml  ?Net -117 ml  ? ? ? ?Physical Exam: ?General:  No acute distress, laying in the bed  ?HEENT  anicteric, moist oral mucous membrane  ?Pulm/lungs  normal breathing effort, lungs are clear to auscultation  ?CVS/Heart  regular rhythm, no rub or gallop  ?Abdomen:   Soft, nontender  ?Extremities:  No peripheral edema  ?Neurologic:  Alert, oriented, able to follow commands  ?Skin:  No acute rashes  ?Access: Rt Permcath ? ?Basic Metabolic Panel: ? ?Recent Labs  ?Lab 01/21/22 ?1036 01/21/22 ?1214 01/22/22 ?2595 01/23/22 ?6387 01/24/22 ?5643  ?NA 134*  --  137 137 136  ?K 3.7  --  3.8 3.2* 3.4*  ?CL 106  --  105 105 99  ?CO2 11*  --  18* 16* 25  ?GLUCOSE 133*  --  131* 85 96  ?BUN 99*  --  99* 93* 58*  ?CREATININE 10.53*  --  9.46* 9.05* 5.78*  ?CALCIUM 7.3*  --  7.2* 6.7* 7.9*  ?MG  --  1.4* 1.8 1.6* 1.7  ?PHOS  --  9.6* 9.0* 8.3* 5.7*  ? ? ? ? ?CBC: ?Recent  Labs  ?Lab 01/21/22 ?1036 01/22/22 ?3295 01/23/22 ?1884 01/24/22 ?1660  ?WBC 11.4* 9.0 9.2 10.0  ?NEUTROABS  --  6.2 6.0 6.6  ?HGB 7.9* 6.3* 8.2* 9.1*  ?HCT 24.4* 19.0* 23.7* 26.3*  ?MCV 92.8 89.6 86.5 87.1  ?PLT 339 303 265 275  ? ? ? ?  ?Lab Results  ?Component Value Date  ? HEPBSAG NON REACTIVE 01/23/2022  ? HEPBSAB NON REACTIVE 01/23/2022  ? ? ? ? ?Microbiology: ? ?Recent Results (from the past 240 hour(s))  ?Resp Panel by RT-PCR (Flu A&B, Covid) Nasopharyngeal Swab     Status: None  ? Collection Time: 01/21/22  3:47 PM  ? Specimen: Nasopharyngeal Swab; Nasopharyngeal(NP) swabs in vial transport medium  ?Result Value Ref Range Status  ? SARS Coronavirus 2 by RT PCR NEGATIVE NEGATIVE Final  ?  Comment: (NOTE) ?SARS-CoV-2 target nucleic acids are NOT DETECTED. ? ?The SARS-CoV-2 RNA is generally detectable in upper respiratory ?specimens during the acute phase of infection. The lowest ?concentration of SARS-CoV-2 viral copies this assay can detect is ?138 copies/mL. A negative result does not preclude SARS-Cov-2 ?infection and should not be used as the sole basis for treatment or ?other patient management decisions. A negative  result may occur with  ?improper specimen collection/handling, submission of specimen other ?than nasopharyngeal swab, presence of viral mutation(s) within the ?areas targeted by this assay, and inadequate number of viral ?copies(<138 copies/mL). A negative result must be combined with ?clinical observations, patient history, and epidemiological ?information. The expected result is Negative. ? ?Fact Sheet for Patients:  ?EntrepreneurPulse.com.au ? ?Fact Sheet for Healthcare Providers:  ?IncredibleEmployment.be ? ?This test is no t yet approved or cleared by the [place] FDA and  ?has been authorized for detection and/or diagnosis of SARS-CoV-2 by ?FDA under an Emergency Use Authorization (EUA). This EUA will remain  ?in effect (meaning this test can be  used) for the duration of the ?COVID-19 declaration under Section 564(b)(1) of the Act, 21 ?U.S.C.section 360bbb-3(b)(1), unless the authorization is terminated  ?or revoked sooner.  ? ? ?  ? Influenza A by PCR NEGATIVE NEGATIVE Final  ? Influenza B by PCR NEGATIVE NEGATIVE Final  ?  Comment: (NOTE) ?The Xpert Xpress SARS-CoV-2/FLU/RSV plus assay is intended as an aid ?in the diagnosis of influenza from Nasopharyngeal swab specimens and ?should not be used as a sole basis for treatment. Nasal washings and ?aspirates are unacceptable for Xpert Xpress SARS-CoV-2/FLU/RSV ?testing. ? ?Fact Sheet for Patients: ?EntrepreneurPulse.com.au ? ?Fact Sheet for Healthcare Providers: ?IncredibleEmployment.be ? ?This test is not yet approved or cleared by the [place] FDA and ?has been authorized for detection and/or diagnosis of SARS-CoV-2 by ?FDA under an Emergency Use Authorization (EUA). This EUA will remain ?in effect (meaning this test can be used) for the duration of the ?COVID-19 declaration under Section 564(b)(1) of the Act, 21 U.S.C. ?section 360bbb-3(b)(1), unless the authorization is terminated or ?revoked. ? ?Performed at [facility], [place], ?[place] [address] ?  ? ? ?Coagulation Studies: ?No results for input(s): LABPROT, INR in the last 72 hours. ? ?Urinalysis: ?Recent Labs  ?  01/21/22 ?1900  ?COLORURINE YELLOW*  ?LABSPEC 1.009  ?PHURINE 5.0  ?GLUCOSEU NEGATIVE  ?HGBUR SMALL*  ?BILIRUBINUR NEGATIVE  ?KETONESUR NEGATIVE  ?PROTEINUR NEGATIVE  ?NITRITE NEGATIVE  ?LEUKOCYTESUR LARGE*  ? ?  ? ? ?Imaging: ?CT ABDOMEN PELVIS WO CONTRAST ? ?Result Date: 01/22/2022 ?CLINICAL DATA:  History of gastric bypass surgery with nausea and vomiting. EXAM: CT ABDOMEN AND PELVIS WITHOUT CONTRAST TECHNIQUE: Multidetector CT imaging of the abdomen and pelvis was performed following the standard protocol without IV contrast. RADIATION DOSE REDUCTION: This exam was  performed according to the departmental dose-optimization program which includes automated exposure control, adjustment of the mA and/or kV according to patient size and/or use of iterative reconstruction technique. COMPARISON:  July 14, 2011 FINDINGS: Lower chest: No acute abnormality. Hepatobiliary: No focal liver abnormality is seen. Status post cholecystectomy. The common bile duct measures approximately 9.3 mm in diameter. Pancreas: Unremarkable. No pancreatic ductal dilatation or surrounding inflammatory changes. Spleen: Normal in size without focal abnormality. Adrenals/Urinary Tract: A stable 3.7 cm x 3.3 cm low-attenuation (approximately 1.35 Hounsfield units) left adrenal mass is seen. The right adrenal gland is normal in appearance. Kidneys are normal in size, without obstructing renal calculi, focal lesion, or hydronephrosis. Bilateral 2 mm and 3 mm nonobstructing renal calculi are seen. Stable,, likely benign approximately 1.9 cm x 1.5 cm, 1.4 cm x 0.6 cm and 1.1 cm x 1.0 cm hyperdense foci are seen along the base of an otherwise normal appearing urinary bladder. Stomach/Bowel: There is a small to moderate sized hiatal hernia. Surgical sutures are seen within the gastric region. Surgically anastomosed  bowel is also noted within the anterior aspect of the mid left abdomen. Appendix appears normal. No evidence of bowel wall thickening, distention, or inflammatory changes. Vascular/Lymphatic: Mild aortic atherosclerosis. No enlarged abdominal or pelvic lymph nodes. Reproductive: Uterus and bilateral adnexa are unremarkable. Other: No abdominal wall hernia or abnormality. No abdominopelvic ascites. Musculoskeletal: No acute or significant osseous findings. IMPRESSION: 1. Bilateral 2 mm and 3 mm nonobstructing renal calculi. 2. Stable left adrenal adenoma. 3. Small to moderate sized hiatal hernia. 4. Evidence of prior gastric bypass surgery. 5. Mild aortic atherosclerosis. Aortic Atherosclerosis  (ICD10-I70.0). Electronically Signed   By: Virgina Norfolk M.D.   On: 01/22/2022 22:39  ? ?PERIPHERAL VASCULAR CATHETERIZATION ? ?Result Date: 01/23/2022 ?See surgical note for result.  ? ? ?Medications:  ? ? sod

## 2022-01-24 NOTE — Progress Notes (Signed)
Patient started second dialysis treatment this morning to insertion of right jugular catheter.  Patient tolerated 2 hours of treatment, vital sign stable and no adverse effects noted or reported. Report given to Andrill D. Livia Snellen, Therapist, sports. ?

## 2022-01-24 NOTE — Progress Notes (Signed)
?Progress Note ? ? ? ?Anita Greene   ?UVO:[id number]  ?DOB: 1950-11-07  ?DOA: 01/21/2022     3 ?PCP: Maryland Pink, MD ? ?Initial CC: weakness, shaking ? ?Hospital Course: ?Ms. Micheletti is a 71 yo female with PMH CKD3, GERD, B12 deficiency, HTN, IDA, OSA, RLS, CVA, DMII who presented with progressively worsening fatigue/malaise, lower extremity weakness, and hand tremors.  She is followed closely outpatient by nephrology and appears to have had rapidly progressive worsening renal function.  Per nephrology, renal biopsy October 2022 showed arterionephrosclerosis, severe global glomerulosclerosis, interstitial fibrosis. ?She was admitted to have dialysis initiated inpatient.  She underwent PermCath placement followed by initial dialysis session on 01/23/2022. ? ?Interval History:  ?No events overnight. Resting in bed comfortable today when seen after HD. She is wanting to go go rehab at discharge and her son is also wanting her to go as well.  ? ?Assessment and Plan: ?* Acute renal failure superimposed on stage 3b chronic kidney disease ([place]) ?- Rapidly progressive renal failure; she is followed closely outpatient by nephrology.  Suspicion is that she has progressed to ESRD ?- Patient presented with weakness/fatigue and UE tremor ?- labs show rapid progression of renal failure; appreciate nephrology evaluation ?- s/p RIJ permcath placed by vascular surgery on 3/31 ?- 1st HD session 3/31 ?- tolerated session today too ?- outpatient plan is for traditional HD, she will need an HD chair secured prior to being able to d/c home ? ?Anemia of chronic disease ?- Likely mixed etiology with history of iron deficiency anemia and worsening renal function ?- Hgb 6.3 g/dL on 3/30, s/p 1 unit PRBC ?- Iron studies on admission considered adequate ?-Continue trending CBC ? ?Physical deconditioning ?- suspected due to renal failure ?- CT C/L spine on admission: mild/mod DDD in C spine. No acute findings in L spine. CTH showed patchy  areas of chronic small vessel disease  ?- monitor response after HD started: Does note improvement in her weakness ?- Continue PT while hospitalized; currently SNF recommended and patient/son want her to go to rehab as well; this might affect timing/location with HD, will discuss with CM and staff on Monday ? ?HTN (hypertension) ?- continue midodrine PRN ?-Verapamil and lisinopril/hctz on hold ? ?Hypomagnesemia ?- Replete as needed ? ?History of CVA (cerebrovascular accident) ?- continue asa ? ?GERD (gastroesophageal reflux disease) ?- Continue PPI ? ?Obstructive sleep apnea ?- Continue nightly CPAP ? ?Mood disorder ([place]) ?- Continue Wellbutrin, BuSpar, Cymbalta ? ?Type 2 diabetes mellitus with neurological manifestations, controlled ([place]) ?- continue SSI and CBG monitoring  ? ? ?Old records reviewed in assessment of this patient ? ?Antimicrobials: ? ? ?DVT prophylaxis:  ?SCDs Start: 01/21/22 1612 ? ? ?Code Status:   Code Status: Full Code ? ?Disposition Plan:  Tentative to SNF ?Status is: Inpt ? ?Objective: ?Blood pressure (!) 166/78, pulse 94, temperature 98.4 ?F (36.9 ?C), resp. rate 15, height '5\' 1"'$  (1.549 m), weight 56.9 kg, SpO2 99 %.  ?Examination:  ?Physical Exam ?Constitutional:   ?   Appearance: Normal appearance.  ?HENT:  ?   Head: Normocephalic and atraumatic.  ?   Mouth/Throat:  ?   Mouth: Mucous membranes are moist.  ?Eyes:  ?   Extraocular Movements: Extraocular movements intact.  ?Neck:  ?   Comments: RIJ perm cath in place ?Cardiovascular:  ?   Rate and Rhythm: Normal rate and regular rhythm.  ?Pulmonary:  ?   Effort: Pulmonary effort is normal.  ?   Breath sounds: Normal  breath sounds.  ?Abdominal:  ?   General: Bowel sounds are normal. There is no distension.  ?   Palpations: Abdomen is soft.  ?   Tenderness: There is no abdominal tenderness.  ?Musculoskeletal:     ?   General: No swelling. Normal range of motion.  ?   Cervical back: Normal range of motion and neck supple.  ?Skin: ?   General:  Skin is warm and dry.  ?Neurological:  ?   Mental Status: She is alert.  ?   Comments: No further tremor appreciated   ?Psychiatric:     ?   Mood and Affect: Mood normal.     ?   Behavior: Behavior normal.  ?  ? ?Consultants:  ?Nephrology ?Vascular surgery ? ?Procedures:  ?Permcath placement, 01/23/22 ? ?Data Reviewed: ?Results for orders placed or performed during the hospital encounter of 01/21/22 (from the past 24 hour(s))  ?CBC with Differential/Platelet     Status: Abnormal  ? Collection Time: 01/24/22  6:05 AM  ?Result Value Ref Range  ? WBC 10.0 4.0 - 10.5 K/uL  ? RBC 3.02 (L) 3.87 - 5.11 MIL/uL  ? Hemoglobin 9.1 (L) 12.0 - 15.0 g/dL  ? HCT 26.3 (L) 36.0 - 46.0 %  ? MCV 87.1 80.0 - 100.0 fL  ? MCH 30.1 26.0 - 34.0 pg  ? MCHC 34.6 30.0 - 36.0 g/dL  ? RDW 14.5 11.5 - 15.5 %  ? Platelets 275 150 - 400 K/uL  ? nRBC 0.0 0.0 - 0.2 %  ? Neutrophils Relative % 66 %  ? Neutro Abs 6.6 1.7 - 7.7 K/uL  ? Lymphocytes Relative 25 %  ? Lymphs Abs 2.5 0.7 - 4.0 K/uL  ? Monocytes Relative 7 %  ? Monocytes Absolute 0.7 0.1 - 1.0 K/uL  ? Eosinophils Relative 1 %  ? Eosinophils Absolute 0.1 0.0 - 0.5 K/uL  ? Basophils Relative 0 %  ? Basophils Absolute 0.0 0.0 - 0.1 K/uL  ? Immature Granulocytes 1 %  ? Abs Immature Granulocytes 0.06 0.00 - 0.07 K/uL  ?Magnesium     Status: None  ? Collection Time: 01/24/22  6:05 AM  ?Result Value Ref Range  ? Magnesium 1.7 1.7 - 2.4 mg/dL  ?Renal function panel     Status: Abnormal  ? Collection Time: 01/24/22  6:05 AM  ?Result Value Ref Range  ? Sodium 136 135 - 145 mmol/L  ? Potassium 3.4 (L) 3.5 - 5.1 mmol/L  ? Chloride 99 98 - 111 mmol/L  ? CO2 25 22 - 32 mmol/L  ? Glucose, Bld 96 70 - 99 mg/dL  ? BUN 58 (H) 8 - 23 mg/dL  ? Creatinine, Ser 5.78 (H) 0.44 - 1.00 mg/dL  ? Calcium 7.9 (L) 8.9 - 10.3 mg/dL  ? Phosphorus 5.7 (H) 2.5 - 4.6 mg/dL  ? Albumin 3.3 (L) 3.5 - 5.0 g/dL  ? GFR, Estimated 7 (L) >60 mL/min  ? Anion gap 12 5 - 15  ?  ?I have Reviewed nursing notes, Vitals, and Lab results  since pt's last encounter. Pertinent lab results : see above ?I have ordered test including BMP, CBC, Mg ?I have reviewed the last note from staff over past 24 hours ?I have discussed pt's care plan and test results with nursing staff, case manager ? ? LOS: 3 days  ? ?Dwyane Dee, MD ?[facility] ?01/24/2022, 4:15 PM ? ?

## 2022-01-24 NOTE — TOC Progression Note (Signed)
Transition of Care (TOC) - Progression Note  ? ? ?Patient Details  ?Name: Anita Greene ?MRN: [id number] ?Date of Birth: 28-Mar-1951 ? ?Transition of Care (TOC) CM/SW Contact  ?Raina Mina, LCSWA ?Phone Number: ?01/24/2022, 3:23 PM ? ?Clinical Narrative:  CSW Discussed bed offers with patient. Patient would like to discuss with her family before making a decision.   ? ? ? ?  ?  ? ?Expected Discharge Plan and Services ?  ?  ?  ?  ?  ?                ?  ?  ?  ?  ?  ?  ?  ?  ?  ?  ? ? ?Social Determinants of Health (SDOH) Interventions ?  ? ?Readmission Risk Interventions ?   ? View : No data to display.  ?  ?  ?  ? ? ?

## 2022-01-25 DIAGNOSIS — N1832 Chronic kidney disease, stage 3b: Secondary | ICD-10-CM | POA: Diagnosis not present

## 2022-01-25 DIAGNOSIS — N179 Acute kidney failure, unspecified: Secondary | ICD-10-CM | POA: Diagnosis not present

## 2022-01-25 LAB — RENAL FUNCTION PANEL
Albumin: 3.3 g/dL — ABNORMAL LOW (ref 3.5–5.0)
Anion gap: 10 (ref 5–15)
BUN: 32 mg/dL — ABNORMAL HIGH (ref 8–23)
CO2: 28 mmol/L (ref 22–32)
Calcium: 8.6 mg/dL — ABNORMAL LOW (ref 8.9–10.3)
Chloride: 100 mmol/L (ref 98–111)
Creatinine, Ser: 4.05 mg/dL — ABNORMAL HIGH (ref 0.44–1.00)
GFR, Estimated: 11 mL/min — ABNORMAL LOW (ref >60)
Glucose, Bld: 119 mg/dL — ABNORMAL HIGH (ref 70–99)
Phosphorus: 4.2 mg/dL (ref 2.5–4.6)
Potassium: 3.2 mmol/L — ABNORMAL LOW (ref 3.5–5.1)
Sodium: 138 mmol/L (ref 135–145)

## 2022-01-25 LAB — CBC WITH DIFFERENTIAL/PLATELET
Abs Immature Granulocytes: 0.09 10*3/uL — ABNORMAL HIGH (ref 0.00–0.07)
Basophils Absolute: 0.1 10*3/uL (ref 0.0–0.1)
Basophils Relative: 0 %
Eosinophils Absolute: 0.1 10*3/uL (ref 0.0–0.5)
Eosinophils Relative: 1 %
HCT: 27.1 % — ABNORMAL LOW (ref 36.0–46.0)
Hemoglobin: 9 g/dL — ABNORMAL LOW (ref 12.0–15.0)
Immature Granulocytes: 1 %
Lymphocytes Relative: 24 %
Lymphs Abs: 2.8 10*3/uL (ref 0.7–4.0)
MCH: 29.2 pg (ref 26.0–34.0)
MCHC: 33.2 g/dL (ref 30.0–36.0)
MCV: 88 fL (ref 80.0–100.0)
Monocytes Absolute: 1 10*3/uL (ref 0.1–1.0)
Monocytes Relative: 9 %
Neutro Abs: 7.5 10*3/uL (ref 1.7–7.7)
Neutrophils Relative %: 65 %
Platelets: 269 10*3/uL (ref 150–400)
RBC: 3.08 MIL/uL — ABNORMAL LOW (ref 3.87–5.11)
RDW: 14.4 % (ref 11.5–15.5)
WBC: 11.6 10*3/uL — ABNORMAL HIGH (ref 4.0–10.5)
nRBC: 0 % (ref 0.0–0.2)

## 2022-01-25 LAB — MAGNESIUM: Magnesium: 1.8 mg/dL (ref 1.7–2.4)

## 2022-01-25 MED ORDER — HEPARIN SODIUM (PORCINE) 5000 UNIT/ML IJ SOLN
5000.0000 [IU] | Freq: Two times a day (BID) | INTRAMUSCULAR | Status: DC
  Administered 2022-01-25 – 2022-01-29 (×7): 5000 [IU] via SUBCUTANEOUS
  Filled 2022-01-25 (×7): qty 1

## 2022-01-25 MED ORDER — POTASSIUM CHLORIDE CRYS ER 20 MEQ PO TBCR
40.0000 meq | EXTENDED_RELEASE_TABLET | Freq: Once | ORAL | Status: AC
  Administered 2022-01-25: 40 meq via ORAL
  Filled 2022-01-25: qty 2

## 2022-01-25 NOTE — Progress Notes (Addendum)
[facility] ?[place], [place] ?01/25/22 ? ?Subjective:  ? ?Hospital day # 4 ? ?Patient known to our practice from outpatient follow-up. ?She presents to the emergency room for not feeling well for the past 2 to 3 weeks.  She had lower extremity weakness and states that she was not able to walk even to 3 steps. ? ?Update: ?Patient seen resting in bed ?Alert and oriented ?States she feels better today ?Feels her depression has decreased ? ?Creatinine 4.05 ?BUN 32 ?Potassium 3.2 ?Urine output recorded as 3 occurrences. ? ?Objective:  ?Vital signs in last 24 hours:  ?Temp:  [98.2 ?F (36.8 ?C)-99.4 ?F (37.4 ?C)] 98.6 ?F (37 ?C) (04/02 0825) ?Pulse Rate:  [83-94] 89 (04/02 0825) ?Resp:  [10-18] 18 (04/02 0825) ?BP: (102-166)/(64-90) 124/69 (04/02 0825) ?SpO2:  [94 %-100 %] 95 % (04/02 0825) ?Weight:  [56.9 kg-59.1 kg] 59.1 kg (04/02 0500) ? ?Weight change: -2.221 kg ?Filed Weights  ? 01/24/22 1055 01/24/22 1353 01/25/22 0500  ?Weight: 57.2 kg 56.9 kg 59.1 kg  ? ? ?Intake/Output: ?  ? ?Intake/Output Summary (Last 24 hours) at 01/25/2022 0953 ?Last data filed at 01/25/2022 0441 ?Gross per 24 hour  ?Intake 0 ml  ?Output 1 ml  ?Net -1 ml  ? ? ? ?Physical Exam: ?General:  No acute distress, laying in the bed  ?HEENT  anicteric, moist oral mucous membrane  ?Pulm/lungs  normal breathing effort, lungs are clear to auscultation  ?CVS/Heart  regular rhythm, no rub or gallop  ?Abdomen:   Soft, nontender  ?Extremities:  No peripheral edema  ?Neurologic:  Alert, oriented, able to follow commands  ?Skin:  No acute rashes  ?Access: Rt Permcath ? ?Basic Metabolic Panel: ? ?Recent Labs  ?Lab 01/21/22 ?1036 01/21/22 ?1214 01/22/22 ?9450 01/23/22 ?3888 01/24/22 ?2800 01/25/22 ?0510  ?NA 134*  --  137 137 136 138  ?K 3.7  --  3.8 3.2* 3.4* 3.2*  ?CL 106  --  105 105 99 100  ?CO2 11*  --  18* 16* 25 28  ?GLUCOSE 133*  --  131* 85 96 119*  ?BUN 99*  --  99* 93* 58* 32*  ?CREATININE 10.53*  --  9.46* 9.05* 5.78* 4.05*  ?CALCIUM  7.3*  --  7.2* 6.7* 7.9* 8.6*  ?MG  --  1.4* 1.8 1.6* 1.7 1.8  ?PHOS  --  9.6* 9.0* 8.3* 5.7* 4.2  ? ? ? ? ?CBC: ?Recent Labs  ?Lab 01/21/22 ?1036 01/22/22 ?3491 01/23/22 ?7915 01/24/22 ?0569 01/25/22 ?0510  ?WBC 11.4* 9.0 9.2 10.0 11.6*  ?NEUTROABS  --  6.2 6.0 6.6 7.5  ?HGB 7.9* 6.3* 8.2* 9.1* 9.0*  ?HCT 24.4* 19.0* 23.7* 26.3* 27.1*  ?MCV 92.8 89.6 86.5 87.1 88.0  ?PLT 339 303 265 275 269  ? ? ? ?  ?Lab Results  ?Component Value Date  ? HEPBSAG NON REACTIVE 01/23/2022  ? HEPBSAB NON REACTIVE 01/23/2022  ? ? ? ? ?Microbiology: ? ?Recent Results (from the past 240 hour(s))  ?Resp Panel by RT-PCR (Flu A&B, Covid) Nasopharyngeal Swab     Status: None  ? Collection Time: 01/21/22  3:47 PM  ? Specimen: Nasopharyngeal Swab; Nasopharyngeal(NP) swabs in vial transport medium  ?Result Value Ref Range Status  ? SARS Coronavirus 2 by RT PCR NEGATIVE NEGATIVE Final  ?  Comment: (NOTE) ?SARS-CoV-2 target nucleic acids are NOT DETECTED. ? ?The SARS-CoV-2 RNA is generally detectable in upper respiratory ?specimens during the acute phase of infection. The lowest ?concentration of SARS-CoV-2 viral copies  this assay can detect is ?138 copies/mL. A negative result does not preclude SARS-Cov-2 ?infection and should not be used as the sole basis for treatment or ?other patient management decisions. A negative result may occur with  ?improper specimen collection/handling, submission of specimen other ?than nasopharyngeal swab, presence of viral mutation(s) within the ?areas targeted by this assay, and inadequate number of viral ?copies(<138 copies/mL). A negative result must be combined with ?clinical observations, patient history, and epidemiological ?information. The expected result is Negative. ? ?Fact Sheet for Patients:  ?EntrepreneurPulse.com.au ? ?Fact Sheet for Healthcare Providers:  ?IncredibleEmployment.be ? ?This test is no t yet approved or cleared by the [place] FDA and  ?has been  authorized for detection and/or diagnosis of SARS-CoV-2 by ?FDA under an Emergency Use Authorization (EUA). This EUA will remain  ?in effect (meaning this test can be used) for the duration of the ?COVID-19 declaration under Section 564(b)(1) of the Act, 21 ?U.S.C.section 360bbb-3(b)(1), unless the authorization is terminated  ?or revoked sooner.  ? ? ?  ? Influenza A by PCR NEGATIVE NEGATIVE Final  ? Influenza B by PCR NEGATIVE NEGATIVE Final  ?  Comment: (NOTE) ?The Xpert Xpress SARS-CoV-2/FLU/RSV plus assay is intended as an aid ?in the diagnosis of influenza from Nasopharyngeal swab specimens and ?should not be used as a sole basis for treatment. Nasal washings and ?aspirates are unacceptable for Xpert Xpress SARS-CoV-2/FLU/RSV ?testing. ? ?Fact Sheet for Patients: ?EntrepreneurPulse.com.au ? ?Fact Sheet for Healthcare Providers: ?IncredibleEmployment.be ? ?This test is not yet approved or cleared by the [place] FDA and ?has been authorized for detection and/or diagnosis of SARS-CoV-2 by ?FDA under an Emergency Use Authorization (EUA). This EUA will remain ?in effect (meaning this test can be used) for the duration of the ?COVID-19 declaration under Section 564(b)(1) of the Act, 21 U.S.C. ?section 360bbb-3(b)(1), unless the authorization is terminated or ?revoked. ? ?Performed at [facility], [place], ?[place] [address] ?  ? ? ?Coagulation Studies: ?No results for input(s): LABPROT, INR in the last 72 hours. ? ?Urinalysis: ?No results for input(s): COLORURINE, LABSPEC, [place], GLUCOSEU, HGBUR, BILIRUBINUR, KETONESUR, PROTEINUR, UROBILINOGEN, NITRITE, LEUKOCYTESUR in the last 72 hours. ? ?Invalid input(s): APPERANCEUR ?  ? ? ?Imaging: ?PERIPHERAL VASCULAR CATHETERIZATION ? ?Result Date: 01/23/2022 ?See surgical note for result.  ? ? ?Medications:  ? ? sodium chloride    ? sodium chloride    ? ? aspirin EC  81 mg Oral QHS  ? buPROPion  300 mg Oral  Daily  ? busPIRone  15 mg Oral TID  ? calcium acetate  1,334 mg Oral TID WC  ? Chlorhexidine Gluconate Cloth  6 each Topical Q0600  ? darbepoetin (ARANESP) injection - NON-DIALYSIS  100 mcg Subcutaneous Q Thu-1800  ? DULoxetine  30 mg Oral Daily  ? feeding supplement  237 mL Oral BID BM  ? ferrous sulfate  325 mg Oral BID WC  ? heparin injection (subcutaneous)  5,000 Units Subcutaneous Q12H  ? potassium chloride  40 mEq Oral Once  ? sodium chloride flush  3 mL Intravenous Q12H  ? ?sodium chloride, sodium chloride, acetaminophen **OR** acetaminophen, albuterol, alteplase, heparin, lidocaine (PF), lidocaine-prilocaine, midodrine, pentafluoroprop-tetrafluoroeth, zolpidem ? ?Assessment/ Plan:  ?71 y.o. female with fibromyalgia, chronic low back pain, history of anxiety and depression, history of stroke in 2009, diabetes 2009, obstructive sleep apnea, right hearing loss, history of kidney stones, frequent UTIs, history of bladder tack, history of nonsteroidal use-meloxicam, gastric bypass surgery in June 2020  admitted on 01/21/2022 for ?Weakness [R53.1] ?Acute renal failure (ARF) (HCC) [N17.9] ?Acute renal failure superimposed on chronic kidney disease, unspecified CKD stage, unspecified acute renal failure type ([place]) [N17.9, N18.9] ? ?#End-stage renal disease with hypokalemia ?Patient's renal failure has progressed to end-stage renal disease. ?CKD risk factors include diabetes, hypertension, kidney stones, frequent UTIs, history of nonsteroidal use, IV contrast exposure, atherosclerosis and advanced age. ?Patient had a renal biopsy in October 2022 which was consistent with arterionephrosclerosis, severe global glomerulosclerosis, interstitial fibrosis all features consistent with diabetic, sclerosis.  Calcium oxalate crystals were also noted. ? ?Plan ?-Rt permcath placed by vascular on 01/23/22. Dialysis initiated after placement.  ?-Patient received second dialysis treatment yesterday, tolerated well.  No UF.  Plan to  dialyze patient tomorrow for third treatment, preferably seated in chair to prepare for outpatient placement.  We will perform treatment with UF goal 0.5 to 1 L as tolerated. ?-Potassium will be corre

## 2022-01-25 NOTE — Progress Notes (Signed)
?Progress Note ? ? ? ?Anita Greene   ?[id number]:[id number]  ?DOB: 01/09/1951  ?DOA: 01/21/2022     4 ?PCP: [place] Pink, MD ? ?Initial CC: weakness, shaking ? ?Hospital Course: ?Anita Greene is a 71 yo female with PMH CKD3, GERD, B12 deficiency, HTN, IDA, OSA, RLS, CVA, DMII who presented with progressively worsening fatigue/malaise, lower extremity weakness, and hand tremors.  She is followed closely outpatient by nephrology and appears to have had rapidly progressive worsening renal function.  Per nephrology, renal biopsy October 2022 showed arterionephrosclerosis, severe global glomerulosclerosis, interstitial fibrosis. ?She was admitted to have dialysis initiated inpatient.  She underwent PermCath placement followed by initial dialysis session on 01/23/2022. ? ?Interval History:  ?No events overnight.  Her situational tearfulness/depression is improved today compared to yesterday.  She continues to process the events that have led up to needing dialysis. ?No concerns or questions this morning.  She understands plan is for dialysis again tomorrow then still pursuing outpatient coordination for rehab and a dialysis chair. ? ?Assessment and Plan: ?* Acute renal failure superimposed on stage 3b chronic kidney disease ([place]) ?- Rapidly progressive renal failure; she is followed closely outpatient by nephrology.  Suspicion is that she has progressed to ESRD ?- Patient presented with weakness/fatigue and UE tremor ?- labs show rapid progression of renal failure; appreciate nephrology evaluation ?- s/p RIJ permcath placed by vascular surgery on 3/31 ?- 1st HD session 3/31 ?- tolerated session today too ?- outpatient plan is for traditional HD, she will need an HD chair secured prior to being able to d/c home ? ?Anemia of chronic disease ?- Likely mixed etiology with history of iron deficiency anemia and worsening renal function ?- Hgb 6.3 g/dL on 3/30, s/p 1 unit PRBC ?- Iron studies on admission considered adequate ?-Continue  trending CBC ? ?Physical deconditioning ?- suspected due to renal failure ?- CT C/L spine on admission: mild/mod DDD in C spine. No acute findings in L spine. CTH showed patchy areas of chronic small vessel disease  ?- monitor response after HD started: Does note improvement in her weakness ?- Continue PT while hospitalized; currently SNF recommended and patient/son want her to go to rehab as well; this might affect timing/location with HD, will discuss with CM and staff on Monday ? ?HTN (hypertension) ?- continue midodrine PRN ?-Verapamil and lisinopril/hctz on hold ? ?Hypomagnesemia ?- Replete as needed ? ?History of CVA (cerebrovascular accident) ?- continue asa ? ?GERD (gastroesophageal reflux disease) ?- Continue PPI ? ?Obstructive sleep apnea ?- Continue nightly CPAP ? ?Mood disorder ([place]) ?- Continue Wellbutrin, BuSpar, Cymbalta ? ?Type 2 diabetes mellitus with neurological manifestations, controlled ([place]) ?- continue SSI and CBG monitoring  ? ? ?Old records reviewed in assessment of this patient ? ?Antimicrobials: ? ? ?DVT prophylaxis:  ?heparin injection 5,000 Units Start: 01/25/22 2200 ?SCDs Start: 01/21/22 1612 ? ? ?Code Status:   Code Status: Full Code ? ?Disposition Plan:  Tentative to SNF ?Status is: Inpt ? ?Objective: ?Blood pressure 124/69, pulse 89, temperature 98.6 ?F (37 ?C), resp. rate 18, height '5\' 1"'$  (1.549 m), weight 59.1 kg, SpO2 95 %.  ?Examination:  ?Physical Exam ?Constitutional:   ?   Appearance: Normal appearance.  ?HENT:  ?   Head: Normocephalic and atraumatic.  ?   Mouth/Throat:  ?   Mouth: Mucous membranes are moist.  ?Eyes:  ?   Extraocular Movements: Extraocular movements intact.  ?Neck:  ?   Comments: RIJ perm cath in place ?Cardiovascular:  ?  Rate and Rhythm: Normal rate and regular rhythm.  ?Pulmonary:  ?   Effort: Pulmonary effort is normal.  ?   Breath sounds: Normal breath sounds.  ?Abdominal:  ?   General: Bowel sounds are normal. There is no distension.  ?   Palpations:  Abdomen is soft.  ?   Tenderness: There is no abdominal tenderness.  ?Musculoskeletal:     ?   General: No swelling. Normal range of motion.  ?   Cervical back: Normal range of motion and neck supple.  ?Skin: ?   General: Skin is warm and dry.  ?Neurological:  ?   Mental Status: She is alert.  ?   Comments: No further tremor appreciated   ?Psychiatric:     ?   Mood and Affect: Mood normal.     ?   Behavior: Behavior normal.  ?  ? ?Consultants:  ?Nephrology ?Vascular surgery ? ?Procedures:  ?Permcath placement, 01/23/22 ? ?Data Reviewed: ?Results for orders placed or performed during the hospital encounter of 01/21/22 (from the past 24 hour(s))  ?CBC with Differential/Platelet     Status: Abnormal  ? Collection Time: 01/25/22  5:10 AM  ?Result Value Ref Range  ? WBC 11.6 (H) 4.0 - 10.5 K/uL  ? RBC 3.08 (L) 3.87 - 5.11 MIL/uL  ? Hemoglobin 9.0 (L) 12.0 - 15.0 g/dL  ? HCT 27.1 (L) 36.0 - 46.0 %  ? MCV 88.0 80.0 - 100.0 fL  ? MCH 29.2 26.0 - 34.0 pg  ? MCHC 33.2 30.0 - 36.0 g/dL  ? RDW 14.4 11.5 - 15.5 %  ? Platelets 269 150 - 400 K/uL  ? nRBC 0.0 0.0 - 0.2 %  ? Neutrophils Relative % 65 %  ? Neutro Abs 7.5 1.7 - 7.7 K/uL  ? Lymphocytes Relative 24 %  ? Lymphs Abs 2.8 0.7 - 4.0 K/uL  ? Monocytes Relative 9 %  ? Monocytes Absolute 1.0 0.1 - 1.0 K/uL  ? Eosinophils Relative 1 %  ? Eosinophils Absolute 0.1 0.0 - 0.5 K/uL  ? Basophils Relative 0 %  ? Basophils Absolute 0.1 0.0 - 0.1 K/uL  ? Immature Granulocytes 1 %  ? Abs Immature Granulocytes 0.09 (H) 0.00 - 0.07 K/uL  ?Magnesium     Status: None  ? Collection Time: 01/25/22  5:10 AM  ?Result Value Ref Range  ? Magnesium 1.8 1.7 - 2.4 mg/dL  ?Renal function panel     Status: Abnormal  ? Collection Time: 01/25/22  5:10 AM  ?Result Value Ref Range  ? Sodium 138 135 - 145 mmol/L  ? Potassium 3.2 (L) 3.5 - 5.1 mmol/L  ? Chloride 100 98 - 111 mmol/L  ? CO2 28 22 - 32 mmol/L  ? Glucose, Bld 119 (H) 70 - 99 mg/dL  ? BUN 32 (H) 8 - 23 mg/dL  ? Creatinine, Ser 4.05 (H) 0.44 - 1.00  mg/dL  ? Calcium 8.6 (L) 8.9 - 10.3 mg/dL  ? Phosphorus 4.2 2.5 - 4.6 mg/dL  ? Albumin 3.3 (L) 3.5 - 5.0 g/dL  ? GFR, Estimated 11 (L) >60 mL/min  ? Anion gap 10 5 - 15  ?  ?I have Reviewed nursing notes, Vitals, and Lab results since pt's last encounter. Pertinent lab results : see above ?I have ordered test including BMP, CBC, Mg ?I have reviewed the last note from staff over past 24 hours ?I have discussed pt's care plan and test results with nursing staff, case manager ? ? LOS: 4 days  ? ?  Dwyane Dee, MD ?[facility] ?01/25/2022, 1:02 PM ? ?

## 2022-01-26 DIAGNOSIS — N179 Acute kidney failure, unspecified: Secondary | ICD-10-CM | POA: Diagnosis not present

## 2022-01-26 DIAGNOSIS — N1832 Chronic kidney disease, stage 3b: Secondary | ICD-10-CM | POA: Diagnosis not present

## 2022-01-26 DIAGNOSIS — D638 Anemia in other chronic diseases classified elsewhere: Secondary | ICD-10-CM | POA: Diagnosis not present

## 2022-01-26 LAB — RENAL FUNCTION PANEL
Albumin: 3.1 g/dL — ABNORMAL LOW (ref 3.5–5.0)
Anion gap: 10 (ref 5–15)
BUN: 37 mg/dL — ABNORMAL HIGH (ref 8–23)
CO2: 26 mmol/L (ref 22–32)
Calcium: 8.7 mg/dL — ABNORMAL LOW (ref 8.9–10.3)
Chloride: 99 mmol/L (ref 98–111)
Creatinine, Ser: 5.57 mg/dL — ABNORMAL HIGH (ref 0.44–1.00)
GFR, Estimated: 8 mL/min — ABNORMAL LOW (ref >60)
Glucose, Bld: 93 mg/dL (ref 70–99)
Phosphorus: 3.4 mg/dL (ref 2.5–4.6)
Potassium: 3.6 mmol/L (ref 3.5–5.1)
Sodium: 135 mmol/L (ref 135–145)

## 2022-01-26 LAB — CBC WITH DIFFERENTIAL/PLATELET
Abs Immature Granulocytes: 0.1 10*3/uL — ABNORMAL HIGH (ref 0.00–0.07)
Basophils Absolute: 0 10*3/uL (ref 0.0–0.1)
Basophils Relative: 0 %
Eosinophils Absolute: 0.2 10*3/uL (ref 0.0–0.5)
Eosinophils Relative: 2 %
HCT: 24.4 % — ABNORMAL LOW (ref 36.0–46.0)
Hemoglobin: 7.7 g/dL — ABNORMAL LOW (ref 12.0–15.0)
Immature Granulocytes: 1 %
Lymphocytes Relative: 30 %
Lymphs Abs: 3.5 10*3/uL (ref 0.7–4.0)
MCH: 29.2 pg (ref 26.0–34.0)
MCHC: 31.6 g/dL (ref 30.0–36.0)
MCV: 92.4 fL (ref 80.0–100.0)
Monocytes Absolute: 0.9 10*3/uL (ref 0.1–1.0)
Monocytes Relative: 7 %
Neutro Abs: 6.9 10*3/uL (ref 1.7–7.7)
Neutrophils Relative %: 60 %
Platelets: 272 10*3/uL (ref 150–400)
RBC: 2.64 MIL/uL — ABNORMAL LOW (ref 3.87–5.11)
RDW: 14.6 % (ref 11.5–15.5)
WBC: 11.6 10*3/uL — ABNORMAL HIGH (ref 4.0–10.5)
nRBC: 0 % (ref 0.0–0.2)

## 2022-01-26 LAB — MAGNESIUM: Magnesium: 1.8 mg/dL (ref 1.7–2.4)

## 2022-01-26 MED ORDER — HEPARIN SODIUM (PORCINE) 1000 UNIT/ML IJ SOLN
INTRAMUSCULAR | Status: AC
  Filled 2022-01-26: qty 10

## 2022-01-26 NOTE — Progress Notes (Signed)
?   01/24/22 1101 01/24/22 1115 01/24/22 1130  ?Vitals  ?BP (!) 151/71 131/72 137/69  ?MAP (mmHg) 92 90 90  ?Pulse Rate 85 83 86  ?ECG Heart Rate 84 82 85  ?Resp '14 16 17  '$ ?During Hemodialysis Assessment  ?Blood Flow Rate (mL/min) 250 mL/min 250 mL/min 250 mL/min  ?Arterial Pressure (mmHg) -90 mmHg -90 mmHg -100 mmHg  ?Venous Pressure (mmHg) 70 mmHg 70 mmHg 70 mmHg  ?Transmembrane Pressure (mmHg) 30 mmHg 30 mmHg 20 mmHg  ?Ultrafiltration Rate (mL/min) 200 mL/min 200 mL/min 200 mL/min  ?Dialysate Flow Rate (mL/min) 300 ml/min 300 ml/min 300 ml/min  ?Conductivity: Machine  13.9 13.9 13.9  ?HD Safety Checks Performed Yes Yes Yes  ?Intra-Hemodialysis Comments Tx initiated Progressing as prescribed;Tolerated well Progressing as prescribed  ? ? 01/24/22 1145 01/24/22 1200 01/24/22 1215  ?Vitals  ?BP 121/69 125/84 (!) 145/80  ?MAP (mmHg) 83 97 98  ?Pulse Rate 89 88 85  ?ECG Heart Rate 100 91 87  ?Resp '17 10 17  '$ ?During Hemodialysis Assessment  ?Blood Flow Rate (mL/min) 250 mL/min 250 mL/min 250 mL/min  ?Arterial Pressure (mmHg) -100 mmHg -100 mmHg -100 mmHg  ?Venous Pressure (mmHg) 80 mmHg 80 mmHg 80 mmHg  ?Transmembrane Pressure (mmHg) 20 mmHg 30 mmHg 20 mmHg  ?Ultrafiltration Rate (mL/min) 200 mL/min 200 mL/min 200 mL/min  ?Dialysate Flow Rate (mL/min) 300 ml/min 300 ml/min 300 ml/min  ?Conductivity: Machine  13.9 13.9 13.9  ?HD Safety Checks Performed Yes Yes Yes  ?Intra-Hemodialysis Comments Progressing as prescribed Progressing as prescribed Progressing as prescribed;Tolerated well  ? ?

## 2022-01-26 NOTE — Plan of Care (Signed)

## 2022-01-26 NOTE — Progress Notes (Signed)
Following for new hemodialysis placement. ?

## 2022-01-26 NOTE — Progress Notes (Signed)
PT Cancellation Note ? ?Patient Details ?Name: Anita Greene ?MRN: [id number] ?DOB: Aug 31, 1951 ? ? ?Cancelled Treatment:     Pt was unavailable due to receiving dialysis upon attempt. Will continue PT next available time/date. ? ? ?[name] ?01/26/2022, 1:24 PM ?

## 2022-01-26 NOTE — Progress Notes (Signed)
?   01/23/22 1300 01/23/22 1315 01/23/22 1330  ?Vitals  ?BP 125/72 (!) 137/119 117/68  ?MAP (mmHg) 87 127 81  ?Pulse Rate 85 (!) 101 86  ?ECG Heart Rate 85 100 86  ?Resp 16 (!) 28 (!) 24  ?During Hemodialysis Assessment  ?Blood Flow Rate (mL/min) 200 mL/min 200 mL/min 200 mL/min  ?Arterial Pressure (mmHg) -80 mmHg -80 mmHg -80 mmHg  ?Venous Pressure (mmHg) 50 mmHg 50 mmHg 60 mmHg  ?Transmembrane Pressure (mmHg) 20 mmHg 20 mmHg 20 mmHg  ?Ultrafiltration Rate (mL/min) 250 mL/min 250 mL/min 260 mL/min  ?Dialysate Flow Rate (mL/min) 300 ml/min 300 ml/min 300 ml/min  ?Conductivity: Machine  13.6 13.6 13.6  ?HD Safety Checks Performed Yes Yes Yes  ?Intra-Hemodialysis Comments Progressing as prescribed;Tolerated well Progressing as prescribed Progressing as prescribed  ? ? 01/23/22 1345 01/23/22 1400 01/23/22 1415  ?Vitals  ?BP 133/76 137/83 138/63  ?MAP (mmHg) 91 97 85  ?Pulse Rate 87 87 93  ?ECG Heart Rate 87 90 93  ?Resp '20 18 20  '$ ?During Hemodialysis Assessment  ?Blood Flow Rate (mL/min) 200 mL/min 200 mL/min 200 mL/min  ?Arterial Pressure (mmHg) -80 mmHg -80 mmHg -80 mmHg  ?Venous Pressure (mmHg) 50 mmHg 50 mmHg 50 mmHg  ?Transmembrane Pressure (mmHg) 20 mmHg 20 mmHg 20 mmHg  ?Ultrafiltration Rate (mL/min) 260 mL/min 260 mL/min 260 mL/min  ?Dialysate Flow Rate (mL/min) 300 ml/min 300 ml/min 300 ml/min  ?Conductivity: Machine  13.6 13.6 13.6  ?HD Safety Checks Performed Yes Yes Yes  ?Intra-Hemodialysis Comments Progressing as prescribed;Tolerated well Progressing as prescribed;Tolerated well Progressing as prescribed;Tolerated well  ? ?

## 2022-01-26 NOTE — Progress Notes (Signed)
?Progress Note ? ? ? ?Anita Greene   ?[id number]  ?DOB: 09/19/1951  ?DOA: 01/21/2022     5 ?PCP: Anita Pink, MD ? ?Initial CC: weakness, shaking ? ?Hospital Course: ?Anita Greene is a 71 yo female with PMH CKD3, GERD, B12 deficiency, HTN, IDA, OSA, RLS, CVA, DMII who presented with progressively worsening fatigue/malaise, lower extremity weakness, and hand tremors.  She is followed closely outpatient by nephrology and appears to have had rapidly progressive worsening renal function.  Per nephrology, renal biopsy October 2022 showed arterionephrosclerosis, severe global glomerulosclerosis, interstitial fibrosis. ?She was admitted to have dialysis initiated inpatient.  She underwent PermCath placement followed by initial dialysis session on 01/23/2022. ? ?Interval History:  ?No events overnight.  Resting in bed comfortably this morning.  Planning for undergoing dialysis today.  She understands plan is for rehab and outpatient HD. ? ?Assessment and Plan: ?* Acute renal failure superimposed on stage 3b chronic kidney disease ([place]) ?- Rapidly progressive renal failure; she is followed closely outpatient by nephrology.  Suspicion is that she has progressed to ESRD ?- Patient presented with weakness/fatigue and UE tremor ?- labs show rapid progression of renal failure; appreciate nephrology evaluation ?- s/p RIJ permcath placed by vascular surgery on 3/31 ?- 1st HD session 3/31 ?- outpatient plan is for traditional HD, she will need an HD chair secured prior to being able to d/c home ?-Undergoing third session of dialysis today, 01/26/2022 ? ?Anemia of chronic disease ?- Likely mixed etiology with history of iron deficiency anemia and worsening renal function ?- Hgb 6.3 g/dL on 3/30, s/p 1 unit PRBC ?- Iron studies on admission considered adequate ?-Continue trending CBC ? ?Physical deconditioning ?- suspected due to renal failure ?- CT C/L spine on admission: mild/mod DDD in C spine. No acute findings in L spine. CTH  showed patchy areas of chronic small vessel disease  ?- monitor response after HD started: Does note improvement in her weakness ?- Continue PT while hospitalized; currently SNF recommended and patient/son want her to go to rehab as well; this might affect timing/location with HD, will discuss with CM and staff on Monday ? ?HTN (hypertension) ?- continue midodrine PRN ?-Verapamil and lisinopril/hctz on hold ? ?Hypomagnesemia ?- Replete as needed ? ?History of CVA (cerebrovascular accident) ?- continue asa ? ?GERD (gastroesophageal reflux disease) ?- Continue PPI ? ?Obstructive sleep apnea ?- Continue nightly CPAP ? ?Mood disorder ([place]) ?- Continue Wellbutrin, BuSpar, Cymbalta ? ?Type 2 diabetes mellitus with neurological manifestations, controlled ([place]) ?- continue SSI and CBG monitoring  ? ? ?Old records reviewed in assessment of this patient ? ?Antimicrobials: ? ? ?DVT prophylaxis:  ?heparin injection 5,000 Units Start: 01/25/22 2200 ?SCDs Start: 01/21/22 1612 ? ? ?Code Status:   Code Status: Full Code ? ?Disposition Plan:  Tentative to SNF.  Needs outpatient HD set up as well ?Status is: Inpt ? ?Objective: ?Blood pressure 115/69, pulse 94, temperature 98.8 ?F (37.1 ?C), temperature source Oral, resp. rate 16, height '5\' 1"'$  (1.549 m), weight 60.2 kg, SpO2 98 %.  ?Examination:  ?Physical Exam ?Constitutional:   ?   Appearance: Normal appearance.  ?HENT:  ?   Head: Normocephalic and atraumatic.  ?   Mouth/Throat:  ?   Mouth: Mucous membranes are moist.  ?Eyes:  ?   Extraocular Movements: Extraocular movements intact.  ?Neck:  ?   Comments: RIJ perm cath in place ?Cardiovascular:  ?   Rate and Rhythm: Normal rate and regular rhythm.  ?Pulmonary:  ?  Effort: Pulmonary effort is normal.  ?   Breath sounds: Normal breath sounds.  ?Abdominal:  ?   General: Bowel sounds are normal. There is no distension.  ?   Palpations: Abdomen is soft.  ?   Tenderness: There is no abdominal tenderness.  ?Musculoskeletal:     ?    General: No swelling. Normal range of motion.  ?   Cervical back: Normal range of motion and neck supple.  ?Skin: ?   General: Skin is warm and dry.  ?Neurological:  ?   Mental Status: She is alert.  ?   Comments: No further tremor appreciated   ?Psychiatric:     ?   Mood and Affect: Mood normal.     ?   Behavior: Behavior normal.  ?  ? ?Consultants:  ?Nephrology ?Vascular surgery ? ?Procedures:  ?Permcath placement, 01/23/22 ? ?Data Reviewed: ?Results for orders placed or performed during the hospital encounter of 01/21/22 (from the past 24 hour(s))  ?CBC with Differential/Platelet     Status: Abnormal  ? Collection Time: 01/26/22  6:51 AM  ?Result Value Ref Range  ? WBC 11.6 (H) 4.0 - 10.5 K/uL  ? RBC 2.64 (L) 3.87 - 5.11 MIL/uL  ? Hemoglobin 7.7 (L) 12.0 - 15.0 g/dL  ? HCT 24.4 (L) 36.0 - 46.0 %  ? MCV 92.4 80.0 - 100.0 fL  ? MCH 29.2 26.0 - 34.0 pg  ? MCHC 31.6 30.0 - 36.0 g/dL  ? RDW 14.6 11.5 - 15.5 %  ? Platelets 272 150 - 400 K/uL  ? nRBC 0.0 0.0 - 0.2 %  ? Neutrophils Relative % 60 %  ? Neutro Abs 6.9 1.7 - 7.7 K/uL  ? Lymphocytes Relative 30 %  ? Lymphs Abs 3.5 0.7 - 4.0 K/uL  ? Monocytes Relative 7 %  ? Monocytes Absolute 0.9 0.1 - 1.0 K/uL  ? Eosinophils Relative 2 %  ? Eosinophils Absolute 0.2 0.0 - 0.5 K/uL  ? Basophils Relative 0 %  ? Basophils Absolute 0.0 0.0 - 0.1 K/uL  ? Immature Granulocytes 1 %  ? Abs Immature Granulocytes 0.10 (H) 0.00 - 0.07 K/uL  ?Magnesium     Status: None  ? Collection Time: 01/26/22  6:51 AM  ?Result Value Ref Range  ? Magnesium 1.8 1.7 - 2.4 mg/dL  ?Renal function panel     Status: Abnormal  ? Collection Time: 01/26/22  6:51 AM  ?Result Value Ref Range  ? Sodium 135 135 - 145 mmol/L  ? Potassium 3.6 3.5 - 5.1 mmol/L  ? Chloride 99 98 - 111 mmol/L  ? CO2 26 22 - 32 mmol/L  ? Glucose, Bld 93 70 - 99 mg/dL  ? BUN 37 (H) 8 - 23 mg/dL  ? Creatinine, Ser 5.57 (H) 0.44 - 1.00 mg/dL  ? Calcium 8.7 (L) 8.9 - 10.3 mg/dL  ? Phosphorus 3.4 2.5 - 4.6 mg/dL  ? Albumin 3.1 (L) 3.5 - 5.0  g/dL  ? GFR, Estimated 8 (L) >60 mL/min  ? Anion gap 10 5 - 15  ?  ?I have Reviewed nursing notes, Vitals, and Lab results since pt's last encounter. Pertinent lab results : see above ?I have ordered test including BMP, CBC, Mg ?I have reviewed the last note from staff over past 24 hours ?I have discussed pt's care plan and test results with nursing staff, case manager ? ? LOS: 5 days  ? ?Dwyane Dee, MD ?[facility] ?01/26/2022, 11:19 AM ? ?

## 2022-01-26 NOTE — Care Management Important Message (Signed)
Important Message ? ?Patient Details  ?Name: Anita Greene ?MRN: [id number] ?Date of Birth: October 20, 1951 ? ? ?Medicare Important Message Given:  Yes ? ? ? ? ?Juliann Pulse A Kaya Pottenger ?01/26/2022, 3:25 PM ?

## 2022-01-26 NOTE — Progress Notes (Signed)
?   01/23/22 1202 01/23/22 1215 01/23/22 1230  ?Vitals  ?BP  --  (!) 141/73  --   ?MAP (mmHg)  --  79  --   ?Pulse Rate 79 78 80  ?ECG Heart Rate 80 79 81  ?Resp '13 15 20  '$ ?During Hemodialysis Assessment  ?Blood Flow Rate (mL/min) 200 mL/min 200 mL/min 200 mL/min  ?Arterial Pressure (mmHg) -70 mmHg -90 mmHg -90 mmHg  ?Venous Pressure (mmHg) 50 mmHg 60 mmHg 50 mmHg  ?Transmembrane Pressure (mmHg) 30 mmHg 40 mmHg 30 mmHg  ?Ultrafiltration Rate (mL/min) 250 mL/min 250 mL/min 250 mL/min  ?Dialysate Flow Rate (mL/min) 300 ml/min 300 ml/min 300 ml/min  ?Conductivity: Machine  13.6 13.6 13.6  ?HD Safety Checks Performed Yes Yes Yes  ?Intra-Hemodialysis Comments Progressing as prescribed;Tolerated well Progressing as prescribed;Tolerated well Progressing as prescribed;Tolerated well  ? ? 01/23/22 1234 01/23/22 1235 01/23/22 1245  ?Vitals  ?BP (!) 84/64 125/72 105/65  ?MAP (mmHg) 73 87 77  ?Pulse Rate 81 85 87  ?ECG Heart Rate 82 85 86  ?Resp '14 18 16  '$ ?During Hemodialysis Assessment  ?Blood Flow Rate (mL/min)  --   --  200 mL/min  ?Arterial Pressure (mmHg)  --   --  -80 mmHg  ?Venous Pressure (mmHg)  --   --  50 mmHg  ?Transmembrane Pressure (mmHg)  --   --  30 mmHg  ?Ultrafiltration Rate (mL/min)  --   --  250 mL/min  ?Dialysate Flow Rate (mL/min)  --   --  300 ml/min  ?Conductivity: Machine   --   --  13.6  ?HD Safety Checks Performed  --   --  Yes  ?Intra-Hemodialysis Comments  --   --  Progressing as prescribed;Tolerated well  ? ?

## 2022-01-26 NOTE — Progress Notes (Signed)
?   01/26/22 0919 01/26/22 0930 01/26/22 0945  ?Vitals  ?BP (!) 144/87 (!) 91/59 (!) 127/108  ?MAP (mmHg) 105 68 116  ?Pulse Rate 93 (!) 108 (!) 102  ?ECG Heart Rate 94 (!) 110 (!) 101  ?Resp  --  20 17  ?During Hemodialysis Assessment  ?Blood Flow Rate (mL/min) 300 mL/min 300 mL/min 300 mL/min  ?Arterial Pressure (mmHg) -110 mmHg -110 mmHg -110 mmHg  ?Venous Pressure (mmHg) 90 mmHg 90 mmHg 80 mmHg  ?Transmembrane Pressure (mmHg) 60 mmHg 50 mmHg 50 mmHg  ?Ultrafiltration Rate (mL/min) 500 mL/min 500 mL/min 500 mL/min  ?Dialysate Flow Rate (mL/min) 500 ml/min 500 ml/min 500 ml/min  ?Conductivity: Machine  13.9 13.9 14  ?HD Safety Checks Performed Yes Yes Yes  ?Dialysis Fluid Bolus Normal Saline  --   --   ?Bolus Amount (mL) 250 mL  --   --   ?Dialysate Change  --   --  Other (comment) ?(3.0k 2.5ca)  ?Intra-Hemodialysis Comments Tx initiated Progressing as prescribed Progressing as prescribed  ? ? 01/26/22 1000 01/26/22 1015 01/26/22 1030  ?Vitals  ?BP 105/67 120/68 119/67  ?MAP (mmHg) 79 83 81  ?Pulse Rate 98 99 99  ?ECG Heart Rate 99 99 99  ?Resp '16 19 14  '$ ?During Hemodialysis Assessment  ?Blood Flow Rate (mL/min) 300 mL/min 300 mL/min 300 mL/min  ?Arterial Pressure (mmHg) -120 mmHg -120 mmHg -120 mmHg  ?Venous Pressure (mmHg) 90 mmHg 90 mmHg 90 mmHg  ?Transmembrane Pressure (mmHg) 50 mmHg 50 mmHg 50 mmHg  ?Ultrafiltration Rate (mL/min) 500 mL/min 500 mL/min 240 mL/min ?(goal decreased 1000 per NP)  ?Dialysate Flow Rate (mL/min) 500 ml/min 500 ml/min 500 ml/min  ?Conductivity: Machine  13.6 13.7 13.9  ?HD Safety Checks Performed Yes Yes Yes  ?Dialysis Fluid Bolus  --   --   --   ?Bolus Amount (mL)  --   --   --   ?Dialysate Change  --   --   --   ?Intra-Hemodialysis Comments Progressing as prescribed Progressing as prescribed Progressing as prescribed  ? ? 01/26/22 1045  ?Vitals  ?BP 127/74  ?MAP (mmHg) 86  ?Pulse Rate 95  ?ECG Heart Rate 95  ?Resp 16  ?During Hemodialysis Assessment  ?Blood Flow Rate (mL/min) 300  mL/min  ?Arterial Pressure (mmHg) -120 mmHg  ?Venous Pressure (mmHg) 90 mmHg  ?Transmembrane Pressure (mmHg) 50 mmHg  ?Ultrafiltration Rate (mL/min) 240 mL/min  ?Dialysate Flow Rate (mL/min) 500 ml/min  ?Conductivity: Machine  13.9  ?HD Safety Checks Performed Yes  ?Dialysis Fluid Bolus  --   ?Bolus Amount (mL)  --   ?Dialysate Change  --   ?Intra-Hemodialysis Comments Progressing as prescribed  ? ?

## 2022-01-26 NOTE — Progress Notes (Signed)
?   01/24/22 1230 01/24/22 1245 01/24/22 1300  ?Vitals  ?BP 125/77 124/75 (!) 146/69  ?MAP (mmHg) 91 89 92  ?Pulse Rate 87 88 87  ?ECG Heart Rate 90 87 99  ?Resp '18 15 18  '$ ?During Hemodialysis Assessment  ?Blood Flow Rate (mL/min) 250 mL/min 250 mL/min 250 mL/min  ?Arterial Pressure (mmHg) 0 mmHg -100 mmHg -110 mmHg  ?Venous Pressure (mmHg) 80 mmHg 80 mmHg 70 mmHg  ?Transmembrane Pressure (mmHg) 20 mmHg 30 mmHg 30 mmHg  ?Ultrafiltration Rate (mL/min) 200 mL/min 200 mL/min 200 mL/min  ?Dialysate Flow Rate (mL/min) 300 ml/min 300 ml/min 300 ml/min  ?Conductivity: Machine  13.8 13.8 13.9  ?HD Safety Checks Performed Yes Yes Yes  ?Intra-Hemodialysis Comments Progressing as prescribed Progressing as prescribed;Tolerated well Progressing as prescribed  ? ? 01/24/22 1315 01/24/22 1330 01/24/22 1334  ?Vitals  ?BP 133/71 130/70  --   ?MAP (mmHg) 86 89  --   ?Pulse Rate 86 88 86  ?ECG Heart Rate 91 89 87  ?Resp '11 12 15  '$ ?During Hemodialysis Assessment  ?Blood Flow Rate (mL/min) 250 mL/min 250 mL/min  --   ?Arterial Pressure (mmHg) -100 mmHg -100 mmHg  --   ?Venous Pressure (mmHg) 80 mmHg 80 mmHg  --   ?Transmembrane Pressure (mmHg) 20 mmHg 30 mmHg  --   ?Ultrafiltration Rate (mL/min) 200 mL/min 200 mL/min  --   ?Dialysate Flow Rate (mL/min) 300 ml/min 300 ml/min  --   ?Conductivity: Machine  13.9 13.9  --   ?HD Safety Checks Performed Yes Yes  --   ?Intra-Hemodialysis Comments Progressing as prescribed Progressing as prescribed;Tolerated well Tx completed  ? ?

## 2022-01-26 NOTE — Progress Notes (Signed)
Hemodialysis Post Treatment Note ? ?26 January 2022 ? ?RI Tunneled Catheter ? ?500 ml Fluid Removal ? ?Note: ? ?Patient presents for 3 hour hemodialysis treatment, third session of a new patient start. Patient tolerates treatment without incident, meeting goals. Catheter functions within set parameters. Tolerates fluid removal of 500 ml.. VSS, afebrile, no drsg change. Patient offers up no concern of pain or discomfort. Report given, patient returns to room.  ? ? ?

## 2022-01-26 NOTE — Progress Notes (Signed)
[facility] ?[place], [place] ?01/26/22 ? ?Subjective:  ? ?Hospital day # 5 ? ?Patient known to our practice from outpatient follow-up. ?She presents to the emergency room for not feeling well for the past 2 to 3 weeks.  She had lower extremity weakness and states that she was not able to walk even to 3 steps. ? ?Update: ?Patient seen and evaluated during dialysis ?  ?HEMODIALYSIS FLOWSHEET: ? ?Blood Flow Rate (mL/min): 300 mL/min ?Arterial Pressure (mmHg): -120 mmHg ?Venous Pressure (mmHg): 110 mmHg ?Transmembrane Pressure (mmHg): 50 mmHg ?Ultrafiltration Rate (mL/min): 240 mL/min ?Dialysate Flow Rate (mL/min): 500 ml/min ?Conductivity: Machine : 14 ?Conductivity: Machine : 14 ?Dialysis Fluid Bolus: Normal Saline ?Bolus Amount (mL): 250 mL ?Dialysate Change: Other (comment) (3.0k 2.5ca) ? ?No complaints at this time ?Denies pain and discomfort ?Tolerating meals ? ?Objective:  ?Vital signs in last 24 hours:  ?Temp:  [98.3 ?F (36.8 ?C)-98.9 ?F (37.2 ?C)] 98.6 ?F (37 ?C) (04/03 1324) ?Pulse Rate:  [80-108] 93 (04/03 1324) ?Resp:  [11-26] 16 (04/03 1324) ?BP: (91-169)/(59-108) 116/88 (04/03 1324) ?SpO2:  [96 %-100 %] 100 % (04/03 1324) ?Weight:  [59.2 kg-60.2 kg] 59.2 kg (04/03 1222) ? ?Weight change: 2.176 kg ?Filed Weights  ? 01/26/22 1027 01/26/22 0852 01/26/22 1222  ?Weight: 59.4 kg 60.2 kg 59.2 kg  ? ? ?Intake/Output: ?  ? ?Intake/Output Summary (Last 24 hours) at 01/26/2022 1429 ?Last data filed at 01/26/2022 1329 ?Gross per 24 hour  ?Intake --  ?Output 950 ml  ?Net -950 ml  ? ? ? ?Physical Exam: ?General:  No acute distress, laying in the bed  ?HEENT  anicteric, moist oral mucous membrane  ?Pulm/lungs  normal breathing effort, lungs are clear to auscultation  ?CVS/Heart  regular rhythm, no rub or gallop  ?Abdomen:   Soft, nontender  ?Extremities:  No peripheral edema  ?Neurologic:  Alert, oriented, able to follow commands  ?Skin:  No acute rashes  ?Access: Rt Permcath ? ?Basic Metabolic  Panel: ? ?Recent Labs  ?Lab 01/22/22 ?2536 01/23/22 ?6440 01/24/22 ?3474 01/25/22 ?0510 01/26/22 ?2595  ?NA 137 137 136 138 135  ?K 3.8 3.2* 3.4* 3.2* 3.6  ?CL 105 105 99 100 99  ?CO2 18* 16* '25 28 26  '$ ?GLUCOSE 131* 85 96 119* 93  ?BUN 99* 93* 58* 32* 37*  ?CREATININE 9.46* 9.05* 5.78* 4.05* 5.57*  ?CALCIUM 7.2* 6.7* 7.9* 8.6* 8.7*  ?MG 1.8 1.6* 1.7 1.8 1.8  ?PHOS 9.0* 8.3* 5.7* 4.2 3.4  ? ? ? ? ?CBC: ?Recent Labs  ?Lab 01/22/22 ?6387 01/23/22 ?5643 01/24/22 ?3295 01/25/22 ?0510 01/26/22 ?1884  ?WBC 9.0 9.2 10.0 11.6* 11.6*  ?NEUTROABS 6.2 6.0 6.6 7.5 6.9  ?HGB 6.3* 8.2* 9.1* 9.0* 7.7*  ?HCT 19.0* 23.7* 26.3* 27.1* 24.4*  ?MCV 89.6 86.5 87.1 88.0 92.4  ?PLT 303 265 275 269 272  ? ? ? ?  ?Lab Results  ?Component Value Date  ? HEPBSAG NON REACTIVE 01/23/2022  ? HEPBSAB NON REACTIVE 01/23/2022  ? ? ? ? ?Microbiology: ? ?Recent Results (from the past 240 hour(s))  ?Resp Panel by RT-PCR (Flu A&B, Covid) Nasopharyngeal Swab     Status: None  ? Collection Time: 01/21/22  3:47 PM  ? Specimen: Nasopharyngeal Swab; Nasopharyngeal(NP) swabs in vial transport medium  ?Result Value Ref Range Status  ? SARS Coronavirus 2 by RT PCR NEGATIVE NEGATIVE Final  ?  Comment: (NOTE) ?SARS-CoV-2 target nucleic acids are NOT DETECTED. ? ?The SARS-CoV-2 RNA is generally detectable in upper respiratory ?specimens  during the acute phase of infection. The lowest ?concentration of SARS-CoV-2 viral copies this assay can detect is ?138 copies/mL. A negative result does not preclude SARS-Cov-2 ?infection and should not be used as the sole basis for treatment or ?other patient management decisions. A negative result may occur with  ?improper specimen collection/handling, submission of specimen other ?than nasopharyngeal swab, presence of viral mutation(s) within the ?areas targeted by this assay, and inadequate number of viral ?copies(<138 copies/mL). A negative result must be combined with ?clinical observations, patient history, and  epidemiological ?information. The expected result is Negative. ? ?Fact Sheet for Patients:  ?EntrepreneurPulse.com.au ? ?Fact Sheet for Healthcare Providers:  ?IncredibleEmployment.be ? ?This test is no t yet approved or cleared by the [place] FDA and  ?has been authorized for detection and/or diagnosis of SARS-CoV-2 by ?FDA under an Emergency Use Authorization (EUA). This EUA will remain  ?in effect (meaning this test can be used) for the duration of the ?COVID-19 declaration under Section 564(b)(1) of the Act, 21 ?U.S.C.section 360bbb-3(b)(1), unless the authorization is terminated  ?or revoked sooner.  ? ? ?  ? Influenza A by PCR NEGATIVE NEGATIVE Final  ? Influenza B by PCR NEGATIVE NEGATIVE Final  ?  Comment: (NOTE) ?The Xpert Xpress SARS-CoV-2/FLU/RSV plus assay is intended as an aid ?in the diagnosis of influenza from Nasopharyngeal swab specimens and ?should not be used as a sole basis for treatment. Nasal washings and ?aspirates are unacceptable for Xpert Xpress SARS-CoV-2/FLU/RSV ?testing. ? ?Fact Sheet for Patients: ?EntrepreneurPulse.com.au ? ?Fact Sheet for Healthcare Providers: ?IncredibleEmployment.be ? ?This test is not yet approved or cleared by the [place] FDA and ?has been authorized for detection and/or diagnosis of SARS-CoV-2 by ?FDA under an Emergency Use Authorization (EUA). This EUA will remain ?in effect (meaning this test can be used) for the duration of the ?COVID-19 declaration under Section 564(b)(1) of the Act, 21 U.S.C. ?section 360bbb-3(b)(1), unless the authorization is terminated or ?revoked. ? ?Performed at [facility], [place], ?[place] [address] ?  ? ? ?Coagulation Studies: ?No results for input(s): LABPROT, INR in the last 72 hours. ? ?Urinalysis: ?No results for input(s): COLORURINE, LABSPEC, [place], GLUCOSEU, HGBUR, BILIRUBINUR, KETONESUR, PROTEINUR, UROBILINOGEN,  NITRITE, LEUKOCYTESUR in the last 72 hours. ? ?Invalid input(s): APPERANCEUR ?  ? ? ?Imaging: ?No results found. ? ? ?Medications:  ? ? sodium chloride    ? sodium chloride    ? ? aspirin EC  81 mg Oral QHS  ? buPROPion  300 mg Oral Daily  ? busPIRone  15 mg Oral TID  ? calcium acetate  1,334 mg Oral TID WC  ? Chlorhexidine Gluconate Cloth  6 each Topical Q0600  ? darbepoetin (ARANESP) injection - NON-DIALYSIS  100 mcg Subcutaneous Q Thu-1800  ? DULoxetine  30 mg Oral Daily  ? feeding supplement  237 mL Oral BID BM  ? ferrous sulfate  325 mg Oral BID WC  ? heparin injection (subcutaneous)  5,000 Units Subcutaneous Q12H  ? heparin sodium (porcine)      ? sodium chloride flush  3 mL Intravenous Q12H  ? ?sodium chloride, sodium chloride, acetaminophen **OR** acetaminophen, albuterol, alteplase, heparin, lidocaine (PF), lidocaine-prilocaine, midodrine, pentafluoroprop-tetrafluoroeth, zolpidem ? ?Assessment/ Plan:  ?71 y.o. female with fibromyalgia, chronic low back pain, history of anxiety and depression, history of stroke in 2009, diabetes 2009, obstructive sleep apnea, right hearing loss, history of kidney stones, frequent UTIs, history of bladder tack, history of nonsteroidal use-meloxicam, gastric bypass surgery in  June 2020    admitted on 01/21/2022 for ?Weakness [R53.1] ?Acute renal failure (ARF) (HCC) [N17.9] ?Acute renal failure superimposed on chronic kidney disease, unspecified CKD stage, unspecified acute renal failure type ([place]) [N17.9, N18.9] ? ?#End-stage renal disease with hypokalemia ?Patient's renal failure has progressed to end-stage renal disease. ?CKD risk factors include diabetes, hypertension, kidney stones, frequent UTIs, history of nonsteroidal use, IV contrast exposure, atherosclerosis and advanced age. ?Patient had a renal biopsy in October 2022 which was consistent with arterionephrosclerosis, severe global glomerulosclerosis, interstitial fibrosis all features consistent with diabetic,  sclerosis.  Calcium oxalate crystals were also noted. ?-Rt permcath placed by vascular on 01/23/22. Dialysis initiated after placement.  ? ?Plan ?-Dialysis received today, UF goal 518m achieved. Next treatment scheduled for Wednesday s

## 2022-01-26 NOTE — TOC Progression Note (Signed)
Transition of Care (TOC) - Progression Note  ? ? ?Patient Details  ?Name: Anita Greene ?MRN: [id number] ?Date of Birth: 1951/02/05 ? ?Transition of Care (TOC) CM/SW Contact  ?Conception Oms, RN ?Phone Number: ?01/26/2022, 1:53 PM ? ?Clinical Narrative:   reviewed bed offers and the patient chose [facility], [place] called to start Ins approval ? ? ? ?  ?  ? ?Expected Discharge Plan and Services ?  ?  ?  ?  ?  ?                ?  ?  ?  ?  ?  ?  ?  ?  ?  ?  ? ? ?Social Determinants of Health (SDOH) Interventions ?  ? ?Readmission Risk Interventions ?   ? View : No data to display.  ?  ?  ?  ? ? ?

## 2022-01-27 DIAGNOSIS — N186 End stage renal disease: Secondary | ICD-10-CM

## 2022-01-27 DIAGNOSIS — Z8673 Personal history of transient ischemic attack (TIA), and cerebral infarction without residual deficits: Secondary | ICD-10-CM | POA: Diagnosis not present

## 2022-01-27 DIAGNOSIS — G4733 Obstructive sleep apnea (adult) (pediatric): Secondary | ICD-10-CM | POA: Diagnosis not present

## 2022-01-27 DIAGNOSIS — D638 Anemia in other chronic diseases classified elsewhere: Secondary | ICD-10-CM | POA: Diagnosis not present

## 2022-01-27 LAB — RENAL FUNCTION PANEL
Albumin: 3.3 g/dL — ABNORMAL LOW (ref 3.5–5.0)
Anion gap: 13 (ref 5–15)
BUN: 19 mg/dL (ref 8–23)
CO2: 27 mmol/L (ref 22–32)
Calcium: 9 mg/dL (ref 8.9–10.3)
Chloride: 97 mmol/L — ABNORMAL LOW (ref 98–111)
Creatinine, Ser: 3.58 mg/dL — ABNORMAL HIGH (ref 0.44–1.00)
GFR, Estimated: 13 mL/min — ABNORMAL LOW (ref >60)
Glucose, Bld: 97 mg/dL (ref 70–99)
Phosphorus: 2.6 mg/dL (ref 2.5–4.6)
Potassium: 4.1 mmol/L (ref 3.5–5.1)
Sodium: 137 mmol/L (ref 135–145)

## 2022-01-27 LAB — CBC WITH DIFFERENTIAL/PLATELET
Abs Immature Granulocytes: 0.09 10*3/uL — ABNORMAL HIGH (ref 0.00–0.07)
Basophils Absolute: 0.1 10*3/uL (ref 0.0–0.1)
Basophils Relative: 1 %
Eosinophils Absolute: 0.2 10*3/uL (ref 0.0–0.5)
Eosinophils Relative: 2 %
HCT: 29 % — ABNORMAL LOW (ref 36.0–46.0)
Hemoglobin: 9.5 g/dL — ABNORMAL LOW (ref 12.0–15.0)
Immature Granulocytes: 1 %
Lymphocytes Relative: 28 %
Lymphs Abs: 3.1 10*3/uL (ref 0.7–4.0)
MCH: 30.1 pg (ref 26.0–34.0)
MCHC: 32.8 g/dL (ref 30.0–36.0)
MCV: 91.8 fL (ref 80.0–100.0)
Monocytes Absolute: 1 10*3/uL (ref 0.1–1.0)
Monocytes Relative: 9 %
Neutro Abs: 6.8 10*3/uL (ref 1.7–7.7)
Neutrophils Relative %: 59 %
Platelets: 261 10*3/uL (ref 150–400)
RBC: 3.16 MIL/uL — ABNORMAL LOW (ref 3.87–5.11)
RDW: 14.2 % (ref 11.5–15.5)
WBC: 11.3 10*3/uL — ABNORMAL HIGH (ref 4.0–10.5)
nRBC: 0 % (ref 0.0–0.2)

## 2022-01-27 LAB — MAGNESIUM: Magnesium: 1.9 mg/dL (ref 1.7–2.4)

## 2022-01-27 MED ORDER — ONDANSETRON 4 MG PO TBDP
4.0000 mg | ORAL_TABLET | Freq: Once | ORAL | Status: AC
  Administered 2022-01-27: 4 mg via ORAL
  Filled 2022-01-27: qty 1

## 2022-01-27 NOTE — Progress Notes (Signed)
Records are being reviewed by [place] ([place]).  ?

## 2022-01-27 NOTE — Progress Notes (Signed)
Physical Therapy Treatment ?Patient Details ?Name: Anita Greene ?MRN: [id number] ?DOB: Sep 24, 1951 ?Today's Date: 01/27/2022 ? ? ?History of Present Illness Anita Greene is a 71 y.o. female   with past medical history of low back pain, fibromyalgia, diabetes, CKD  nephritis who follows with nephrology, frequent UTIs who presents with generalized weakness and worsening kidney function. ? ?  ?PT Comments  ? ? Pt received seated in recliner upon arrival to room and pt agreeable to therapy.  Pt requesting that she needs to go to the bathroom as she feels as though she has soiled herself.  Pt ablet o transfer to the bathroom with FWW and CGA and transition to the commode.  Nursing assisted with self care and donning of new linens.  Pt then transitioned to ambulating around the nursing station with much more control and no LOB noticed with ambulation.  Pt then returned to recliner where she was left with all needs met and call bell within reach.  Due to lack of caregiver support, pt would benefit from being transitioned to SNF for increasing functional goals.  Current discharge plans to SNF remain appropriate at this time.  Pt will continue to benefit from skilled therapy in order to address deficits listed below. ? ? ?   ?Recommendations for follow up therapy are one component of a multi-disciplinary discharge planning process, led by the attending physician.  Recommendations may be updated based on patient status, additional functional criteria and insurance authorization. ? ?Follow Up Recommendations ? Skilled nursing-short term rehab (<3 hours/day) ?  ?  ?Assistance Recommended at Discharge Frequent or constant Supervision/Assistance  ?Patient can return home with the following A little help with walking and/or transfers;A little help with bathing/dressing/bathroom;Assistance with cooking/housework ?  ?Equipment Recommendations ? Rolling walker (2 wheels)  ?  ?Recommendations for Other Services   ? ? ?  ?Precautions  / Restrictions Precautions ?Precautions: Fall ?Restrictions ?Weight Bearing Restrictions: No  ?  ? ?Mobility ? Bed Mobility ?  ?  ?  ?  ?  ?  ?  ?General bed mobility comments: pt upright in recliner upon arrival and requested to return to recliner. ?  ? ?Transfers ?  ?Equipment used: Rolling walker (2 wheels) ?Transfers: Sit to/from Stand ?Sit to Stand: Min guard ?  ?  ?  ?  ?  ?General transfer comment: Much more stability today ?  ? ?Ambulation/Gait ?Ambulation/Gait assistance: Min guard ?Gait Distance (Feet): 160 Feet ?Assistive device: Rolling walker (2 wheels) ?Gait Pattern/deviations: Step-to pattern, Decreased stride length ?Gait velocity: decreased ?  ?  ?General Gait Details: Pt able to ambulate around the nursing station and back to the room. ? ? ?Stairs ?  ?  ?  ?  ?  ? ? ?Wheelchair Mobility ?  ? ?Modified Rankin (Stroke Patients Only) ?  ? ? ?  ?Balance Overall balance assessment: Needs assistance ?Sitting-balance support: No upper extremity supported, Feet supported ?Sitting balance-Leahy Scale: Fair ?  ?  ?Standing balance support: Bilateral upper extremity supported, During functional activity ?Standing balance-Leahy Scale: Fair ?Standing balance comment: Pt requires UE support on RW to keep balance in standing. ?  ?  ?  ?  ?  ?  ?  ?  ?  ?  ?  ?  ? ?  ?Cognition Arousal/Alertness: Awake/alert ?Behavior During Therapy: [facility] for tasks assessed/performed ?Overall Cognitive Status: Within Functional Limits for tasks assessed ?  ?  ?  ?  ?  ?  ?  ?  ?  ?  ?  ?  ?  ?  ?  ?  ?  ?  ?  ? ?  ?  Exercises   ? ?  ?General Comments   ?  ?  ? ?Pertinent Vitals/Pain Pain Assessment ?Pain Assessment: No/denies pain  ? ? ?Home Living   ?  ?  ?  ?  ?  ?  ?  ?  ?  ?   ?  ?Prior Function    ?  ?  ?   ? ?PT Goals (current goals can now be found in the care plan section) Acute Rehab PT Goals ?Patient Stated Goal: to get stronger and go home. ?PT Goal Formulation: With patient ?Time For Goal Achievement: 02/05/22 ?Potential  to Achieve Goals: Fair ?Progress towards PT goals: Progressing toward goals ? ?  ?Frequency ? ? ? Min 2X/week ? ? ? ?  ?PT Plan Current plan remains appropriate  ? ? ?Co-evaluation   ?  ?  ?  ?  ? ?  ?AM-PAC PT "6 Clicks" Mobility   ?Outcome Measure ? Help needed turning from your back to your side while in a flat bed without using bedrails?: A Little ?Help needed moving from lying on your back to sitting on the side of a flat bed without using bedrails?: A Little ?Help needed moving to and from a bed to a chair (including a wheelchair)?: A Little ?Help needed standing up from a chair using your arms (e.g., wheelchair or bedside chair)?: A Little ?Help needed to walk in hospital room?: A Little ?Help needed climbing 3-5 steps with a railing? : A Lot ?6 Click Score: 17 ? ?  ?End of Session Equipment Utilized During Treatment: Gait belt ?Activity Tolerance: Patient tolerated treatment well ?Patient left: in chair;with call bell/phone within reach;with chair alarm set ?Nurse Communication: Mobility status ?PT Visit Diagnosis: Unsteadiness on feet (R26.81);Other abnormalities of gait and mobility (R26.89);Repeated falls (R29.6);Muscle weakness (generalized) (M62.81);History of falling (Z91.81);Difficulty in walking, not elsewhere classified (R26.2) ?  ? ? ?Time: 0044-7158 ?PT Time Calculation (min) (ACUTE ONLY): 23 min ? ?Charges:  $Gait Training: 8-22 mins ?$Self Care/Home Management: 8-22          ?          ? ?Anita Greene, PT, DPT ?01/27/22, 10:42 AM ? ? ? ?Christie Nottingham ?01/27/2022, 10:40 AM ? ?

## 2022-01-27 NOTE — Progress Notes (Signed)
[facility] ?[place], [place] ?01/27/22 ? ?Subjective:  ? ?Hospital day # 6 ? ?Patient known to our practice from outpatient follow-up. ?She presents to the emergency room for not feeling well for the past 2 to 3 weeks.  She had lower extremity weakness and states that she was not able to walk even to 3 steps. ? ?Update: ? ?Patient resting comfortably  ?Tolerating meals without nausea and vomiting ?Remains on room air ?No lower extremity edema ? ? ?Objective:  ?Vital signs in last 24 hours:  ?Temp:  [98.3 ?F (36.8 ?C)-99.1 ?F (37.3 ?C)] 98.4 ?F (36.9 ?C) (04/04 1215) ?Pulse Rate:  [85-102] 102 (04/04 1215) ?Resp:  [11-20] 16 (04/04 1215) ?BP: (116-168)/(66-92) 135/82 (04/04 1215) ?SpO2:  [99 %-100 %] 100 % (04/04 1215) ?Weight:  [59.2 kg-62.5 kg] 62.5 kg (04/04 0500) ? ?Weight change: 0.824 kg ?Filed Weights  ? 01/26/22 0852 01/26/22 1222 01/27/22 0500  ?Weight: 60.2 kg 59.2 kg 62.5 kg  ? ? ?Intake/Output: ?  ? ?Intake/Output Summary (Last 24 hours) at 01/27/2022 1219 ?Last data filed at 01/27/2022 0815 ?Gross per 24 hour  ?Intake 120 ml  ?Output 850 ml  ?Net -730 ml  ? ? ? ?Physical Exam: ?General:  No acute distress, laying in the bed  ?HEENT  anicteric, moist oral mucous membrane  ?Pulm/lungs  normal breathing effort, lungs are clear to auscultation  ?CVS/Heart  regular rhythm, no rub or gallop  ?Abdomen:   Soft, nontender  ?Extremities:  No peripheral edema  ?Neurologic:  Alert, oriented, able to follow commands  ?Skin:  No acute rashes  ?Access: Rt Permcath ? ?Basic Metabolic Panel: ? ?Recent Labs  ?Lab 01/23/22 ?0628 01/24/22 ?9163 01/25/22 ?0510 01/26/22 ?8466 01/27/22 ?0400  ?NA 137 136 138 135 137  ?K 3.2* 3.4* 3.2* 3.6 4.1  ?CL 105 99 100 99 97*  ?CO2 16* '25 28 26 27  '$ ?GLUCOSE 85 96 119* 93 97  ?BUN 93* 58* 32* 37* 19  ?CREATININE 9.05* 5.78* 4.05* 5.57* 3.58*  ?CALCIUM 6.7* 7.9* 8.6* 8.7* 9.0  ?MG 1.6* 1.7 1.8 1.8 1.9  ?PHOS 8.3* 5.7* 4.2 3.4 2.6  ? ? ? ? ?CBC: ?Recent Labs  ?Lab  01/23/22 ?0628 01/24/22 ?5993 01/25/22 ?0510 01/26/22 ?5701 01/27/22 ?0400  ?WBC 9.2 10.0 11.6* 11.6* 11.3*  ?NEUTROABS 6.0 6.6 7.5 6.9 6.8  ?HGB 8.2* 9.1* 9.0* 7.7* 9.5*  ?HCT 23.7* 26.3* 27.1* 24.4* 29.0*  ?MCV 86.5 87.1 88.0 92.4 91.8  ?PLT 265 275 269 272 261  ? ? ? ?  ?Lab Results  ?Component Value Date  ? HEPBSAG NON REACTIVE 01/23/2022  ? HEPBSAB NON REACTIVE 01/23/2022  ? ? ? ? ?Microbiology: ? ?Recent Results (from the past 240 hour(s))  ?Resp Panel by RT-PCR (Flu A&B, Covid) Nasopharyngeal Swab     Status: None  ? Collection Time: 01/21/22  3:47 PM  ? Specimen: Nasopharyngeal Swab; Nasopharyngeal(NP) swabs in vial transport medium  ?Result Value Ref Range Status  ? SARS Coronavirus 2 by RT PCR NEGATIVE NEGATIVE Final  ?  Comment: (NOTE) ?SARS-CoV-2 target nucleic acids are NOT DETECTED. ? ?The SARS-CoV-2 RNA is generally detectable in upper respiratory ?specimens during the acute phase of infection. The lowest ?concentration of SARS-CoV-2 viral copies this assay can detect is ?138 copies/mL. A negative result does not preclude SARS-Cov-2 ?infection and should not be used as the sole basis for treatment or ?other patient management decisions. A negative result may occur with  ?improper specimen collection/handling, submission of specimen other ?  than nasopharyngeal swab, presence of viral mutation(s) within the ?areas targeted by this assay, and inadequate number of viral ?copies(<138 copies/mL). A negative result must be combined with ?clinical observations, patient history, and epidemiological ?information. The expected result is Negative. ? ?Fact Sheet for Patients:  ?EntrepreneurPulse.com.au ? ?Fact Sheet for Healthcare Providers:  ?IncredibleEmployment.be ? ?This test is no t yet approved or cleared by the [place] FDA and  ?has been authorized for detection and/or diagnosis of SARS-CoV-2 by ?FDA under an Emergency Use Authorization (EUA). This EUA will remain   ?in effect (meaning this test can be used) for the duration of the ?COVID-19 declaration under Section 564(b)(1) of the Act, 21 ?U.S.C.section 360bbb-3(b)(1), unless the authorization is terminated  ?or revoked sooner.  ? ? ?  ? Influenza A by PCR NEGATIVE NEGATIVE Final  ? Influenza B by PCR NEGATIVE NEGATIVE Final  ?  Comment: (NOTE) ?The Xpert Xpress SARS-CoV-2/FLU/RSV plus assay is intended as an aid ?in the diagnosis of influenza from Nasopharyngeal swab specimens and ?should not be used as a sole basis for treatment. Nasal washings and ?aspirates are unacceptable for Xpert Xpress SARS-CoV-2/FLU/RSV ?testing. ? ?Fact Sheet for Patients: ?EntrepreneurPulse.com.au ? ?Fact Sheet for Healthcare Providers: ?IncredibleEmployment.be ? ?This test is not yet approved or cleared by the [place] FDA and ?has been authorized for detection and/or diagnosis of SARS-CoV-2 by ?FDA under an Emergency Use Authorization (EUA). This EUA will remain ?in effect (meaning this test can be used) for the duration of the ?COVID-19 declaration under Section 564(b)(1) of the Act, 21 U.S.C. ?section 360bbb-3(b)(1), unless the authorization is terminated or ?revoked. ? ?Performed at [facility], [place], ?[place] [address] ?  ? ? ?Coagulation Studies: ?No results for input(s): LABPROT, INR in the last 72 hours. ? ?Urinalysis: ?No results for input(s): COLORURINE, LABSPEC, [place], GLUCOSEU, HGBUR, BILIRUBINUR, KETONESUR, PROTEINUR, UROBILINOGEN, NITRITE, LEUKOCYTESUR in the last 72 hours. ? ?Invalid input(s): APPERANCEUR ?  ? ? ?Imaging: ?No results found. ? ? ?Medications:  ? ? sodium chloride    ? sodium chloride    ? ? aspirin EC  81 mg Oral QHS  ? buPROPion  300 mg Oral Daily  ? busPIRone  15 mg Oral TID  ? calcium acetate  1,334 mg Oral TID WC  ? Chlorhexidine Gluconate Cloth  6 each Topical Q0600  ? darbepoetin (ARANESP) injection - NON-DIALYSIS  100 mcg Subcutaneous Q  Thu-1800  ? DULoxetine  30 mg Oral Daily  ? feeding supplement  237 mL Oral BID BM  ? ferrous sulfate  325 mg Oral BID WC  ? heparin injection (subcutaneous)  5,000 Units Subcutaneous Q12H  ? sodium chloride flush  3 mL Intravenous Q12H  ? ?sodium chloride, sodium chloride, acetaminophen **OR** acetaminophen, albuterol, alteplase, heparin, lidocaine (PF), lidocaine-prilocaine, midodrine, pentafluoroprop-tetrafluoroeth, zolpidem ? ?Assessment/ Plan:  ?71 y.o. female with fibromyalgia, chronic low back pain, history of anxiety and depression, history of stroke in 2009, diabetes 2009, obstructive sleep apnea, right hearing loss, history of kidney stones, frequent UTIs, history of bladder tack, history of nonsteroidal use-meloxicam, gastric bypass surgery in June 2020    admitted on 01/21/2022 for ?Weakness [R53.1] ?Acute renal failure (ARF) (HCC) [N17.9] ?Acute renal failure superimposed on chronic kidney disease, unspecified CKD stage, unspecified acute renal failure type ([place]) [N17.9, N18.9] ? ?#End-stage renal disease with hypokalemia ?Patient's renal failure has progressed to end-stage renal disease. ?CKD risk factors include diabetes, hypertension, kidney stones, frequent UTIs, history of nonsteroidal use, IV contrast  exposure, atherosclerosis and advanced age. ?Patient had a renal biopsy in October 2022 which was consistent with arterionephrosclerosis, severe global glomerulosclerosis, interstitial fibrosis all features consistent with diabetic, sclerosis.  Calcium oxalate crystals were also noted. ?-Rt permcath placed by vascular on 01/23/22. Dialysis initiated after placement.  ? ?Plan ?-Received dialysis yesterday, UF goal 524m achieved ?-Next treatment scheduled for Wednesday.  ?-Dialysis coordinator to begin outpatient dialysis clinic search  ? ?#Anemia of chronic kidney disease, severe ?Hgb stable at 9.5 ?Continue Aranesp weekly ? ?#Acute metabolic acidosis ?Bicarb level has improved with IV  supplementation ?Resolved ? ? LOS: 6 ?[place]?4/4/202312:19 PM ? ?[facility] ?[place]?[phone]? ? ?

## 2022-01-27 NOTE — TOC Progression Note (Signed)
Patient will go to [facility] once gets Dialysis chair time, Ins approved ?

## 2022-01-27 NOTE — Progress Notes (Signed)
[place] at [facility] ? ? ?Anita Greene NAME: Anita Greene   ? ?MR#:  [id number] ? ?DATE OF BIRTH:  [birth year] ? ?SUBJECTIVE:  ? ?sitting in the recliner chair. No complaints. ? ?VITALS:  ?Blood pressure 135/82, pulse (!) 102, temperature 98.4 ?F (36.9 ?C), resp. rate 16, height '5\' 1"'$  (1.549 m), weight 62.5 kg, SpO2 100 %. ? ?PHYSICAL EXAMINATION:  ? ?GENERAL:  71 y.o.-year-old Anita Greene lying in the bed with no acute distress. Right IJ HD access + ?LUNGS: Normal breath sounds bilaterally, no wheezing, rales, rhonchi.  ?CARDIOVASCULAR: S1, S2 normal. No murmurs, rubs, or gallops.  ?ABDOMEN: Soft, nontender, nondistended. Bowel sounds present.  ?EXTREMITIES: No  edema b/l.    ?NEUROLOGIC: nonfocal  Anita Greene is alert and awake ?SKIN: No obvious rash, lesion, or ulcer.  ? ?LABORATORY PANEL:  ?CBC ?Recent Labs  ?Lab 01/27/22 ?0400  ?WBC 11.3*  ?HGB 9.5*  ?HCT 29.0*  ?PLT 261  ? ? ?Chemistries  ?Recent Labs  ?Lab 01/21/22 ?1036 01/21/22 ?1214 01/27/22 ?0400  ?NA 134*   < > 137  ?K 3.7   < > 4.1  ?CL 106   < > 97*  ?CO2 11*   < > 27  ?GLUCOSE 133*   < > 97  ?BUN 99*   < > 19  ?CREATININE 10.53*   < > 3.58*  ?CALCIUM 7.3*   < > 9.0  ?MG  --    < > 1.9  ?AST 20  --   --   ?ALT 15  --   --   ?ALKPHOS 88  --   --   ?BILITOT 0.7  --   --   ? < > = values in this interval not displayed.  ? ? ?Assessment and Plan ?Hospital Course: ?Anita Greene is a 71 yo female with PMH CKD3, GERD, B12 deficiency, HTN, IDA, OSA, RLS, CVA, DMII who presented with progressively worsening fatigue/malaise, lower extremity weakness, and hand tremors.  She is followed closely outpatient by nephrology and appears to have had rapidly progressive worsening renal function.  Per nephrology, renal biopsy October 2022 showed arterionephrosclerosis, severe global glomerulosclerosis, interstitial fibrosis. ?She was admitted to have dialysis initiated inpatient.  She underwent PermCath placement followed by initial dialysis session on  01/23/2022. ? ?End-stage renal disease now on dialysis-- new ?-- Anita Greene CKD progress to end-stage renal disease ?-- right perm cath placed and dialysis initiated. Anita Greene awaiting outpatient chair time and then will discharged [facility] ?-- potassium stable ? ?anemia of chronic disease ?-- appears mixed etiology with history of iron deficiency and worsening renal function ?-- Anita Greene is status post one unit blood transfusion on 30th March ?-- iron studies stable ? ?physical deconditioning/generalized weakness ?-- physical therapy recommends rehab ? ?Hypertension ?-- BP meds held for now due to intermittent hypotension ? ?obstructive sleep apnea ?-- continue CPAP ? ?history of CVA ?-- on aspirin ? ?type II diabetes with renal manifestations, controlled ?-- continue sliding scale ? ? ?Procedures: Right IJ HD access ?Family communication :none ?Consults :nephrology ?CODE STATUS: FULL ?DVT Prophylaxis :heparin ?Level of care: Telemetry Medical ?Status is: Inpatient ?Remains inpatient appropriate because: Anita Greene is medically best at baseline for discharge.  ?Barrier to discharge outpatient HD chair time pending ?  ? ?TOTAL TIME TAKING CARE OF THIS Anita Greene: 25 minutes.  ?>50% time spent on counselling and coordination of care ? ?Note: This dictation was prepared with Dragon dictation along with smaller phrase technology. Any transcriptional errors that result  from this process are unintentional. ? ?Fritzi Mandes M.D  ? ? ?[facility]  ? ?CC: ?Primary care physician; [place] Pink, MD  ?

## 2022-01-27 NOTE — Plan of Care (Signed)

## 2022-01-28 DIAGNOSIS — Z8673 Personal history of transient ischemic attack (TIA), and cerebral infarction without residual deficits: Secondary | ICD-10-CM | POA: Diagnosis not present

## 2022-01-28 DIAGNOSIS — D638 Anemia in other chronic diseases classified elsewhere: Secondary | ICD-10-CM | POA: Diagnosis not present

## 2022-01-28 DIAGNOSIS — N186 End stage renal disease: Secondary | ICD-10-CM | POA: Diagnosis not present

## 2022-01-28 DIAGNOSIS — G4733 Obstructive sleep apnea (adult) (pediatric): Secondary | ICD-10-CM | POA: Diagnosis not present

## 2022-01-28 LAB — NEURON-SPECIFIC ENOLASE(NSE), BLOOD: Neuron-specific Enolase, Serum: 12 ng/mL (ref 0.0–17.6)

## 2022-01-28 MED ORDER — GABAPENTIN 100 MG PO CAPS
100.0000 mg | ORAL_CAPSULE | Freq: Every evening | ORAL | Status: DC
  Administered 2022-01-28 – 2022-01-29 (×2): 100 mg via ORAL
  Filled 2022-01-28 (×2): qty 1

## 2022-01-28 MED ORDER — HEPARIN SODIUM (PORCINE) 1000 UNIT/ML IJ SOLN
INTRAMUSCULAR | Status: AC
  Filled 2022-01-28: qty 10

## 2022-01-28 MED ORDER — ONDANSETRON HCL 4 MG/2ML IJ SOLN
4.0000 mg | Freq: Once | INTRAMUSCULAR | Status: AC
  Administered 2022-01-28: 4 mg via INTRAVENOUS

## 2022-01-28 MED ORDER — ROPINIROLE HCL 0.25 MG PO TABS
0.2500 mg | ORAL_TABLET | Freq: Every day | ORAL | Status: DC
  Administered 2022-01-28 – 2022-01-29 (×2): 0.25 mg via ORAL
  Filled 2022-01-28 (×2): qty 1

## 2022-01-28 MED ORDER — ONDANSETRON HCL 4 MG/2ML IJ SOLN
INTRAMUSCULAR | Status: AC
  Filled 2022-01-28: qty 2

## 2022-01-28 MED ORDER — ONDANSETRON 4 MG PO TBDP
4.0000 mg | ORAL_TABLET | Freq: Once | ORAL | Status: DC
  Filled 2022-01-28: qty 1

## 2022-01-28 MED ORDER — RENA-VITE PO TABS
1.0000 | ORAL_TABLET | Freq: Every day | ORAL | Status: DC
  Administered 2022-01-28 – 2022-01-29 (×2): 1 via ORAL
  Filled 2022-01-28 (×2): qty 1

## 2022-01-28 MED ORDER — NEPRO/CARBSTEADY PO LIQD
237.0000 mL | Freq: Every day | ORAL | Status: DC
  Administered 2022-01-28: 237 mL via ORAL

## 2022-01-28 NOTE — Plan of Care (Signed)

## 2022-01-28 NOTE — Progress Notes (Signed)
[place] at [facility] ? ? ?PATIENT NAME: Anita Greene   ? ?MR#:  [id number] ? ?DATE OF BIRTH:  21-Jun-1951 ? ?SUBJECTIVE:  ? ? ?Just got back from HD No complaints. ?VITALS:  ?Blood pressure 136/64, pulse 95, temperature 98.4 ?F (36.9 ?C), temperature source Oral, resp. rate 19, height '5\' 1"'$  (1.549 m), weight 57.6 kg, SpO2 100 %. ? ?PHYSICAL EXAMINATION:  ? ?GENERAL:  72 y.o.-year-old patient lying in the bed with no acute distress. Right IJ HD access + ?LUNGS: Normal breath sounds bilaterally, no wheezing, rales, rhonchi.  ?CARDIOVASCULAR: S1, S2 normal. No murmurs, rubs, or gallops.  ?ABDOMEN: Soft, nontender, nondistended. Bowel sounds present.  ?EXTREMITIES: No  edema b/l.    ?NEUROLOGIC: nonfocal  patient is alert and awake ?SKIN: No obvious rash, lesion, or ulcer.  ? ?LABORATORY PANEL:  ?CBC ?Recent Labs  ?Lab 01/27/22 ?0400  ?WBC 11.3*  ?HGB 9.5*  ?HCT 29.0*  ?PLT 261  ? ? ? ?Chemistries  ?Recent Labs  ?Lab 01/27/22 ?0400  ?NA 137  ?K 4.1  ?CL 97*  ?CO2 27  ?GLUCOSE 97  ?BUN 19  ?CREATININE 3.58*  ?CALCIUM 9.0  ?MG 1.9  ? ? ? ?Assessment and Plan ?Hospital Course: ?[name] is a 71 yo female with PMH CKD3, GERD, B12 deficiency, HTN, IDA, OSA, RLS, CVA, DMII who presented with progressively worsening fatigue/malaise, lower extremity weakness, and hand tremors.  She is followed closely outpatient by nephrology and appears to have had rapidly progressive worsening renal function.  Per nephrology, renal biopsy October 2022 showed arterionephrosclerosis, severe global glomerulosclerosis, interstitial fibrosis. ?She was admitted to have dialysis initiated inpatient.  She underwent PermCath placement followed by initial dialysis session on 01/23/2022. ? ?End-stage renal disease now on dialysis-- new ?-- patient CKD progress to end-stage renal disease ?-- right perm cath placed and dialysis initiated. Patient awaiting outpatient chair time and then will discharged to [facility] ?--  potassium stable ? ?anemia of chronic disease ?-- appears mixed etiology with history of iron deficiency and worsening renal function ?-- patient is status post one unit blood transfusion on 30th March ?-- iron studies stable ? ?physical deconditioning/generalized weakness ?-- physical therapy recommends rehab ? ?Hypertension ?-- BP meds held for now due to intermittent hypotension ? ?obstructive sleep apnea ?-- continue CPAP ? ?history of CVA ?-- on aspirin ? ?type II diabetes with renal manifestations, controlled ?-- continue sliding scale ? ? ?Procedures: Right IJ HD access ?Family communication :none ?Consults :nephrology ?CODE STATUS: FULL ?DVT Prophylaxis :heparin ?Level of care: Telemetry Medical ?Status is: Inpatient ?Remains inpatient appropriate because: patient is medically best at baseline for discharge.  ?Barrier to discharge outpatient HD chair time pending ?  ? ?TOTAL TIME TAKING CARE OF THIS PATIENT: 25 minutes.  ?>50% time spent on counselling and coordination of care ? ?Note: This dictation was prepared with Dragon dictation along with smaller phrase technology. Any transcriptional errors that result from this process are unintentional. ? ?Fritzi Mandes M.D  ? ? ?[facility]  ? ?CC: ?Primary care physician; [place] Pink, MD  ?

## 2022-01-28 NOTE — TOC Progression Note (Signed)
Transition of Care (TOC) - Progression Note  ? ? ?Patient Details  ?Name: Anita Greene ?MRN: [id number] ?Date of Birth: 1951/05/27 ? ?Transition of Care (TOC) CM/SW Contact  ?Conception Oms, RN ?Phone Number: ?01/28/2022, 10:52 AM ? ?Clinical Narrative:   Waiting on Chair time to be able to DC to [facility] ? ? ? ?  ?  ? ?Expected Discharge Plan and Services ?  ?  ?  ?  ?  ?                ?  ?  ?  ?  ?  ?  ?  ?  ?  ?  ? ? ?Social Determinants of Health (SDOH) Interventions ?  ? ?Readmission Risk Interventions ?   ? View : No data to display.  ?  ?  ?  ? ? ?

## 2022-01-28 NOTE — Progress Notes (Signed)
Nutrition Follow-up ? ?DOCUMENTATION CODES:  ? ?Not applicable ? ?INTERVENTION:  ? ?-D/c Ensure ?-Renal MVI daily ?-Nepro Shake po daily, each supplement provides 425 kcal and 19 grams protein  ?-Liberalize diet to 2 gram sodium ? ?NUTRITION DIAGNOSIS:  ? ?Increased nutrient needs related to acute illness as evidenced by estimated needs. ? ?Ongoing ? ?GOAL:  ? ?Patient will meet greater than or equal to 90% of their needs ? ?Progressing  ? ?MONITOR:  ? ?PO intake, Supplement acceptance, Weight trends, I & O's ? ?REASON FOR ASSESSMENT:  ? ?Malnutrition Screening Tool ?  ? ?ASSESSMENT:  ? ?71 y.o. female with PMH of type 2 DM, GERD (s/p Nissen fundoplication), HTN, hx CVA, IBS, vitamin b12 deficiency, depression/anxiety, and CKD3 presented to ED at the advice of PCP due to progressive fatigue/malaise and lower extremity weakness with tremors. Pt reports feeling unwell for 2-3 weeks since being treated for UTI. ? ?3/30- permacath placement ?3/31- first HD treatment.  ? ?Reviewed I/O's: +190 ml x 24 hours and -278 ml since admission ? ?UOP: 50 ml x 24 hours  ? ?Pt unavailable at time of visit. Attempted to speak with pt via call to hospital room phone, however, unable to reach.  ? ?Noted pt in HD at time of visit.  ? ?Pt with good appetite. Noted meal completion 50-100%. Pt is refusing Ensure supplements.  ? ?Per MD notes, plan to d/c to SNF once HD chair is obtained as outpatient. Pt is interested in pursuing PD as an outpatient.  ? ?Medications reviewed and include aranesp, ferrous sulfate, and phoslo. ? ?Labs reviewed: CBGS: 82.  ? ?Diet Order:   ?Diet Order   ? ?       ?  Diet renal with fluid restriction Fluid restriction: 1200 mL Fluid; Room service appropriate? Yes; Fluid consistency: Thin  Diet effective now       ?  ? ?  ?  ? ?  ? ? ?EDUCATION NEEDS:  ? ?No education needs have been identified at this time ? ?Skin:  Skin Assessment: Reviewed RN Assessment ? ?Last BM:  01/27/22 ? ?Height:  ? ?Ht Readings from Last  1 Encounters:  ?01/21/22 '5\' 1"'$  (1.549 m)  ? ? ?Weight:  ? ?Wt Readings from Last 1 Encounters:  ?01/28/22 57.2 kg  ? ? ?Ideal Body Weight:  47.7 kg ? ?BMI:  Body mass index is 23.83 kg/m?. ? ?Estimated Nutritional Needs:  ? ?Kcal:  1500-1700 ? ?Protein:  75-90 grams ? ?Fluid:  1000 ml +UOP ? ? ? ?Loistine Chance, RD, LDN, CDCES ?Registered Dietitian II ?Certified Diabetes Care and Education Specialist ?Please refer to [facility] for RD and/or RD on-call/weekend/after hours pager  ?

## 2022-01-28 NOTE — Progress Notes (Addendum)
[facility] ?[place], [place] ?01/28/22 ? ?Subjective:  ? ?Hospital day # 7 ? ?Patient known to our practice from outpatient follow-up. ?She presents to the emergency room for not feeling well for the past 2 to 3 weeks.  She had lower extremity weakness and states that she was not able to walk even to 3 steps. ? ?Update: ? ?Patient seen and evaluated during dialysis ?  ?HEMODIALYSIS FLOWSHEET: ? ?Blood Flow Rate (mL/min): 350 mL/min ?Arterial Pressure (mmHg): -150 mmHg ?Venous Pressure (mmHg): 120 mmHg ?Transmembrane Pressure (mmHg): 60 mmHg ?Ultrafiltration Rate (mL/min): 340 mL/min ?Dialysate Flow Rate (mL/min): 500 ml/min ?Conductivity: Machine : 13.9 ?Conductivity: Machine : 13.9 ?Dialysis Fluid Bolus: Normal Saline ?Bolus Amount (mL): 250 mL ?Dialysate Change: Other (comment) (3.0k 2.5ca) ? ?Patient states she is starting to feel better overall, does complain of some nausea this morning ?Patient curious about discharge planning and dialysis placement.   ?Has poor appetite due to nausea ?No lower extremity edema ? ?Tolerating dialysis well, seated in chair ? ?Objective:  ?Vital signs in last 24 hours:  ?Temp:  [98.2 ?F (36.8 ?C)-99 ?F (37.2 ?C)] 98.4 ?F (36.9 ?C) (04/05 7741) ?Pulse Rate:  [85-102] 89 (04/05 1115) ?Resp:  [15-20] 17 (04/05 1115) ?BP: (110-165)/(60-85) 126/82 (04/05 1115) ?SpO2:  [96 %-100 %] 99 % (04/05 1115) ?Weight:  [57.2 kg] 57.2 kg (04/05 0921) ? ?Weight change:  ?Filed Weights  ? 01/26/22 1222 01/27/22 0500 01/28/22 0921  ?Weight: 59.2 kg 62.5 kg 57.2 kg  ? ? ?Intake/Output: ?  ? ?Intake/Output Summary (Last 24 hours) at 01/28/2022 1130 ?Last data filed at 01/27/2022 1300 ?Gross per 24 hour  ?Intake 120 ml  ?Output --  ?Net 120 ml  ? ? ? ?Physical Exam: ?General:  No acute distress, seated in chair  ?HEENT  anicteric, moist oral mucous membrane  ?Pulm/lungs  normal breathing effort, lungs are clear to auscultation  ?CVS/Heart  regular rhythm, no rub or gallop  ?Abdomen:    Soft, nontender  ?Extremities:  No peripheral edema  ?Neurologic:  Alert, oriented, able to follow commands  ?Skin:  No acute rashes  ?Access: Rt Permcath ? ?Basic Metabolic Panel: ? ?Recent Labs  ?Lab 01/23/22 ?0628 01/24/22 ?2878 01/25/22 ?0510 01/26/22 ?6767 01/27/22 ?0400  ?NA 137 136 138 135 137  ?K 3.2* 3.4* 3.2* 3.6 4.1  ?CL 105 99 100 99 97*  ?CO2 16* '25 28 26 27  '$ ?GLUCOSE 85 96 119* 93 97  ?BUN 93* 58* 32* 37* 19  ?CREATININE 9.05* 5.78* 4.05* 5.57* 3.58*  ?CALCIUM 6.7* 7.9* 8.6* 8.7* 9.0  ?MG 1.6* 1.7 1.8 1.8 1.9  ?PHOS 8.3* 5.7* 4.2 3.4 2.6  ? ? ? ? ?CBC: ?Recent Labs  ?Lab 01/23/22 ?0628 01/24/22 ?2094 01/25/22 ?0510 01/26/22 ?7096 01/27/22 ?0400  ?WBC 9.2 10.0 11.6* 11.6* 11.3*  ?NEUTROABS 6.0 6.6 7.5 6.9 6.8  ?HGB 8.2* 9.1* 9.0* 7.7* 9.5*  ?HCT 23.7* 26.3* 27.1* 24.4* 29.0*  ?MCV 86.5 87.1 88.0 92.4 91.8  ?PLT 265 275 269 272 261  ? ? ? ?  ?Lab Results  ?Component Value Date  ? HEPBSAG NON REACTIVE 01/23/2022  ? HEPBSAB NON REACTIVE 01/23/2022  ? ? ? ? ?Microbiology: ? ?Recent Results (from the past 240 hour(s))  ?Resp Panel by RT-PCR (Flu A&B, Covid) Nasopharyngeal Swab     Status: None  ? Collection Time: 01/21/22  3:47 PM  ? Specimen: Nasopharyngeal Swab; Nasopharyngeal(NP) swabs in vial transport medium  ?Result Value Ref Range Status  ? SARS Coronavirus  2 by RT PCR NEGATIVE NEGATIVE Final  ?  Comment: (NOTE) ?SARS-CoV-2 target nucleic acids are NOT DETECTED. ? ?The SARS-CoV-2 RNA is generally detectable in upper respiratory ?specimens during the acute phase of infection. The lowest ?concentration of SARS-CoV-2 viral copies this assay can detect is ?138 copies/mL. A negative result does not preclude SARS-Cov-2 ?infection and should not be used as the sole basis for treatment or ?other patient management decisions. A negative result may occur with  ?improper specimen collection/handling, submission of specimen other ?than nasopharyngeal swab, presence of viral mutation(s) within the ?areas targeted  by this assay, and inadequate number of viral ?copies(<138 copies/mL). A negative result must be combined with ?clinical observations, patient history, and epidemiological ?information. The expected result is Negative. ? ?Fact Sheet for Patients:  ?EntrepreneurPulse.com.au ? ?Fact Sheet for Healthcare Providers:  ?IncredibleEmployment.be ? ?This test is no t yet approved or cleared by the [place] FDA and  ?has been authorized for detection and/or diagnosis of SARS-CoV-2 by ?FDA under an Emergency Use Authorization (EUA). This EUA will remain  ?in effect (meaning this test can be used) for the duration of the ?COVID-19 declaration under Section 564(b)(1) of the Act, 21 ?U.S.C.section 360bbb-3(b)(1), unless the authorization is terminated  ?or revoked sooner.  ? ? ?  ? Influenza A by PCR NEGATIVE NEGATIVE Final  ? Influenza B by PCR NEGATIVE NEGATIVE Final  ?  Comment: (NOTE) ?The Xpert Xpress SARS-CoV-2/FLU/RSV plus assay is intended as an aid ?in the diagnosis of influenza from Nasopharyngeal swab specimens and ?should not be used as a sole basis for treatment. Nasal washings and ?aspirates are unacceptable for Xpert Xpress SARS-CoV-2/FLU/RSV ?testing. ? ?Fact Sheet for Patients: ?EntrepreneurPulse.com.au ? ?Fact Sheet for Healthcare Providers: ?IncredibleEmployment.be ? ?This test is not yet approved or cleared by the [place] FDA and ?has been authorized for detection and/or diagnosis of SARS-CoV-2 by ?FDA under an Emergency Use Authorization (EUA). This EUA will remain ?in effect (meaning this test can be used) for the duration of the ?COVID-19 declaration under Section 564(b)(1) of the Act, 21 U.S.C. ?section 360bbb-3(b)(1), unless the authorization is terminated or ?revoked. ? ?Performed at [facility], [place], ?[place] [address] ?  ? ? ?Coagulation Studies: ?No results for input(s): LABPROT, INR in the  last 72 hours. ? ?Urinalysis: ?No results for input(s): COLORURINE, LABSPEC, [place], GLUCOSEU, HGBUR, BILIRUBINUR, KETONESUR, PROTEINUR, UROBILINOGEN, NITRITE, LEUKOCYTESUR in the last 72 hours. ? ?Invalid input(s): APPERANCEUR ?  ? ? ?Imaging: ?No results found. ? ? ?Medications:  ? ? sodium chloride    ? sodium chloride    ? ? aspirin EC  81 mg Oral QHS  ? buPROPion  300 mg Oral Daily  ? busPIRone  15 mg Oral TID  ? calcium acetate  1,334 mg Oral TID WC  ? Chlorhexidine Gluconate Cloth  6 each Topical Q0600  ? darbepoetin (ARANESP) injection - NON-DIALYSIS  100 mcg Subcutaneous Q Thu-1800  ? DULoxetine  30 mg Oral Daily  ? feeding supplement (NEPRO CARB STEADY)  237 mL Oral QHS  ? ferrous sulfate  325 mg Oral BID WC  ? heparin injection (subcutaneous)  5,000 Units Subcutaneous Q12H  ? heparin sodium (porcine)      ? multivitamin  1 tablet Oral QHS  ? ondansetron      ? sodium chloride flush  3 mL Intravenous Q12H  ? ?sodium chloride, sodium chloride, acetaminophen **OR** acetaminophen, albuterol, alteplase, heparin, lidocaine (PF), lidocaine-prilocaine, midodrine, pentafluoroprop-tetrafluoroeth, zolpidem ? ?Assessment/  Plan:  ?71 y.o. female with fibromyalgia, chronic low back pain, history of anxiety and depression, history of stroke in 2009, diabetes 2009, obstructive sleep apnea, right hearing loss, history of kidney stones, frequent UTIs, history of bladder tack, history of nonsteroidal use-meloxicam, gastric bypass surgery in June 2020    admitted on 01/21/2022 for ?Weakness [R53.1] ?Acute renal failure (ARF) (HCC) [N17.9] ?Acute renal failure superimposed on chronic kidney disease, unspecified CKD stage, unspecified acute renal failure type ([place]) [N17.9, N18.9] ? ?#End-stage renal disease with hypokalemia ?Patient's renal failure has progressed to end-stage renal disease. ?CKD risk factors include diabetes, hypertension, kidney stones, frequent UTIs, history of nonsteroidal use, IV contrast exposure,  atherosclerosis and advanced age. ?Patient had a renal biopsy in October 2022 which was consistent with arterionephrosclerosis, severe global glomerulosclerosis, interstitial fibrosis all features consistent w

## 2022-01-29 DIAGNOSIS — D638 Anemia in other chronic diseases classified elsewhere: Secondary | ICD-10-CM | POA: Diagnosis not present

## 2022-01-29 DIAGNOSIS — G4733 Obstructive sleep apnea (adult) (pediatric): Secondary | ICD-10-CM | POA: Diagnosis not present

## 2022-01-29 DIAGNOSIS — N186 End stage renal disease: Secondary | ICD-10-CM | POA: Diagnosis not present

## 2022-01-29 DIAGNOSIS — Z8673 Personal history of transient ischemic attack (TIA), and cerebral infarction without residual deficits: Secondary | ICD-10-CM | POA: Diagnosis not present

## 2022-01-29 MED ORDER — ONDANSETRON HCL 4 MG PO TABS
4.0000 mg | ORAL_TABLET | Freq: Four times a day (QID) | ORAL | Status: DC | PRN
  Administered 2022-01-29: 4 mg via ORAL
  Filled 2022-01-29: qty 1

## 2022-01-29 NOTE — Progress Notes (Addendum)
[facility] ?[place], [place] ?01/29/22 ? ?Subjective:  ? ?Hospital day # 8 ? ?Patient known to our practice from outpatient follow-up. ?She presents to the emergency room for not feeling well for the past 2 to 3 weeks.  She had lower extremity weakness and states that she was not able to walk even to 3 steps. ? ?Update: ? ?Patient resting in bed, alert and oriented ?Tolerating meals ?Reports improved mood today ?Remains on room air ? ? ?Objective:  ?Vital signs in last 24 hours:  ?Temp:  [98.2 ?F (36.8 ?C)-99.8 ?F (37.7 ?C)] 98.2 ?F (36.8 ?C) (04/06 2637) ?Pulse Rate:  [81-95] 81 (04/06 0728) ?Resp:  [12-20] 14 (04/06 0728) ?BP: (108-157)/(64-91) 144/66 (04/06 0728) ?SpO2:  [99 %-100 %] 100 % (04/06 0728) ?Weight:  [57.6 kg] 57.6 kg (04/05 1237) ? ?Weight change:  ?Filed Weights  ? 01/27/22 0500 01/28/22 0921 01/28/22 1237  ?Weight: 62.5 kg 57.2 kg 57.6 kg  ? ? ?Intake/Output: ?  ? ?Intake/Output Summary (Last 24 hours) at 01/29/2022 1144 ?Last data filed at 01/29/2022 1020 ?Gross per 24 hour  ?Intake 0 ml  ?Output 523 ml  ?Net -523 ml  ? ? ? ?Physical Exam: ?General:  No acute distress  ?HEENT  anicteric, moist oral mucous membrane  ?Pulm/lungs  normal breathing effort, lungs are clear to auscultation  ?CVS/Heart  regular rhythm, no rub or gallop  ?Abdomen:   Soft, nontender  ?Extremities:  No peripheral edema  ?Neurologic:  Alert, oriented, able to follow commands  ?Skin:  No acute rashes  ?Access: Rt Permcath ? ?Basic Metabolic Panel: ? ?Recent Labs  ?Lab 01/23/22 ?0628 01/24/22 ?8588 01/25/22 ?0510 01/26/22 ?5027 01/27/22 ?0400  ?NA 137 136 138 135 137  ?K 3.2* 3.4* 3.2* 3.6 4.1  ?CL 105 99 100 99 97*  ?CO2 16* '25 28 26 27  '$ ?GLUCOSE 85 96 119* 93 97  ?BUN 93* 58* 32* 37* 19  ?CREATININE 9.05* 5.78* 4.05* 5.57* 3.58*  ?CALCIUM 6.7* 7.9* 8.6* 8.7* 9.0  ?MG 1.6* 1.7 1.8 1.8 1.9  ?PHOS 8.3* 5.7* 4.2 3.4 2.6  ? ? ? ? ?CBC: ?Recent Labs  ?Lab 01/23/22 ?0628 01/24/22 ?7412 01/25/22 ?0510 01/26/22 ?8786  01/27/22 ?0400  ?WBC 9.2 10.0 11.6* 11.6* 11.3*  ?NEUTROABS 6.0 6.6 7.5 6.9 6.8  ?HGB 8.2* 9.1* 9.0* 7.7* 9.5*  ?HCT 23.7* 26.3* 27.1* 24.4* 29.0*  ?MCV 86.5 87.1 88.0 92.4 91.8  ?PLT 265 275 269 272 261  ? ? ? ?  ?Lab Results  ?Component Value Date  ? HEPBSAG NON REACTIVE 01/23/2022  ? HEPBSAB NON REACTIVE 01/23/2022  ? ? ? ? ?Microbiology: ? ?Recent Results (from the past 240 hour(s))  ?Resp Panel by RT-PCR (Flu A&B, Covid) Nasopharyngeal Swab     Status: None  ? Collection Time: 01/21/22  3:47 PM  ? Specimen: Nasopharyngeal Swab; Nasopharyngeal(NP) swabs in vial transport medium  ?Result Value Ref Range Status  ? SARS Coronavirus 2 by RT PCR NEGATIVE NEGATIVE Final  ?  Comment: (NOTE) ?SARS-CoV-2 target nucleic acids are NOT DETECTED. ? ?The SARS-CoV-2 RNA is generally detectable in upper respiratory ?specimens during the acute phase of infection. The lowest ?concentration of SARS-CoV-2 viral copies this assay can detect is ?138 copies/mL. A negative result does not preclude SARS-Cov-2 ?infection and should not be used as the sole basis for treatment or ?other patient management decisions. A negative result may occur with  ?improper specimen collection/handling, submission of specimen other ?than nasopharyngeal swab, presence of viral mutation(s)  within the ?areas targeted by this assay, and inadequate number of viral ?copies(<138 copies/mL). A negative result must be combined with ?clinical observations, patient history, and epidemiological ?information. The expected result is Negative. ? ?Fact Sheet for Patients:  ?EntrepreneurPulse.com.au ? ?Fact Sheet for Healthcare Providers:  ?IncredibleEmployment.be ? ?This test is no t yet approved or cleared by the [place] FDA and  ?has been authorized for detection and/or diagnosis of SARS-CoV-2 by ?FDA under an Emergency Use Authorization (EUA). This EUA will remain  ?in effect (meaning this test can be used) for the duration of  the ?COVID-19 declaration under Section 564(b)(1) of the Act, 21 ?U.S.C.section 360bbb-3(b)(1), unless the authorization is terminated  ?or revoked sooner.  ? ? ?  ? Influenza A by PCR NEGATIVE NEGATIVE Final  ? Influenza B by PCR NEGATIVE NEGATIVE Final  ?  Comment: (NOTE) ?The Xpert Xpress SARS-CoV-2/FLU/RSV plus assay is intended as an aid ?in the diagnosis of influenza from Nasopharyngeal swab specimens and ?should not be used as a sole basis for treatment. Nasal washings and ?aspirates are unacceptable for Xpert Xpress SARS-CoV-2/FLU/RSV ?testing. ? ?Fact Sheet for Patients: ?EntrepreneurPulse.com.au ? ?Fact Sheet for Healthcare Providers: ?IncredibleEmployment.be ? ?This test is not yet approved or cleared by the [place] FDA and ?has been authorized for detection and/or diagnosis of SARS-CoV-2 by ?FDA under an Emergency Use Authorization (EUA). This EUA will remain ?in effect (meaning this test can be used) for the duration of the ?COVID-19 declaration under Section 564(b)(1) of the Act, 21 U.S.C. ?section 360bbb-3(b)(1), unless the authorization is terminated or ?revoked. ? ?Performed at [facility], [place], ?[place] [address] ?  ? ? ?Coagulation Studies: ?No results for input(s): LABPROT, INR in the last 72 hours. ? ?Urinalysis: ?No results for input(s): COLORURINE, LABSPEC, [place], GLUCOSEU, HGBUR, BILIRUBINUR, KETONESUR, PROTEINUR, UROBILINOGEN, NITRITE, LEUKOCYTESUR in the last 72 hours. ? ?Invalid input(s): APPERANCEUR ?  ? ? ?Imaging: ?No results found. ? ? ?Medications:  ? ? sodium chloride    ? sodium chloride    ? ? aspirin EC  81 mg Oral QHS  ? buPROPion  300 mg Oral Daily  ? busPIRone  15 mg Oral TID  ? calcium acetate  1,334 mg Oral TID WC  ? Chlorhexidine Gluconate Cloth  6 each Topical Q0600  ? darbepoetin (ARANESP) injection - NON-DIALYSIS  100 mcg Subcutaneous Q Thu-1800  ? DULoxetine  30 mg Oral Daily  ? feeding supplement  (NEPRO CARB STEADY)  237 mL Oral QHS  ? ferrous sulfate  325 mg Oral BID WC  ? gabapentin  100 mg Oral QPM  ? heparin injection (subcutaneous)  5,000 Units Subcutaneous Q12H  ? multivitamin  1 tablet Oral QHS  ? rOPINIRole  0.25 mg Oral QHS  ? sodium chloride flush  3 mL Intravenous Q12H  ? ?sodium chloride, sodium chloride, acetaminophen **OR** acetaminophen, albuterol, alteplase, heparin, lidocaine (PF), lidocaine-prilocaine, midodrine, ondansetron, pentafluoroprop-tetrafluoroeth, zolpidem ? ?Assessment/ Plan:  ?71 y.o. female with fibromyalgia, chronic low back pain, history of anxiety and depression, history of stroke in 2009, diabetes 2009, obstructive sleep apnea, right hearing loss, history of kidney stones, frequent UTIs, history of bladder tack, history of nonsteroidal use-meloxicam, gastric bypass surgery in June 2020    admitted on 01/21/2022 for ?Weakness [R53.1] ?Acute renal failure (ARF) (HCC) [N17.9] ?Acute renal failure superimposed on chronic kidney disease, unspecified CKD stage, unspecified acute renal failure type ([place]) [N17.9, N18.9] ? ?#End-stage renal disease with hypokalemia ?Patient's renal failure has progressed  to end-stage renal disease. ?CKD risk factors include diabetes, hypertension, kidney stones, frequent UTIs, history of nonsteroidal use, IV contrast exposure, atherosclerosis and advanced age. ?Patient had a renal biopsy in October 2022 which was consistent with arterionephrosclerosis, severe global glomerulosclerosis, interstitial fibrosis all features consistent with diabetic, sclerosis.  Calcium oxalate crystals were also noted. ?-Rt permcath placed by vascular on 01/23/22. Dialysis initiated after placement.  ? ?Plan ?-Received dialysis yesterday, UF goal 583m. Tolerated dialysis well seated in chair. Next treatment scheduled for Friday. ?[facility]. Patient  cleared to discharge from renal stance ? ? ?#Anemia of chronic kidney disease, severe ?Continue Aranesp weekly ?Hgb stable. Will recheck labs in am ? ?#Acute metabolic acidosis ?Bicarb levels improved wit

## 2022-01-29 NOTE — Plan of Care (Signed)
?  Problem: Education: ?Goal: Knowledge of General Education information will improve ?Description: Including pain rating scale, medication(s)/side effects and non-pharmacologic comfort measures ?Outcome: Progressing ?  ?Problem: Health Behavior/Discharge Planning: ?Goal: Ability to manage health-related needs will improve ?Outcome: Progressing ?  ?Problem: Clinical Measurements: ?Goal: Ability to maintain clinical measurements within normal limits will improve ?Outcome: Progressing ?Goal: Will remain free from infection ?Outcome: Progressing ?Goal: Diagnostic test results will improve ?Outcome: Progressing ?Goal: Respiratory complications will improve ?Outcome: Progressing ?Goal: Cardiovascular complication will be avoided ?Outcome: Progressing ?  ?Problem: Activity: ?Goal: Risk for activity intolerance will decrease ?Outcome: Progressing ?  ?Problem: Nutrition: ?Goal: Adequate nutrition will be maintained ?Outcome: Progressing ?  ?Problem: Coping: ?Goal: Level of anxiety will decrease ?Outcome: Progressing ?  ?Problem: Elimination: ?Goal: Will not experience complications related to bowel motility ?Outcome: Progressing ?Goal: Will not experience complications related to urinary retention ?Outcome: Progressing ?  ?Problem: Pain Managment: ?Goal: General experience of comfort will improve ?Outcome: Progressing ?  ?Problem: Safety: ?Goal: Ability to remain free from injury will improve ?Outcome: Progressing ?  ?Problem: Skin Integrity: ?Goal: Risk for impaired skin integrity will decrease ?Outcome: Progressing ?  ?Problem: Activity: ?Goal: Risk for activity intolerance will decrease ?Outcome: Progressing ?  ?Problem: Pain Managment: ?Goal: General experience of comfort will improve ?Outcome: Progressing ?  ?

## 2022-01-29 NOTE — Progress Notes (Signed)
Physical Therapy Treatment ?Patient Details ?Name: Anita Greene ?MRN: [id number] ?DOB: May 25, 1951 ?Today's Date: 01/29/2022 ? ? ?History of Present Illness LORI POPOWSKI is a 71 y.o. female   with past medical history of low back pain, fibromyalgia, diabetes, CKD  nephritis who follows with nephrology, frequent UTIs who presents with generalized weakness and worsening kidney function. ? ?  ?PT Comments  ? ? Physical Therapy Session completed this date. Patient tolerated session fairly well and was agreeable to treatment. Upon arrival into room patient was supine with HOB slightly elevated and a cold towel placed on her forehead with reports of nausea. No pain reported. Patient was able to complete supine to sit at supervision with no physical assistance, and mild use of bed rails. X2 sit to stands from EOB completed with RW at SBA. Patient stood for ~1 minute before sitting back down due to lightheadedness and nausea. BP was taken. 128/29mHg. Patient then proceeded to ambulate from room to therapy gym and ascend/descend 4 steps with B hand rail support at SBA. Patient required an additional seated rest break following stairs due to nausea for ~3 minutes. Patient ambulated about half way back to her room before requesting to sit due to nausea. Patient was rolled back to her room in her recliner and was left in recliner with all needs met. Patient would continue to benefit from skilled physical therapy in order to optimize patient's return to PLOF. Continue to recommend STR upon discharge from acute hospitalization.  ? ?   ?Recommendations for follow up therapy are one component of a multi-disciplinary discharge planning process, led by the attending physician.  Recommendations may be updated based on patient status, additional functional criteria and insurance authorization. ? ?Follow Up Recommendations ? Skilled nursing-short term rehab (<3 hours/day) ?  ?  ?Assistance Recommended at Discharge Frequent or constant  Supervision/Assistance  ?Patient can return home with the following A little help with walking and/or transfers;A little help with bathing/dressing/bathroom;Assistance with cooking/housework ?  ?Equipment Recommendations ? Rolling walker (2 wheels)  ?  ?Recommendations for Other Services   ? ? ?  ?Precautions / Restrictions Precautions ?Precautions: Fall ?Restrictions ?Weight Bearing Restrictions: No  ?  ? ?Mobility ? Bed Mobility ?Overal bed mobility: Needs Assistance ?Bed Mobility: Supine to Sit ?  ?  ?Supine to sit: Supervision ?  ?  ?General bed mobility comments: use of bed rails to complete bed transfer ?Patient Response: Anxious, Cooperative ? ?Transfers ?Overall transfer level: Needs assistance ?Equipment used: Rolling walker (2 wheels) ?Transfers: Sit to/from Stand ?Sit to Stand: Min guard (SBA) ?  ?  ?  ?  ?  ?General transfer comment: Much more stability today ?  ? ?Ambulation/Gait ?Ambulation/Gait assistance: Supervision (SBA) ?Gait Distance (Feet): 120 Feet ?Assistive device: Rolling walker (2 wheels) ?Gait Pattern/deviations: Step-through pattern, Decreased step length - right, Decreased step length - left, Decreased stride length, Narrow base of support, Trunk flexed ?Gait velocity: decreased ?  ?  ?General Gait Details: Patient able to ambulate from room>therapy gym>1/2 way back to room before requesting to sit due to significant nausea ? ? ?Stairs ?  ?  ?  ?  ?  ? ? ?Wheelchair Mobility ?  ? ?Modified Rankin (Stroke Patients Only) ?  ? ? ?  ?Balance Overall balance assessment: Needs assistance ?Sitting-balance support: No upper extremity supported, Feet supported ?Sitting balance-Leahy Scale: Good ?  ?  ?Standing balance support: Bilateral upper extremity supported, During functional activity ?Standing balance-Leahy Scale: Fair ?Standing balance comment:  Pt requires UE support on RW to keep balance in standing. ?  ?  ?  ?  ?  ?  ?  ?  ?  ?  ?  ?  ? ?  ?Cognition Arousal/Alertness:  Awake/alert ?Behavior During Therapy: [facility] for tasks assessed/performed ?Overall Cognitive Status: Within Functional Limits for tasks assessed ?  ?  ?  ?  ?  ?  ?  ?  ?  ?  ?  ?  ?  ?  ?  ?  ?  ?  ?  ? ?  ?Exercises   ? ?  ?General Comments General comments (skin integrity, edema, etc.): BP: 128/69, SpO2 remained >90% throughout session, HR ranged from 91-116bpm ?  ?  ? ?Pertinent Vitals/Pain Pain Assessment ?Pain Assessment: 0-10 ?Pain Score: 0-No pain ?Pain Location: No pain however increase nausea ?Pain Intervention(s): Monitored during session, Limited activity within patient's tolerance, Repositioned  ? ? ?Home Living   ?  ?  ?  ?  ?  ?  ?  ?  ?  ?   ?  ?Prior Function    ?  ?  ?   ? ?PT Goals (current goals can now be found in the care plan section) Acute Rehab PT Goals ?Patient Stated Goal: to get stronger and go home. ?PT Goal Formulation: With patient ?Time For Goal Achievement: 02/05/22 ?Potential to Achieve Goals: Fair ?Progress towards PT goals: Progressing toward goals ? ?  ?Frequency ? ? ? Min 2X/week ? ? ? ?  ?PT Plan Current plan remains appropriate  ? ? ?Co-evaluation   ?  ?  ?  ?  ? ?  ?AM-PAC PT "6 Clicks" Mobility   ?Outcome Measure ? Help needed turning from your back to your side while in a flat bed without using bedrails?: A Little ?Help needed moving from lying on your back to sitting on the side of a flat bed without using bedrails?: A Little ?Help needed moving to and from a bed to a chair (including a wheelchair)?: A Little ?Help needed standing up from a chair using your arms (e.g., wheelchair or bedside chair)?: A Little ?Help needed to walk in hospital room?: A Little ?Help needed climbing 3-5 steps with a railing? : A Little ?6 Click Score: 18 ? ?  ?End of Session Equipment Utilized During Treatment: Gait belt ?Activity Tolerance: Patient tolerated treatment well;Other (comment) (limited by nausea) ?Patient left: in chair;with call bell/phone within reach;with chair alarm set ?Nurse  Communication: Mobility status ?PT Visit Diagnosis: Unsteadiness on feet (R26.81);Other abnormalities of gait and mobility (R26.89);Repeated falls (R29.6);Muscle weakness (generalized) (M62.81);History of falling (Z91.81);Difficulty in walking, not elsewhere classified (R26.2) ?  ? ? ?Time: 1423-9532 ?PT Time Calculation (min) (ACUTE ONLY): 22 min ? ?Charges:  $Gait Training: 8-22 mins          ?          ? ?Iva Boop, PT  ?01/29/22. 12:03 PM ? ? ?

## 2022-01-29 NOTE — TOC Progression Note (Signed)
Transition of Care (TOC) - Progression Note  ? ? ?Patient Details  ?Name: Anita Greene ?MRN: [id number] ?Date of Birth: 1951-06-06 ? ?Transition of Care (TOC) CM/SW Contact  ?Conception Oms, RN ?Phone Number: ?01/29/2022, 2:04 PM ? ?Clinical Narrative:    ?Has chair time Terrilee Croak MWF 1130 can start tomorrow ?  ?  ? ?Expected Discharge Plan and Services ?  ?  ?  ?  ?  ?                ?  ?  ?  ?  ?  ?  ?  ?  ?  ?  ? ? ?Social Determinants of Health (SDOH) Interventions ?  ? ?Readmission Risk Interventions ?   ? View : No data to display.  ?  ?  ?  ? ? ?

## 2022-01-29 NOTE — Progress Notes (Signed)
[facility] ? ? ?PATIENT NAME: Anita Greene   ? ?MR#:  [id number] ? ?DATE OF BIRTH:  1950-12-01 ? ?SUBJECTIVE:  ?intermittent mild nausea. Tolerating PO diet. Ambulating with physical therapy using walker ? ? ?VITALS:  ?Blood pressure 123/65, pulse 97, temperature 98.4 ?F (36.9 ?C), resp. rate 17, height '5\' 1"'$  (1.549 m), weight 57.6 kg, SpO2 100 %. ? ?PHYSICAL EXAMINATION:  ? ?GENERAL:  71 y.o.-year-old patient lying in the bed with no acute distress. Right IJ HD access + ?LUNGS: Normal breath sounds bilaterally, no wheezing, rales, rhonchi.  ?CARDIOVASCULAR: S1, S2 normal. No murmurs, rubs, or gallops.  ?ABDOMEN: Soft, nontender, nondistended. Bowel sounds present.  ?EXTREMITIES: No  edema b/l.    ?NEUROLOGIC: nonfocal  patient is alert and awake ?SKIN: No obvious rash, lesion, or ulcer.  ? ?LABORATORY PANEL:  ?CBC ?Recent Labs  ?Lab 01/27/22 ?0400  ?WBC 11.3*  ?HGB 9.5*  ?HCT 29.0*  ?PLT 261  ? ? ? ?Chemistries  ?Recent Labs  ?Lab 01/27/22 ?0400  ?NA 137  ?K 4.1  ?CL 97*  ?CO2 27  ?GLUCOSE 97  ?BUN 19  ?CREATININE 3.58*  ?CALCIUM 9.0  ?MG 1.9  ? ? ? ?Assessment and Plan ?Hospital Course: ?Anita Greene is a 71 yo female with PMH CKD3, GERD, B12 deficiency, HTN, IDA, OSA, RLS, CVA, DMII who presented with progressively worsening fatigue/malaise, lower extremity weakness, and hand tremors.  She is followed closely outpatient by nephrology and appears to have had rapidly progressive worsening renal function.  Per nephrology, renal biopsy October 2022 showed arterionephrosclerosis, severe global glomerulosclerosis, interstitial fibrosis. ?She was admitted to have dialysis initiated inpatient.  She underwent PermCath placement followed by initial dialysis session on 01/23/2022. ? ?End-stage renal disease now on dialysis-- new ?-- patient CKD progress to end-stage renal disease ?-- right perm cath placed and dialysis initiated. Patient awaiting outpatient chair time and then will  discharged to [facility] ?-- potassium stable ? ?anemia of chronic disease ?-- appears mixed etiology with history of iron deficiency and worsening renal function ?-- patient is status post one unit blood transfusion on 30th March ?-- iron studies stable ? ?physical deconditioning/generalized weakness ?-- physical therapy recommends rehab ? ?Hypertension ?-- BP meds held for now due to intermittent hypotension ? ?obstructive sleep apnea ?-- continue CPAP ? ?history of CVA ?-- on aspirin ? ?type II diabetes with renal manifestations, controlled ?-- continue sliding scale ? ? ?Procedures: Right IJ HD access ?Family communication :none ?Consults :nephrology ?CODE STATUS: FULL ?DVT Prophylaxis :heparin ?Level of care: Med-Surg ?Status is: Inpatient ?Remains inpatient appropriate because: patient is medically best at baseline for discharge.  ?Barrier to discharge getting financial paperwork for  outpatient HD  ?anticipate discharge tomorrow to [facility]. ? ?TOTAL TIME TAKING CARE OF THIS PATIENT: 25 minutes.  ?>50% time spent on counselling and coordination of care ? ?Note: This dictation was prepared with Dragon dictation along with smaller phrase technology. Any transcriptional errors that result from this process are unintentional. ? ?Fritzi Mandes M.D  ? ? ?[facility]  ? ?CC: ?Primary care physician; [place] Pink, MD  ?

## 2022-01-29 NOTE — Progress Notes (Signed)
Placement in progress: Clinic patient originally wanted is at capacity ([place]), [facility] admission set patient up at a clinic in [place]. However, [place] and transportation is a concern. Spoke with patient about [facility], which was second choice. Patient is agreeable to this clinic. Spoke with CNM at [facility], they can accept patient for tomorrow as long as patient can get financially cleared.  ?

## 2022-01-30 ENCOUNTER — Other Ambulatory Visit: Payer: Self-pay | Admitting: Internal Medicine

## 2022-01-30 DIAGNOSIS — G43909 Migraine, unspecified, not intractable, without status migrainosus: Secondary | ICD-10-CM | POA: Diagnosis not present

## 2022-01-30 DIAGNOSIS — G2581 Restless legs syndrome: Secondary | ICD-10-CM | POA: Insufficient documentation

## 2022-01-30 DIAGNOSIS — G4733 Obstructive sleep apnea (adult) (pediatric): Secondary | ICD-10-CM | POA: Diagnosis not present

## 2022-01-30 DIAGNOSIS — I69351 Hemiplegia and hemiparesis following cerebral infarction affecting right dominant side: Secondary | ICD-10-CM | POA: Insufficient documentation

## 2022-01-30 DIAGNOSIS — M6281 Muscle weakness (generalized): Secondary | ICD-10-CM | POA: Diagnosis not present

## 2022-01-30 DIAGNOSIS — E1129 Type 2 diabetes mellitus with other diabetic kidney complication: Secondary | ICD-10-CM | POA: Diagnosis not present

## 2022-01-30 DIAGNOSIS — D510 Vitamin B12 deficiency anemia due to intrinsic factor deficiency: Secondary | ICD-10-CM | POA: Diagnosis not present

## 2022-01-30 DIAGNOSIS — Q2112 Patent foramen ovale: Secondary | ICD-10-CM | POA: Diagnosis not present

## 2022-01-30 DIAGNOSIS — F064 Anxiety disorder due to known physiological condition: Secondary | ICD-10-CM | POA: Diagnosis not present

## 2022-01-30 DIAGNOSIS — R1312 Dysphagia, oropharyngeal phase: Secondary | ICD-10-CM | POA: Diagnosis not present

## 2022-01-30 DIAGNOSIS — K219 Gastro-esophageal reflux disease without esophagitis: Secondary | ICD-10-CM

## 2022-01-30 DIAGNOSIS — F32A Depression, unspecified: Secondary | ICD-10-CM | POA: Diagnosis not present

## 2022-01-30 DIAGNOSIS — I69314 Frontal lobe and executive function deficit following cerebral infarction: Secondary | ICD-10-CM | POA: Diagnosis not present

## 2022-01-30 DIAGNOSIS — Z7982 Long term (current) use of aspirin: Secondary | ICD-10-CM | POA: Diagnosis not present

## 2022-01-30 DIAGNOSIS — I952 Hypotension due to drugs: Secondary | ICD-10-CM | POA: Diagnosis not present

## 2022-01-30 DIAGNOSIS — D638 Anemia in other chronic diseases classified elsewhere: Secondary | ICD-10-CM | POA: Diagnosis not present

## 2022-01-30 DIAGNOSIS — I120 Hypertensive chronic kidney disease with stage 5 chronic kidney disease or end stage renal disease: Secondary | ICD-10-CM | POA: Diagnosis not present

## 2022-01-30 DIAGNOSIS — Z992 Dependence on renal dialysis: Secondary | ICD-10-CM | POA: Insufficient documentation

## 2022-01-30 DIAGNOSIS — E1149 Type 2 diabetes mellitus with other diabetic neurological complication: Secondary | ICD-10-CM | POA: Diagnosis not present

## 2022-01-30 DIAGNOSIS — F419 Anxiety disorder, unspecified: Secondary | ICD-10-CM | POA: Diagnosis not present

## 2022-01-30 DIAGNOSIS — F5101 Primary insomnia: Secondary | ICD-10-CM | POA: Diagnosis not present

## 2022-01-30 DIAGNOSIS — H4720 Unspecified optic atrophy: Secondary | ICD-10-CM | POA: Diagnosis not present

## 2022-01-30 DIAGNOSIS — J309 Allergic rhinitis, unspecified: Secondary | ICD-10-CM | POA: Diagnosis not present

## 2022-01-30 DIAGNOSIS — I10 Essential (primary) hypertension: Secondary | ICD-10-CM | POA: Diagnosis not present

## 2022-01-30 DIAGNOSIS — I6789 Other cerebrovascular disease: Secondary | ICD-10-CM | POA: Diagnosis not present

## 2022-01-30 DIAGNOSIS — K580 Irritable bowel syndrome with diarrhea: Secondary | ICD-10-CM | POA: Diagnosis not present

## 2022-01-30 DIAGNOSIS — E8721 Acute metabolic acidosis: Secondary | ICD-10-CM | POA: Diagnosis not present

## 2022-01-30 DIAGNOSIS — E1122 Type 2 diabetes mellitus with diabetic chronic kidney disease: Secondary | ICD-10-CM | POA: Diagnosis not present

## 2022-01-30 DIAGNOSIS — D631 Anemia in chronic kidney disease: Secondary | ICD-10-CM | POA: Diagnosis not present

## 2022-01-30 DIAGNOSIS — M797 Fibromyalgia: Secondary | ICD-10-CM | POA: Diagnosis not present

## 2022-01-30 DIAGNOSIS — D508 Other iron deficiency anemias: Secondary | ICD-10-CM | POA: Diagnosis not present

## 2022-01-30 DIAGNOSIS — I69391 Dysphagia following cerebral infarction: Secondary | ICD-10-CM | POA: Diagnosis not present

## 2022-01-30 DIAGNOSIS — D509 Iron deficiency anemia, unspecified: Secondary | ICD-10-CM | POA: Diagnosis not present

## 2022-01-30 DIAGNOSIS — F39 Unspecified mood [affective] disorder: Secondary | ICD-10-CM | POA: Diagnosis not present

## 2022-01-30 DIAGNOSIS — N390 Urinary tract infection, site not specified: Secondary | ICD-10-CM | POA: Diagnosis not present

## 2022-01-30 DIAGNOSIS — K5909 Other constipation: Secondary | ICD-10-CM | POA: Diagnosis not present

## 2022-01-30 DIAGNOSIS — Z7984 Long term (current) use of oral hypoglycemic drugs: Secondary | ICD-10-CM | POA: Diagnosis not present

## 2022-01-30 DIAGNOSIS — N186 End stage renal disease: Secondary | ICD-10-CM | POA: Diagnosis not present

## 2022-01-30 DIAGNOSIS — I959 Hypotension, unspecified: Secondary | ICD-10-CM | POA: Diagnosis not present

## 2022-01-30 MED ORDER — MIDODRINE HCL 5 MG PO TABS
ORAL_TABLET | ORAL | Status: AC
  Filled 2022-01-30: qty 2

## 2022-01-30 MED ORDER — NEPRO/CARBSTEADY PO LIQD: 237.0000 mL | Freq: Every day | ORAL | 0 refills | Status: DC

## 2022-01-30 MED ORDER — ZOLPIDEM TARTRATE 10 MG PO TABS: 10.0000 mg | ORAL_TABLET | Freq: Every evening | ORAL | 0 refills | Status: DC | PRN

## 2022-01-30 MED ORDER — HEPARIN SODIUM (PORCINE) 1000 UNIT/ML IJ SOLN
INTRAMUSCULAR | Status: AC
  Filled 2022-01-30: qty 10

## 2022-01-30 NOTE — Discharge Instructions (Signed)
HD chair time Terrilee Croak MWF 1130  ?

## 2022-01-30 NOTE — Discharge Summary (Signed)
?Physician Discharge Summary ?  ?Patient: Anita Greene MRN: [id number] DOB: Mar 30, 1951  ?Admit date:     01/21/2022  ?Discharge date: 01/30/22  ?Discharge Physician: Fritzi Mandes  ? ?PCP: Maryland Pink, MD  ? ?Recommendations at discharge:  ? ?dialysis at [facility] Monday Tuesday Wednesday at 11:30 AM ?follow-up PCP in 1 to 2 weeks Dr. Kary Kos ? ? ?Discharge Diagnoses: ?End-stage renal disease now started on dialysis ? ?Hospital Course: ?Anita Greene is a 71 yo female with PMH CKD3, GERD, B12 deficiency, HTN, IDA, OSA, RLS, CVA, DMII who presented with progressively worsening fatigue/malaise, lower extremity weakness, and hand tremors.  She is followed closely outpatient by nephrology and appears to have had rapidly progressive worsening renal function.  Per nephrology, renal biopsy October 2022 showed arterionephrosclerosis, severe global glomerulosclerosis, interstitial fibrosis. ?She was admitted to have dialysis initiated inpatient.  She underwent PermCath placement followed by initial dialysis session on 01/23/2022. ?  ?End-stage renal disease now on dialysis-- new ?-- patient CKD progress to end-stage renal disease ?-- right perm cath placed and dialysis initiated ?--ptbwill discharged to [facility] ?-- potassium stable ?-- patient has chair time at [facility] Monday Wednesday Friday 1130 ?  ?anemia of chronic disease ?-- appears mixed etiology with history of iron deficiency and worsening renal function ?-- patient is status post one unit blood transfusion on 30th March ?-- iron studies stable ?  ?physical deconditioning/generalized weakness ?-- physical therapy recommends rehab ?  ?Hypertension ?-- BP meds held for now due to intermittent hypotension ?  ?obstructive sleep apnea ?-- continue CPAP ?  ?history of CVA ?-- on aspirin ?  ?type II diabetes with renal manifestations, controlled ?-- continue sliding scale ?  ?  ?Procedures: Right IJ HD access ?Family communication :none ?Consults :nephrology ?CODE  STATUS: FULL ?DVT Prophylaxis :heparin ? ?  ? ? ?Consultants: nephrology, vascular  ?Disposition: Skilled nursing facility ?Diet recommendation:  ?Discharge Diet Orders (From admission, onward)  ? ?  Start     Ordered  ? 01/30/22 0000  Diet - low sodium heart healthy       ? 01/30/22 0940  ? ?  ?  ? ?  ? ?Renal diet ?DISCHARGE MEDICATION: ?Allergies as of 01/30/2022   ? ?   Reactions  ? Cefditoren Pivoxil   ? Other reaction(s): Other (See Comments) ?GI issues, severe abdominal pain  ? Clarithromycin   ? Other reaction(s): Other (See Comments) ?GI issues  ? Codeine Sulfate Other (See Comments)  ? Erythromycin Other (See Comments)  ? Other Other (See Comments)  ? Oral iron  ? Pseudoephedrine   ? ams  ? Sulfa Antibiotics Itching  ? ?  ? ?  ?Medication List  ?  ? ?STOP taking these medications   ? ?ALPRAZolam 0.5 MG tablet ?Commonly known as: Duanne Moron ?  ?amoxicillin 500 MG tablet ?Commonly known as: AMOXIL ?  ?gentamicin ointment 0.1 % ?Commonly known as: GARAMYCIN ?  ?lisinopril-hydrochlorothiazide 20-25 MG tablet ?Commonly known as: ZESTORETIC ?  ?nystatin-triamcinolone cream ?Commonly known as: MYCOLOG II ?  ? ?  ? ?TAKE these medications   ? ?ascorbic acid 500 MG tablet ?Commonly known as: VITAMIN C ?Take 500 mg by mouth daily. ?  ?aspirin EC 81 MG tablet ?Take 81 mg by mouth daily. ?  ?buPROPion 150 MG 24 hr tablet ?Commonly known as: WELLBUTRIN XL ?Take 300 mg by mouth daily. ?  ?busPIRone 15 MG tablet ?Commonly known as: BUSPAR ?Take 15 mg by mouth 3 (three) times daily. ?  ?  calcium acetate 667 MG tablet ?Commonly known as: PHOSLO ?Take 1,334 mg by mouth 3 (three) times daily with meals. ?  ?cetirizine 10 MG tablet ?Commonly known as: ZYRTEC ?Take 1 tablet (10 mg total) by mouth daily. ?  ?diphenoxylate-atropine 2.5-0.025 MG tablet ?Commonly known as: LOMOTIL ?TAKE 1 TABLET BY MOUTH 4 TIMES DAILY AS NEEDED FOR DIARRHEA OR LOOSE STOOLS. ?  ?DULoxetine 30 MG capsule ?Commonly known as: CYMBALTA ?Take 1 capsule (30 mg  total) by mouth daily. ?  ?feeding supplement (NEPRO CARB STEADY) Liqd ?Take 237 mLs by mouth at bedtime. ?  ?ferrous sulfate 325 (65 FE) MG tablet ?Take 325 mg by mouth 2 (two) times daily with a meal. ?  ?gabapentin 300 MG capsule ?Commonly known as: NEURONTIN ?TAKE 1 CAPSULE BY MOUTH 2 TIMES DAILY ?  ?glucose blood test strip ?Commonly known as: FREESTYLE LITE ?USE AS DIRECTED TO CHECK BLOOD SUGAR ONCE DAILY ?  ?midodrine 10 MG tablet ?Commonly known as: PROAMATINE ?Take 10 mg by mouth 3 (three) times daily. ?  ?multivitamin tablet ?Take 1 tablet by mouth daily. ?  ?verapamil 40 MG tablet ?Commonly known as: CALAN ?Take 20 mg by mouth daily. ?What changed: Another medication with the same name was removed. Continue taking this medication, and follow the directions you see here. ?  ?zolpidem 10 MG tablet ?Commonly known as: AMBIEN ?TAKE 1 TABLET BY MOUTH AT BEDTIME AS NEEDED FOR SLEEP ?  ? ?  ? ? Contact information for follow-up providers   ? ? Kris Hartmann, NP Follow up in 2 week(s).   ?Specialty: Vascular Surgery ?Why: with vein mapping ?Contact information: ?[address] ?[place] [address] ?[phone] ? ? ?  ?  ? ? Maryland Pink, MD. Go in 1 week(s).   ?Specialty: Family Medicine ?Why: hopsital f/u ?Contact information: ?[address] ?[facility] ?[place] [address] ?[phone] ? ? ?  ?  ? ?  ?  ? ? Contact information for after-discharge care   ? ? Destination   ? ? [facility] .   ?Service: Skilled Nursing ?Contact information: ?[address] ?[place] ?[phone] ? ?  ?  ? ?  ?  ? ?  ?  ? ?  ? ?Discharge Exam: ?Filed Weights  ? 01/28/22 2426 01/28/22 1237 01/30/22 0610  ?Weight: 57.2 kg 57.6 kg 63 kg  ? ? ? ?Condition at discharge: fair ? ?The results of significant diagnostics from this hospitalization (including imaging, microbiology, ancillary and laboratory) are  listed below for reference.  ? ?Imaging Studies: ?CT ABDOMEN PELVIS WO CONTRAST ? ?Result Date: 01/22/2022 ?CLINICAL DATA:  History of gastric bypass surgery with nausea and vomiting. EXAM: CT ABDOMEN AND PELVIS WITHOUT CONTRAST TECHNIQUE: Multidetector CT imaging of the abdomen and pelvis was performed following the standard protocol without IV contrast. RADIATION DOSE REDUCTION: This exam was performed according to the departmental dose-optimization program which includes automated exposure control, adjustment of the mA and/or kV according to patient size and/or use of iterative reconstruction technique. COMPARISON:  July 14, 2011 FINDINGS: Lower chest: No acute abnormality. Hepatobiliary: No focal liver abnormality is seen. Status post cholecystectomy. The common bile duct measures approximately 9.3 mm in diameter. Pancreas: Unremarkable. No pancreatic ductal dilatation or surrounding inflammatory changes. Spleen: Normal in size without focal abnormality. Adrenals/Urinary Tract: A stable 3.7 cm x 3.3 cm low-attenuation (approximately 1.35 Hounsfield units) left adrenal mass is seen.  The right adrenal gland is normal in appearance. Kidneys are normal in size, without obstructing renal calculi, focal lesion, or hydronephrosis. Bilateral 2 mm and 3 mm nonobstructing renal calculi are seen. Stable,, likely benign approximately 1.9 cm x 1.5 cm, 1.4 cm x 0.6 cm and 1.1 cm x 1.0 cm hyperdense foci are seen along the base of an otherwise normal appearing urinary bladder. Stomach/Bowel: There is a small to moderate sized hiatal hernia. Surgical sutures are seen within the gastric region. Surgically anastomosed bowel is also noted within the anterior aspect of the mid left abdomen. Appendix appears normal. No evidence of bowel wall thickening, distention, or inflammatory changes. Vascular/Lymphatic: Mild aortic atherosclerosis. No enlarged abdominal or pelvic lymph nodes. Reproductive: Uterus and bilateral adnexa are  unremarkable. Other: No abdominal wall hernia or abnormality. No abdominopelvic ascites. Musculoskeletal: No acute or significant osseous findings. IMPRESSION: 1. Bilateral 2 mm and 3 mm nonobstructin

## 2022-01-30 NOTE — Progress Notes (Signed)
Placement Resolved: [facility] MWF 11:30am. Start date Monday 4/10 at 10:45am. Patient and son notified.  ?

## 2022-01-30 NOTE — Care Management Important Message (Signed)
Important Message ? ?Patient Details  ?Name: Anita Greene ?MRN: [id number] ?Date of Birth: 1951/03/19 ? ? ?Medicare Important Message Given:  Yes ? ? ? ? ?Juliann Pulse A Farren Landa ?01/30/2022, 11:21 AM ?

## 2022-01-30 NOTE — TOC Progression Note (Addendum)
Transition of Care (TOC) - Progression Note  ? ? ?Patient Details  ?Name: Anita Greene ?MRN: [id number] ?Date of Birth: 08-10-1951 ? ?Transition of Care (TOC) CM/SW Contact  ?Conception Oms, RN ?Phone Number: ?01/30/2022, 9:50 AM ? ?Clinical Narrative:   Spoke with the patient and let her know that she will be discharged to [facility] after dialysis today, She notified her family, her son will transport her ? ? ? ?  ?  ? ?Expected Discharge Plan and Services ?  ?  ?  ?  ?  ?Expected Discharge Date: 01/30/22               ?  ?  ?  ?  ?  ?  ?  ?  ?  ?  ? ? ?Social Determinants of Health (SDOH) Interventions ?  ? ?Readmission Risk Interventions ?   ? View : No data to display.  ?  ?  ?  ? ? ?

## 2022-01-30 NOTE — Progress Notes (Signed)
[facility] ?[place], [place] ?01/30/22 ? ?Subjective:  ? ?Hospital day # 9 ? ?Patient known to our practice from outpatient follow-up. ?She presents to the emergency room for not feeling well for the past 2 to 3 weeks.  She had lower extremity weakness and states that she was not able to walk even to 3 steps. ? ?Update: ? ?Patient seen and evaluated during dialysis treatment.  ?Did become hypotensive earlier in the treatment.  ?Doing better now.  ? ? ?Objective:  ?Vital signs in last 24 hours:  ?Temp:  [97.6 ?F (36.4 ?C)-98.8 ?F (37.1 ?C)] 97.9 ?F (36.6 ?C) (04/07 0957) ?Pulse Rate:  [84-108] 91 (04/07 1300) ?Resp:  [14-38] 21 (04/07 1300) ?BP: (73-136)/(44-102) 116/102 (04/07 1300) ?SpO2:  [98 %-100 %] 100 % (04/07 1300) ?Weight:  [58 kg-63 kg] 58 kg (04/07 0957) ? ?Weight change: 5.8 kg ?Filed Weights  ? 01/28/22 1237 01/30/22 0610 01/30/22 0957  ?Weight: 57.6 kg 63 kg 58 kg  ? ? ?Intake/Output: ?  ? ?Intake/Output Summary (Last 24 hours) at 01/30/2022 1312 ?Last data filed at 01/29/2022 1416 ?Gross per 24 hour  ?Intake 240 ml  ?Output --  ?Net 240 ml  ? ? ? ?Physical Exam: ?General:  No acute distress  ?HEENT  anicteric, moist oral mucous membrane  ?Pulm/lungs  normal breathing effort, lungs are clear to auscultation  ?CVS/Heart  regular rhythm, no rub or gallop  ?Abdomen:   Soft, nontender  ?Extremities:  No peripheral edema  ?Neurologic:  Alert, oriented, able to follow commands  ?Skin:  No acute rashes  ?Access: Rt Permcath ? ?Basic Metabolic Panel: ? ?Recent Labs  ?Lab 01/24/22 ?0605 01/25/22 ?0510 01/26/22 ?1771 01/27/22 ?0400  ?NA 136 138 135 137  ?K 3.4* 3.2* 3.6 4.1  ?CL 99 100 99 97*  ?CO2 '25 28 26 27  '$ ?GLUCOSE 96 119* 93 97  ?BUN 58* 32* 37* 19  ?CREATININE 5.78* 4.05* 5.57* 3.58*  ?CALCIUM 7.9* 8.6* 8.7* 9.0  ?MG 1.7 1.8 1.8 1.9  ?PHOS 5.7* 4.2 3.4 2.6  ? ? ? ? ?CBC: ?Recent Labs  ?Lab 01/24/22 ?0605 01/25/22 ?0510 01/26/22 ?1657 01/27/22 ?0400  ?WBC 10.0 11.6* 11.6* 11.3*  ?NEUTROABS  6.6 7.5 6.9 6.8  ?HGB 9.1* 9.0* 7.7* 9.5*  ?HCT 26.3* 27.1* 24.4* 29.0*  ?MCV 87.1 88.0 92.4 91.8  ?PLT 275 269 272 261  ? ? ? ?  ?Lab Results  ?Component Value Date  ? HEPBSAG NON REACTIVE 01/23/2022  ? HEPBSAB NON REACTIVE 01/23/2022  ? ? ? ? ?Microbiology: ? ?Recent Results (from the past 240 hour(s))  ?Resp Panel by RT-PCR (Flu A&B, Covid) Nasopharyngeal Swab     Status: None  ? Collection Time: 01/21/22  3:47 PM  ? Specimen: Nasopharyngeal Swab; Nasopharyngeal(NP) swabs in vial transport medium  ?Result Value Ref Range Status  ? SARS Coronavirus 2 by RT PCR NEGATIVE NEGATIVE Final  ?  Comment: (NOTE) ?SARS-CoV-2 target nucleic acids are NOT DETECTED. ? ?The SARS-CoV-2 RNA is generally detectable in upper respiratory ?specimens during the acute phase of infection. The lowest ?concentration of SARS-CoV-2 viral copies this assay can detect is ?138 copies/mL. A negative result does not preclude SARS-Cov-2 ?infection and should not be used as the sole basis for treatment or ?other patient management decisions. A negative result may occur with  ?improper specimen collection/handling, submission of specimen other ?than nasopharyngeal swab, presence of viral mutation(s) within the ?areas targeted by this assay, and inadequate number of viral ?copies(<138 copies/mL). A negative  result must be combined with ?clinical observations, patient history, and epidemiological ?information. The expected result is Negative. ? ?Fact Sheet for Patients:  ?EntrepreneurPulse.com.au ? ?Fact Sheet for Healthcare Providers:  ?IncredibleEmployment.be ? ?This test is no t yet approved or cleared by the [place] FDA and  ?has been authorized for detection and/or diagnosis of SARS-CoV-2 by ?FDA under an Emergency Use Authorization (EUA). This EUA will remain  ?in effect (meaning this test can be used) for the duration of the ?COVID-19 declaration under Section 564(b)(1) of the Act, 21 ?U.S.C.section  360bbb-3(b)(1), unless the authorization is terminated  ?or revoked sooner.  ? ? ?  ? Influenza A by PCR NEGATIVE NEGATIVE Final  ? Influenza B by PCR NEGATIVE NEGATIVE Final  ?  Comment: (NOTE) ?The Xpert Xpress SARS-CoV-2/FLU/RSV plus assay is intended as an aid ?in the diagnosis of influenza from Nasopharyngeal swab specimens and ?should not be used as a sole basis for treatment. Nasal washings and ?aspirates are unacceptable for Xpert Xpress SARS-CoV-2/FLU/RSV ?testing. ? ?Fact Sheet for Patients: ?EntrepreneurPulse.com.au ? ?Fact Sheet for Healthcare Providers: ?IncredibleEmployment.be ? ?This test is not yet approved or cleared by the [place] FDA and ?has been authorized for detection and/or diagnosis of SARS-CoV-2 by ?FDA under an Emergency Use Authorization (EUA). This EUA will remain ?in effect (meaning this test can be used) for the duration of the ?COVID-19 declaration under Section 564(b)(1) of the Act, 21 U.S.C. ?section 360bbb-3(b)(1), unless the authorization is terminated or ?revoked. ? ?Performed at [facility], [place], ?[place] [address] ?  ? ? ?Coagulation Studies: ?No results for input(s): LABPROT, INR in the last 72 hours. ? ?Urinalysis: ?No results for input(s): COLORURINE, LABSPEC, [place], GLUCOSEU, HGBUR, BILIRUBINUR, KETONESUR, PROTEINUR, UROBILINOGEN, NITRITE, LEUKOCYTESUR in the last 72 hours. ? ?Invalid input(s): APPERANCEUR ?  ? ? ?Imaging: ?No results found. ? ? ?Medications:  ? ? sodium chloride    ? sodium chloride    ? ? aspirin EC  81 mg Oral QHS  ? buPROPion  300 mg Oral Daily  ? busPIRone  15 mg Oral TID  ? calcium acetate  1,334 mg Oral TID WC  ? Chlorhexidine Gluconate Cloth  6 each Topical Q0600  ? darbepoetin (ARANESP) injection - NON-DIALYSIS  100 mcg Subcutaneous Q Thu-1800  ? DULoxetine  30 mg Oral Daily  ? feeding supplement (NEPRO CARB STEADY)  237 mL Oral QHS  ? ferrous sulfate  325 mg Oral BID WC  ?  gabapentin  100 mg Oral QPM  ? heparin injection (subcutaneous)  5,000 Units Subcutaneous Q12H  ? heparin sodium (porcine)      ? midodrine      ? multivitamin  1 tablet Oral QHS  ? rOPINIRole  0.25 mg Oral QHS  ? sodium chloride flush  3 mL Intravenous Q12H  ? ?sodium chloride, sodium chloride, acetaminophen **OR** acetaminophen, albuterol, alteplase, heparin, lidocaine (PF), lidocaine-prilocaine, midodrine, ondansetron, pentafluoroprop-tetrafluoroeth, zolpidem ? ?Assessment/ Plan:  ?71 y.o. female with fibromyalgia, chronic low back pain, history of anxiety and depression, history of stroke in 2009, diabetes 2009, obstructive sleep apnea, right hearing loss, history of kidney stones, frequent UTIs, history of bladder tack, history of nonsteroidal use-meloxicam, gastric bypass surgery in June 2020    admitted on 01/21/2022 for ?Weakness [R53.1] ?Acute renal failure (ARF) (HCC) [N17.9] ?Acute renal failure superimposed on chronic kidney disease, unspecified CKD stage, unspecified acute renal failure type ([place]) [N17.9, N18.9] ? ?#End-stage renal disease with hypokalemia ?Patient's renal failure has progressed  to end-stage renal disease. ?CKD risk factors include diabetes, hypertension, kidney stones, frequent UTIs, history of nonsteroidal use, IV contrast exposure, atherosclerosis and advanced age. ?Patient had a renal biopsy in October 2022 which was consistent with arterionephrosclerosis, severe global glomerulosclerosis, interstitial fibrosis all features consistent with diabetic, sclerosis.  Calcium oxalate crystals were also noted. ?-Rt permcath placed by vascular on 01/23/22. Dialysis initiated after placement.  ?-Accepted rehab bed at [facility]. Outpaient dialysis clinic confirmed at [facility] and patient able to start tomorrow. Patient cleared to discharge from renal stance ? ?Plan ?-Pt seen and evaluated during HD, tolerating well at the moment, was hypotensive earlier in treatment but has now  recovered.  Complete HD treatment today.   ? ? ?#Anemia of chronic kidney disease, severe ?Continue Aranesp weekly ?Hgb up to 9.5.   ? ?#Acute metabolic acidosis ?Bicarb at 27 and acceptable.  Improved with dia

## 2022-01-30 NOTE — Progress Notes (Unsigned)
Ordered pt's ambien for discharge after she left to [facility] ?

## 2022-02-02 DIAGNOSIS — D631 Anemia in chronic kidney disease: Secondary | ICD-10-CM | POA: Diagnosis not present

## 2022-02-02 DIAGNOSIS — D509 Iron deficiency anemia, unspecified: Secondary | ICD-10-CM | POA: Diagnosis not present

## 2022-02-02 DIAGNOSIS — Z992 Dependence on renal dialysis: Secondary | ICD-10-CM | POA: Diagnosis not present

## 2022-02-02 DIAGNOSIS — N186 End stage renal disease: Secondary | ICD-10-CM | POA: Diagnosis not present

## 2022-02-03 DIAGNOSIS — G4733 Obstructive sleep apnea (adult) (pediatric): Secondary | ICD-10-CM | POA: Diagnosis not present

## 2022-02-03 DIAGNOSIS — M6281 Muscle weakness (generalized): Secondary | ICD-10-CM | POA: Diagnosis not present

## 2022-02-03 DIAGNOSIS — D508 Other iron deficiency anemias: Secondary | ICD-10-CM | POA: Diagnosis not present

## 2022-02-03 DIAGNOSIS — F064 Anxiety disorder due to known physiological condition: Secondary | ICD-10-CM | POA: Diagnosis not present

## 2022-02-03 DIAGNOSIS — N186 End stage renal disease: Secondary | ICD-10-CM | POA: Diagnosis not present

## 2022-02-03 DIAGNOSIS — E1129 Type 2 diabetes mellitus with other diabetic kidney complication: Secondary | ICD-10-CM | POA: Diagnosis not present

## 2022-02-03 DIAGNOSIS — I6789 Other cerebrovascular disease: Secondary | ICD-10-CM | POA: Diagnosis not present

## 2022-02-03 DIAGNOSIS — I10 Essential (primary) hypertension: Secondary | ICD-10-CM | POA: Diagnosis not present

## 2022-02-03 DIAGNOSIS — D510 Vitamin B12 deficiency anemia due to intrinsic factor deficiency: Secondary | ICD-10-CM | POA: Diagnosis not present

## 2022-02-03 DIAGNOSIS — F5101 Primary insomnia: Secondary | ICD-10-CM | POA: Diagnosis not present

## 2022-02-03 DIAGNOSIS — F32A Depression, unspecified: Secondary | ICD-10-CM | POA: Diagnosis not present

## 2022-02-03 DIAGNOSIS — I952 Hypotension due to drugs: Secondary | ICD-10-CM | POA: Diagnosis not present

## 2022-02-06 DIAGNOSIS — I10 Essential (primary) hypertension: Secondary | ICD-10-CM | POA: Diagnosis not present

## 2022-02-06 DIAGNOSIS — F5101 Primary insomnia: Secondary | ICD-10-CM | POA: Diagnosis not present

## 2022-02-06 DIAGNOSIS — I6789 Other cerebrovascular disease: Secondary | ICD-10-CM | POA: Diagnosis not present

## 2022-02-06 DIAGNOSIS — F064 Anxiety disorder due to known physiological condition: Secondary | ICD-10-CM | POA: Diagnosis not present

## 2022-02-06 DIAGNOSIS — M6281 Muscle weakness (generalized): Secondary | ICD-10-CM | POA: Diagnosis not present

## 2022-02-06 DIAGNOSIS — D510 Vitamin B12 deficiency anemia due to intrinsic factor deficiency: Secondary | ICD-10-CM | POA: Diagnosis not present

## 2022-02-06 DIAGNOSIS — I952 Hypotension due to drugs: Secondary | ICD-10-CM | POA: Diagnosis not present

## 2022-02-06 DIAGNOSIS — F32A Depression, unspecified: Secondary | ICD-10-CM | POA: Diagnosis not present

## 2022-02-06 DIAGNOSIS — D508 Other iron deficiency anemias: Secondary | ICD-10-CM | POA: Diagnosis not present

## 2022-02-06 DIAGNOSIS — G4733 Obstructive sleep apnea (adult) (pediatric): Secondary | ICD-10-CM | POA: Diagnosis not present

## 2022-02-06 DIAGNOSIS — N186 End stage renal disease: Secondary | ICD-10-CM | POA: Diagnosis not present

## 2022-02-09 DIAGNOSIS — E1129 Type 2 diabetes mellitus with other diabetic kidney complication: Secondary | ICD-10-CM | POA: Diagnosis not present

## 2022-02-09 DIAGNOSIS — F5101 Primary insomnia: Secondary | ICD-10-CM | POA: Diagnosis not present

## 2022-02-09 DIAGNOSIS — I10 Essential (primary) hypertension: Secondary | ICD-10-CM | POA: Diagnosis not present

## 2022-02-09 DIAGNOSIS — D510 Vitamin B12 deficiency anemia due to intrinsic factor deficiency: Secondary | ICD-10-CM | POA: Diagnosis not present

## 2022-02-09 DIAGNOSIS — G4733 Obstructive sleep apnea (adult) (pediatric): Secondary | ICD-10-CM | POA: Diagnosis not present

## 2022-02-09 DIAGNOSIS — N186 End stage renal disease: Secondary | ICD-10-CM | POA: Diagnosis not present

## 2022-02-09 DIAGNOSIS — M6281 Muscle weakness (generalized): Secondary | ICD-10-CM | POA: Diagnosis not present

## 2022-02-09 DIAGNOSIS — D508 Other iron deficiency anemias: Secondary | ICD-10-CM | POA: Diagnosis not present

## 2022-02-09 DIAGNOSIS — I6789 Other cerebrovascular disease: Secondary | ICD-10-CM | POA: Diagnosis not present

## 2022-02-09 DIAGNOSIS — F064 Anxiety disorder due to known physiological condition: Secondary | ICD-10-CM | POA: Diagnosis not present

## 2022-02-09 DIAGNOSIS — I952 Hypotension due to drugs: Secondary | ICD-10-CM | POA: Diagnosis not present

## 2022-02-13 ENCOUNTER — Ambulatory Visit
Admission: RE | Admit: 2022-02-13 | Discharge: 2022-02-13 | Disposition: A | Discharge status: Home / Self Care | Payer: PPO | Source: Ambulatory Visit | Attending: Student | Admitting: Student

## 2022-02-13 VITALS — BP 146/67 | HR 70 | Temp 98.4°F | Resp 16 | Ht 61.0 in | Wt 127.0 lb

## 2022-02-13 DIAGNOSIS — F32A Depression, unspecified: Secondary | ICD-10-CM | POA: Diagnosis not present

## 2022-02-13 DIAGNOSIS — M6281 Muscle weakness (generalized): Secondary | ICD-10-CM | POA: Diagnosis not present

## 2022-02-13 DIAGNOSIS — F5101 Primary insomnia: Secondary | ICD-10-CM | POA: Diagnosis not present

## 2022-02-13 DIAGNOSIS — N186 End stage renal disease: Secondary | ICD-10-CM | POA: Insufficient documentation

## 2022-02-13 DIAGNOSIS — D510 Vitamin B12 deficiency anemia due to intrinsic factor deficiency: Secondary | ICD-10-CM | POA: Diagnosis not present

## 2022-02-13 DIAGNOSIS — I952 Hypotension due to drugs: Secondary | ICD-10-CM | POA: Diagnosis not present

## 2022-02-13 DIAGNOSIS — E1129 Type 2 diabetes mellitus with other diabetic kidney complication: Secondary | ICD-10-CM | POA: Diagnosis not present

## 2022-02-13 DIAGNOSIS — D508 Other iron deficiency anemias: Secondary | ICD-10-CM | POA: Diagnosis not present

## 2022-02-13 DIAGNOSIS — I10 Essential (primary) hypertension: Secondary | ICD-10-CM | POA: Diagnosis not present

## 2022-02-13 DIAGNOSIS — I6789 Other cerebrovascular disease: Secondary | ICD-10-CM | POA: Diagnosis not present

## 2022-02-13 DIAGNOSIS — G4733 Obstructive sleep apnea (adult) (pediatric): Secondary | ICD-10-CM | POA: Diagnosis not present

## 2022-02-13 DIAGNOSIS — F064 Anxiety disorder due to known physiological condition: Secondary | ICD-10-CM | POA: Diagnosis not present

## 2022-02-13 LAB — PREPARE RBC (CROSSMATCH)

## 2022-02-13 LAB — HEMOGLOBIN: Hemoglobin: 7.1 g/dL — ABNORMAL LOW (ref 12.0–15.0)

## 2022-02-13 MED ORDER — SODIUM CHLORIDE 0.9% IV SOLUTION: Freq: Once | INTRAVENOUS | Status: DC

## 2022-02-13 MED ORDER — SODIUM CHLORIDE FLUSH 0.9 % IV SOLN
INTRAVENOUS | Status: AC
Start: 2022-02-13 — End: 2022-02-13
  Filled 2022-02-13: qty 10

## 2022-02-14 DIAGNOSIS — N186 End stage renal disease: Secondary | ICD-10-CM | POA: Diagnosis not present

## 2022-02-14 DIAGNOSIS — Z992 Dependence on renal dialysis: Secondary | ICD-10-CM | POA: Diagnosis not present

## 2022-02-14 LAB — TYPE AND SCREEN
ABO/RH(D): A POS
Antibody Screen: NEGATIVE
Unit division: 0

## 2022-02-14 LAB — BPAM RBC
Blood Product Expiration Date: 202305052359
ISSUE DATE / TIME: 202304211215
Unit Type and Rh: 6200

## 2022-02-16 ENCOUNTER — Other Ambulatory Visit (INDEPENDENT_AMBULATORY_CARE_PROVIDER_SITE_OTHER): Payer: Self-pay | Admitting: Nurse Practitioner

## 2022-02-16 DIAGNOSIS — E1129 Type 2 diabetes mellitus with other diabetic kidney complication: Secondary | ICD-10-CM | POA: Diagnosis not present

## 2022-02-16 DIAGNOSIS — F32A Depression, unspecified: Secondary | ICD-10-CM | POA: Diagnosis not present

## 2022-02-16 DIAGNOSIS — I10 Essential (primary) hypertension: Secondary | ICD-10-CM | POA: Diagnosis not present

## 2022-02-16 DIAGNOSIS — N186 End stage renal disease: Secondary | ICD-10-CM | POA: Diagnosis not present

## 2022-02-16 DIAGNOSIS — G4733 Obstructive sleep apnea (adult) (pediatric): Secondary | ICD-10-CM | POA: Diagnosis not present

## 2022-02-16 DIAGNOSIS — F064 Anxiety disorder due to known physiological condition: Secondary | ICD-10-CM | POA: Diagnosis not present

## 2022-02-16 DIAGNOSIS — M6281 Muscle weakness (generalized): Secondary | ICD-10-CM | POA: Diagnosis not present

## 2022-02-16 DIAGNOSIS — K219 Gastro-esophageal reflux disease without esophagitis: Secondary | ICD-10-CM | POA: Diagnosis not present

## 2022-02-16 DIAGNOSIS — I6789 Other cerebrovascular disease: Secondary | ICD-10-CM | POA: Diagnosis not present

## 2022-02-16 DIAGNOSIS — F5101 Primary insomnia: Secondary | ICD-10-CM | POA: Diagnosis not present

## 2022-02-16 DIAGNOSIS — I952 Hypotension due to drugs: Secondary | ICD-10-CM | POA: Diagnosis not present

## 2022-02-16 DIAGNOSIS — K5909 Other constipation: Secondary | ICD-10-CM | POA: Diagnosis not present

## 2022-02-17 ENCOUNTER — Encounter (INDEPENDENT_AMBULATORY_CARE_PROVIDER_SITE_OTHER): Payer: Self-pay | Admitting: Nurse Practitioner

## 2022-02-17 ENCOUNTER — Ambulatory Visit (INDEPENDENT_AMBULATORY_CARE_PROVIDER_SITE_OTHER): Payer: PPO

## 2022-02-17 ENCOUNTER — Ambulatory Visit (INDEPENDENT_AMBULATORY_CARE_PROVIDER_SITE_OTHER): Payer: PPO | Admitting: Nurse Practitioner

## 2022-02-17 VITALS — BP 146/77 | HR 81 | Resp 17 | Ht 61.0 in

## 2022-02-17 DIAGNOSIS — E1149 Type 2 diabetes mellitus with other diabetic neurological complication: Secondary | ICD-10-CM

## 2022-02-17 DIAGNOSIS — N186 End stage renal disease: Secondary | ICD-10-CM

## 2022-02-17 DIAGNOSIS — I10 Essential (primary) hypertension: Secondary | ICD-10-CM

## 2022-02-20 DIAGNOSIS — M6281 Muscle weakness (generalized): Secondary | ICD-10-CM | POA: Diagnosis not present

## 2022-02-20 DIAGNOSIS — I6789 Other cerebrovascular disease: Secondary | ICD-10-CM | POA: Diagnosis not present

## 2022-02-20 DIAGNOSIS — N186 End stage renal disease: Secondary | ICD-10-CM | POA: Diagnosis not present

## 2022-02-20 DIAGNOSIS — F5101 Primary insomnia: Secondary | ICD-10-CM | POA: Diagnosis not present

## 2022-02-20 DIAGNOSIS — G4733 Obstructive sleep apnea (adult) (pediatric): Secondary | ICD-10-CM | POA: Diagnosis not present

## 2022-02-20 DIAGNOSIS — I10 Essential (primary) hypertension: Secondary | ICD-10-CM | POA: Diagnosis not present

## 2022-02-20 DIAGNOSIS — E1129 Type 2 diabetes mellitus with other diabetic kidney complication: Secondary | ICD-10-CM | POA: Diagnosis not present

## 2022-02-20 DIAGNOSIS — K5909 Other constipation: Secondary | ICD-10-CM | POA: Diagnosis not present

## 2022-02-20 DIAGNOSIS — F32A Depression, unspecified: Secondary | ICD-10-CM | POA: Diagnosis not present

## 2022-02-20 DIAGNOSIS — K219 Gastro-esophageal reflux disease without esophagitis: Secondary | ICD-10-CM | POA: Diagnosis not present

## 2022-02-20 DIAGNOSIS — F064 Anxiety disorder due to known physiological condition: Secondary | ICD-10-CM | POA: Diagnosis not present

## 2022-02-22 DIAGNOSIS — Z992 Dependence on renal dialysis: Secondary | ICD-10-CM | POA: Diagnosis not present

## 2022-02-22 DIAGNOSIS — N186 End stage renal disease: Secondary | ICD-10-CM | POA: Diagnosis not present

## 2022-02-23 DIAGNOSIS — N186 End stage renal disease: Secondary | ICD-10-CM | POA: Diagnosis not present

## 2022-02-23 DIAGNOSIS — D509 Iron deficiency anemia, unspecified: Secondary | ICD-10-CM | POA: Diagnosis not present

## 2022-02-23 DIAGNOSIS — Z23 Encounter for immunization: Secondary | ICD-10-CM | POA: Diagnosis not present

## 2022-02-23 DIAGNOSIS — D631 Anemia in chronic kidney disease: Secondary | ICD-10-CM | POA: Diagnosis not present

## 2022-02-23 DIAGNOSIS — Z992 Dependence on renal dialysis: Secondary | ICD-10-CM | POA: Diagnosis not present

## 2022-02-27 ENCOUNTER — Telehealth (INDEPENDENT_AMBULATORY_CARE_PROVIDER_SITE_OTHER): Payer: Self-pay

## 2022-02-27 NOTE — Telephone Encounter (Signed)
I attempted to contact the patient to schedule a left brachial cephalic fistula and a message was left for a return call. ?

## 2022-03-01 ENCOUNTER — Encounter (INDEPENDENT_AMBULATORY_CARE_PROVIDER_SITE_OTHER): Payer: Self-pay | Admitting: Nurse Practitioner

## 2022-03-01 NOTE — Progress Notes (Signed)
? ?Subjective:  ? ? Patient ID: Anita Greene, female    DOB: [birth year], 71 y.o.   MRN: [id number] ?No chief complaint on file. ? ? ?The patient is seen for evaluation for dialysis access.  The patient recently had a PermCath placed.  She has been tolerating recent dialysis well. ? ?The patient is followed by nephrology.   ? ?The patient is right-handed. ? ? ?No recent shortening of the patient's walking distance or new symptoms consistent with claudication.  No history of rest pain symptoms. No new ulcers or wounds of the lower extremities have occurred. ? ?The patient denies amaurosis fugax or recent TIA symptoms. There are no recent neurological changes noted. ?There is no history of DVT, PE or superficial thrombophlebitis. ?No recent episodes of angina or shortness of breath documented.   ? ? ?Review of Systems ? ?   ?Objective:  ? Physical Exam ? ?BP (!) 146/77 (BP Location: Left Arm)   Pulse 81   Resp 17   Ht '5\' 1"'$  (1.549 m)   BMI 24.00 kg/m?  ? ?Past Medical History:  ?Diagnosis Date  ? Allergic rhinitis due to pollen   ? Arthritis   ? B12 deficiency   ? Chronic kidney disease   ? Fibromyalgia   ? GERD (gastroesophageal reflux disease)   ? Hemiparesis affecting right side as late effect of cerebrovascular accident (CVA) ([place])   ? Hypertension   ? IBS (irritable bowel syndrome)   ? diarrhea prone  ? Iron deficiency anemia   ? Migraine headache   ? Mood disorder ([place])   ? Obstructive sleep apnea   ? CPAP 11 (auto titrate)  ? Optic atrophy of right eye   ? PFO (patent foramen ovale)   ? RLS (restless legs syndrome)   ? Sleep disturbance   ? Stroke [facility])   ? Type 2 diabetes mellitus with neurological manifestations, controlled ([place])   ? ? ?Social History  ? ?Socioeconomic History  ? Marital status: Widowed  ?  Spouse name: Not on file  ? Number of children: 1  ? Years of education: Not on file  ? Highest education level: Not on file  ?Occupational History  ? Occupation: Manages [employer]  ?Tobacco Use  ?  Smoking status: Never  ? Smokeless tobacco: Never  ?Vaping Use  ? Vaping Use: Never used  ?Substance and Sexual Activity  ? Alcohol use: Never  ?  Alcohol/week: 0.0 standard drinks  ? Drug use: Not on file  ? Sexual activity: Not on file  ?Other Topics Concern  ? Not on file  ?Social History Narrative  ? 1 son died   ? 1 son living--2 grandchildren  ?   ? Had living will--not sure about it  ? No formal health care POA--would want son  ? Would accept resuscitation  ? No prolonged tube feeds if cognitively unaware  ? ?Social Determinants of Health  ? ?Financial Resource Strain: Not on file  ?Food Insecurity: Not on file  ?Transportation Needs: Not on file  ?Physical Activity: Not on file  ?Stress: Not on file  ?Social Connections: Not on file  ?Intimate Partner Violence: Not on file  ? ? ?Past Surgical History:  ?Procedure Laterality Date  ? CATARACT EXTRACTION W/ INTRAOCULAR LENS  IMPLANT, BILATERAL    ? CHOLECYSTECTOMY    ? COLONOSCOPY    ? COLONOSCOPY WITH PROPOFOL N/A 04/04/2018  ? Procedure: COLONOSCOPY WITH PROPOFOL;  Surgeon: Lollie Sails, MD;  Location: [facility] ENDOSCOPY;  Service: Endoscopy;  Laterality: N/A;  ? DIALYSIS/PERMA CATHETER INSERTION N/A 01/23/2022  ? Procedure: DIALYSIS/PERMA CATHETER INSERTION;  Surgeon: Algernon Huxley, MD;  Location: [place] CV LAB;  Service: Cardiovascular;  Laterality: N/A;  ? ESOPHAGOGASTRODUODENOSCOPY (EGD) WITH PROPOFOL N/A 04/04/2018  ? Procedure: ESOPHAGOGASTRODUODENOSCOPY (EGD) WITH PROPOFOL;  Surgeon: Lollie Sails, MD;  Location[facility] ENDOSCOPY;  Service: Endoscopy;  Laterality: N/A;  ? HERNIA REPAIR    ? NISSEN FUNDOPLICATION    ? TONSILLECTOMY    ? TUBAL LIGATION    ? UPPER GASTROINTESTINAL ENDOSCOPY    ? Uroplasty  ~2000's  ? Dr Bernardo Heater  ? ? ?Family History  ?Problem Relation Age of Onset  ? Cancer Mother   ?     lung cancer  ? Parkinson's disease Father   ? Dementia Father   ? COPD Father   ? Heart disease Father   ? Cancer Sister   ?     lung cancer  ?  Cancer Son   ?     Ewing's sarcoma  ? Breast cancer Neg Hx   ? ? ?Allergies  ?Allergen Reactions  ? Cefditoren Pivoxil   ?  Other reaction(s): Other (See Comments) ?GI issues, severe abdominal pain  ? Clarithromycin   ?  Other reaction(s): Other (See Comments) ?GI issues  ? Codeine Sulfate Other (See Comments)  ? Erythromycin Other (See Comments)  ? Other Other (See Comments)  ?  Oral iron  ? Pseudoephedrine   ?  ams  ? Sulfa Antibiotics Itching  ? ? ? ?  Latest Ref Rng & Units 02/13/2022  ?  9:13 AM 01/27/2022  ?  4:00 AM 01/26/2022  ?  6:51 AM  ?CBC  ?WBC 4.0 - 10.5 K/uL  11.3   11.6    ?Hemoglobin 12.0 - 15.0 g/dL 7.1   9.5   7.7    ?Hematocrit 36.0 - 46.0 %  29.0   24.4    ?Platelets 150 - 400 K/uL  261   272    ? ? ? ? ?CMP  ?   ?Component Value Date/Time  ? NA 137 01/27/2022 0400  ? K 4.1 01/27/2022 0400  ? CL 97 (L) 01/27/2022 0400  ? CO2 27 01/27/2022 0400  ? GLUCOSE 97 01/27/2022 0400  ? BUN 19 01/27/2022 0400  ? CREATININE 3.58 (H) 01/27/2022 0400  ? CALCIUM 9.0 01/27/2022 0400  ? PROT 7.0 01/21/2022 1036  ? ALBUMIN 3.3 (L) 01/27/2022 0400  ? AST 20 01/21/2022 1036  ? ALT 15 01/21/2022 1036  ? ALKPHOS 88 01/21/2022 1036  ? BILITOT 0.7 01/21/2022 1036  ? GFRNONAA 13 (L) 01/27/2022 0400  ? ? ? ?No results found. ? ?   ?Assessment & Plan:  ? ?1. ESRD (end stage renal disease) ([place]) ?Recommend: ? ?At this time the patient does not have appropriate extremity access for dialysis ? ?Patient should have a left brachial cephalic AV Fistula created, if the vein is found not suitable during surgery a brachial axillary graft to be inserted instead. ? ?The risks, benefits and alternative therapies were reviewed in detail with the patient.  All questions were answered.  The patient agrees to proceed with surgery.  ? ?The patient will follow up with me in the office after the surgery.  ? ?2. Primary hypertension ?Continue antihypertensive medications as already ordered, these medications have been reviewed and there are no  changes at this time.  ? ?3. Type 2 diabetes mellitus with neurological manifestations, controlled ([place]) ?Continue  hypoglycemic medications as already ordered, these medications have been reviewed and there are no changes at this time. ? ?Hgb A1C to be monitored as already arranged by primary service  ? ? ?Current Outpatient Medications on File Prior to Visit  ?Medication Sig Dispense Refill  ? ascorbic acid (VITAMIN C) 500 MG tablet Take 500 mg by mouth daily.    ? aspirin EC 81 MG tablet Take 81 mg by mouth daily.     ? buPROPion (WELLBUTRIN XL) 150 MG 24 hr tablet Take 300 mg by mouth daily.    ? busPIRone (BUSPAR) 15 MG tablet Take 15 mg by mouth 3 (three) times daily.    ? calcium acetate (PHOSLO) 667 MG tablet Take 1,334 mg by mouth 3 (three) times daily with meals.    ? cetirizine (ZYRTEC) 10 MG tablet Take 1 tablet (10 mg total) by mouth daily. 90 tablet 3  ? diphenoxylate-atropine (LOMOTIL) 2.5-0.025 MG tablet TAKE 1 TABLET BY MOUTH 4 TIMES DAILY AS NEEDED FOR DIARRHEA OR LOOSE STOOLS. 90 tablet 0  ? DULoxetine (CYMBALTA) 30 MG capsule Take 1 capsule (30 mg total) by mouth daily. 90 capsule 3  ? famotidine (PEPCID) 20 MG tablet Take 20 mg by mouth at bedtime.    ? gabapentin (NEURONTIN) 300 MG capsule TAKE 1 CAPSULE BY MOUTH 2 TIMES DAILY 180 capsule 0  ? glucose blood (FREESTYLE LITE) test strip USE AS DIRECTED TO CHECK BLOOD SUGAR ONCE DAILY 100 each 0  ? glucose blood (FREESTYLE LITE) test strip USE AS DIRECTED TO CHECK BLOOD SUGAR ONCE DAILY    ? midodrine (PROAMATINE) 10 MG tablet Take 10 mg by mouth 3 (three) times daily.    ? Multiple Vitamin (MULTIVITAMIN) tablet Take 1 tablet by mouth daily.    ? multivitamin (RENA-VIT) TABS tablet Take 1 tablet by mouth at bedtime.    ? sennosides-docusate sodium (SENOKOT-S) 8.6-50 MG tablet Take 1 tablet by mouth daily. Do not take this med within 2 hours of other meds    ? verapamil (CALAN) 40 MG tablet Take 20 mg by mouth daily.    ? Zoster Vaccine Adjuvanted  [facility]) injection     ? ferrous sulfate 325 (65 FE) MG tablet Take 325 mg by mouth 2 (two) times daily with a meal. (Patient not taking: Reported on 02/13/2022)    ? Nutritional Supplements (FEEDING SUPPLEMEN

## 2022-03-19 ENCOUNTER — Telehealth (INDEPENDENT_AMBULATORY_CARE_PROVIDER_SITE_OTHER): Payer: Self-pay

## 2022-03-19 NOTE — Telephone Encounter (Signed)
I attempted to contact the patient to schedule a left brachial cephalic fistula with Dr. Lucky Cowboy. A  message has been left for a return call.

## 2022-03-25 DIAGNOSIS — N186 End stage renal disease: Secondary | ICD-10-CM | POA: Diagnosis not present

## 2022-03-25 DIAGNOSIS — Z992 Dependence on renal dialysis: Secondary | ICD-10-CM | POA: Diagnosis not present

## 2022-03-26 DIAGNOSIS — I10 Essential (primary) hypertension: Secondary | ICD-10-CM | POA: Diagnosis not present

## 2022-03-26 DIAGNOSIS — F419 Anxiety disorder, unspecified: Secondary | ICD-10-CM | POA: Diagnosis not present

## 2022-03-26 DIAGNOSIS — E119 Type 2 diabetes mellitus without complications: Secondary | ICD-10-CM | POA: Diagnosis not present

## 2022-03-26 DIAGNOSIS — F32A Depression, unspecified: Secondary | ICD-10-CM | POA: Diagnosis not present

## 2022-03-26 DIAGNOSIS — N186 End stage renal disease: Secondary | ICD-10-CM | POA: Diagnosis not present

## 2022-03-26 DIAGNOSIS — Z992 Dependence on renal dialysis: Secondary | ICD-10-CM | POA: Diagnosis not present

## 2022-03-27 DIAGNOSIS — D631 Anemia in chronic kidney disease: Secondary | ICD-10-CM | POA: Diagnosis not present

## 2022-03-27 DIAGNOSIS — Z992 Dependence on renal dialysis: Secondary | ICD-10-CM | POA: Diagnosis not present

## 2022-03-27 DIAGNOSIS — N186 End stage renal disease: Secondary | ICD-10-CM | POA: Diagnosis not present

## 2022-03-27 DIAGNOSIS — D509 Iron deficiency anemia, unspecified: Secondary | ICD-10-CM | POA: Diagnosis not present

## 2022-03-31 DIAGNOSIS — E119 Type 2 diabetes mellitus without complications: Secondary | ICD-10-CM | POA: Insufficient documentation

## 2022-03-31 DIAGNOSIS — I120 Hypertensive chronic kidney disease with stage 5 chronic kidney disease or end stage renal disease: Secondary | ICD-10-CM | POA: Diagnosis not present

## 2022-03-31 DIAGNOSIS — N185 Chronic kidney disease, stage 5: Secondary | ICD-10-CM | POA: Diagnosis not present

## 2022-03-31 DIAGNOSIS — Z9884 Bariatric surgery status: Secondary | ICD-10-CM | POA: Diagnosis not present

## 2022-03-31 DIAGNOSIS — Z4902 Encounter for fitting and adjustment of peritoneal dialysis catheter: Secondary | ICD-10-CM | POA: Diagnosis not present

## 2022-03-31 DIAGNOSIS — E1122 Type 2 diabetes mellitus with diabetic chronic kidney disease: Secondary | ICD-10-CM | POA: Diagnosis not present

## 2022-04-01 ENCOUNTER — Telehealth (INDEPENDENT_AMBULATORY_CARE_PROVIDER_SITE_OTHER): Payer: Self-pay

## 2022-04-01 NOTE — Telephone Encounter (Signed)
I have called the patient a couple of times and was unable to leave a message due to her voicemail box being full. I contacted the son and he stated he was sending her a message to call me.

## 2022-04-09 DIAGNOSIS — E1122 Type 2 diabetes mellitus with diabetic chronic kidney disease: Secondary | ICD-10-CM | POA: Diagnosis not present

## 2022-04-09 DIAGNOSIS — N186 End stage renal disease: Secondary | ICD-10-CM | POA: Diagnosis not present

## 2022-04-09 DIAGNOSIS — Z9884 Bariatric surgery status: Secondary | ICD-10-CM | POA: Diagnosis not present

## 2022-04-09 DIAGNOSIS — Z992 Dependence on renal dialysis: Secondary | ICD-10-CM | POA: Diagnosis not present

## 2022-04-09 DIAGNOSIS — Z4902 Encounter for fitting and adjustment of peritoneal dialysis catheter: Secondary | ICD-10-CM | POA: Diagnosis not present

## 2022-04-09 DIAGNOSIS — N185 Chronic kidney disease, stage 5: Secondary | ICD-10-CM | POA: Diagnosis not present

## 2022-04-09 DIAGNOSIS — I120 Hypertensive chronic kidney disease with stage 5 chronic kidney disease or end stage renal disease: Secondary | ICD-10-CM | POA: Diagnosis not present

## 2022-04-09 HISTORY — PX: PERITONEAL CATHETER INSERTION: SHX2223

## 2022-04-14 DIAGNOSIS — I10 Essential (primary) hypertension: Secondary | ICD-10-CM | POA: Diagnosis not present

## 2022-04-14 DIAGNOSIS — Z992 Dependence on renal dialysis: Secondary | ICD-10-CM | POA: Diagnosis not present

## 2022-04-14 DIAGNOSIS — E119 Type 2 diabetes mellitus without complications: Secondary | ICD-10-CM | POA: Diagnosis not present

## 2022-04-14 DIAGNOSIS — N2581 Secondary hyperparathyroidism of renal origin: Secondary | ICD-10-CM | POA: Diagnosis not present

## 2022-04-14 DIAGNOSIS — N186 End stage renal disease: Secondary | ICD-10-CM | POA: Diagnosis not present

## 2022-04-14 DIAGNOSIS — K580 Irritable bowel syndrome with diarrhea: Secondary | ICD-10-CM | POA: Diagnosis not present

## 2022-04-14 DIAGNOSIS — Z0000 Encounter for general adult medical examination without abnormal findings: Secondary | ICD-10-CM | POA: Diagnosis not present

## 2022-04-22 ENCOUNTER — Other Ambulatory Visit: Payer: Self-pay | Admitting: Family Medicine

## 2022-04-22 DIAGNOSIS — R197 Diarrhea, unspecified: Secondary | ICD-10-CM | POA: Diagnosis not present

## 2022-04-22 DIAGNOSIS — R1012 Left upper quadrant pain: Secondary | ICD-10-CM | POA: Diagnosis not present

## 2022-04-22 DIAGNOSIS — N186 End stage renal disease: Secondary | ICD-10-CM | POA: Diagnosis not present

## 2022-04-22 DIAGNOSIS — Z992 Dependence on renal dialysis: Secondary | ICD-10-CM | POA: Diagnosis not present

## 2022-04-24 DIAGNOSIS — Z992 Dependence on renal dialysis: Secondary | ICD-10-CM | POA: Diagnosis not present

## 2022-04-24 DIAGNOSIS — N186 End stage renal disease: Secondary | ICD-10-CM | POA: Diagnosis not present

## 2022-04-27 DIAGNOSIS — N186 End stage renal disease: Secondary | ICD-10-CM | POA: Diagnosis not present

## 2022-04-27 DIAGNOSIS — D631 Anemia in chronic kidney disease: Secondary | ICD-10-CM | POA: Diagnosis not present

## 2022-04-27 DIAGNOSIS — D509 Iron deficiency anemia, unspecified: Secondary | ICD-10-CM | POA: Diagnosis not present

## 2022-04-27 DIAGNOSIS — Z992 Dependence on renal dialysis: Secondary | ICD-10-CM | POA: Diagnosis not present

## 2022-05-13 DIAGNOSIS — N186 End stage renal disease: Secondary | ICD-10-CM | POA: Diagnosis not present

## 2022-05-13 DIAGNOSIS — Z992 Dependence on renal dialysis: Secondary | ICD-10-CM | POA: Diagnosis not present

## 2022-05-25 DIAGNOSIS — Z992 Dependence on renal dialysis: Secondary | ICD-10-CM | POA: Diagnosis not present

## 2022-05-25 DIAGNOSIS — N186 End stage renal disease: Secondary | ICD-10-CM | POA: Diagnosis not present

## 2022-05-26 DIAGNOSIS — N186 End stage renal disease: Secondary | ICD-10-CM | POA: Diagnosis not present

## 2022-05-26 DIAGNOSIS — Z992 Dependence on renal dialysis: Secondary | ICD-10-CM | POA: Diagnosis not present

## 2022-05-28 DIAGNOSIS — N186 End stage renal disease: Secondary | ICD-10-CM | POA: Diagnosis not present

## 2022-05-28 DIAGNOSIS — Z992 Dependence on renal dialysis: Secondary | ICD-10-CM | POA: Diagnosis not present

## 2022-06-05 ENCOUNTER — Telehealth (INDEPENDENT_AMBULATORY_CARE_PROVIDER_SITE_OTHER): Payer: Self-pay

## 2022-06-05 NOTE — Telephone Encounter (Signed)
I attempted to contact the patient to schedule a permcath removal and a message was left for a return call. 

## 2022-06-15 ENCOUNTER — Encounter
Admission: RE | Disposition: A | Discharge status: Home / Self Care | Payer: Self-pay | Source: Home / Self Care | Attending: Vascular Surgery

## 2022-06-15 ENCOUNTER — Encounter: Payer: Self-pay | Admitting: Vascular Surgery

## 2022-06-15 ENCOUNTER — Ambulatory Visit
Admission: RE | Admit: 2022-06-15 | Discharge: 2022-06-15 | Disposition: A | Discharge status: Home / Self Care | Payer: PPO | Attending: Vascular Surgery | Admitting: Vascular Surgery

## 2022-06-15 DIAGNOSIS — Z992 Dependence on renal dialysis: Secondary | ICD-10-CM | POA: Diagnosis not present

## 2022-06-15 DIAGNOSIS — Z4901 Encounter for fitting and adjustment of extracorporeal dialysis catheter: Secondary | ICD-10-CM | POA: Diagnosis not present

## 2022-06-15 DIAGNOSIS — I120 Hypertensive chronic kidney disease with stage 5 chronic kidney disease or end stage renal disease: Secondary | ICD-10-CM | POA: Diagnosis not present

## 2022-06-15 DIAGNOSIS — E1122 Type 2 diabetes mellitus with diabetic chronic kidney disease: Secondary | ICD-10-CM | POA: Insufficient documentation

## 2022-06-15 DIAGNOSIS — Z95828 Presence of other vascular implants and grafts: Secondary | ICD-10-CM | POA: Diagnosis not present

## 2022-06-15 DIAGNOSIS — N186 End stage renal disease: Secondary | ICD-10-CM | POA: Diagnosis not present

## 2022-06-15 HISTORY — PX: DIALYSIS/PERMA CATHETER REMOVAL: CATH118289

## 2022-06-15 SURGERY — DIALYSIS/PERMA CATHETER REMOVAL
Anesthesia: LOCAL

## 2022-06-15 SURGICAL SUPPLY — 2 items
FORCEPS HALSTEAD CVD 5IN STRL (INSTRUMENTS) IMPLANT
TRAY LACERAT/PLASTIC (MISCELLANEOUS) IMPLANT

## 2022-06-15 NOTE — H&P (Signed)
[facility] Admission History & Physical  MRN : [id number]  Anita Greene is a 71 y.o. ([birth year]) female who presents with chief complaint of No chief complaint on file. Marland Kitchen  History of Present Illness: I am asked to evaluate the patient by the dialysis center. The patient was sent here because they have a nonfunctioning tunneled catheter and a functioning permanent access.  The patient reports they're not been any problems with any of their dialysis runs. They are reporting good flows with good parameters at dialysis.   Patient denies pain or tenderness overlying the access.  There is no pain with dialysis.  The patient denies hand pain or finger pain consistent with steal syndrome.  No fevers or chills while on dialysis.    No current facility-administered medications for this encounter.    Past Medical History:  Diagnosis Date   Allergic rhinitis due to pollen    Arthritis    B12 deficiency    Chronic kidney disease    Fibromyalgia    GERD (gastroesophageal reflux disease)    Hemiparesis affecting right side as late effect of cerebrovascular accident (CVA) ([facility])    Hypertension    IBS (irritable bowel syndrome)    diarrhea prone   Iron deficiency anemia    Migraine headache    Mood disorder ([facility])    Obstructive sleep apnea    CPAP 11 (auto titrate)   Optic atrophy of right eye    PFO (patent foramen ovale)    RLS (restless legs syndrome)    Sleep disturbance    Stroke ([place])    Type 2 diabetes mellitus with neurological manifestations, controlled ([place])     Past Surgical History:  Procedure Laterality Date   CATARACT EXTRACTION W/ INTRAOCULAR LENS  IMPLANT, BILATERAL     CHOLECYSTECTOMY     COLONOSCOPY     COLONOSCOPY WITH PROPOFOL N/A 04/04/2018   Procedure: COLONOSCOPY WITH PROPOFOL;  Surgeon: Lollie Sails, MD;  Location: [facility] ENDOSCOPY;  Service: Endoscopy;  Laterality: N/A;   DIALYSIS/PERMA CATHETER INSERTION N/A 01/23/2022    Procedure: DIALYSIS/PERMA CATHETER INSERTION;  Surgeon: Algernon Huxley, MD;  Location: [place] CV LAB;  Service: Cardiovascular;  Laterality: N/A;   ESOPHAGOGASTRODUODENOSCOPY (EGD) WITH PROPOFOL N/A 04/04/2018   Procedure: ESOPHAGOGASTRODUODENOSCOPY (EGD) WITH PROPOFOL;  Surgeon: Lollie Sails, MD;  Location: [facility] ENDOSCOPY;  Service: Endoscopy;  Laterality: N/A;   HERNIA REPAIR     NISSEN FUNDOPLICATION     TONSILLECTOMY     TUBAL LIGATION     UPPER GASTROINTESTINAL ENDOSCOPY     Uroplasty  ~2000's   Dr Bernardo Heater    Social History   Tobacco Use   Smoking status: Never   Smokeless tobacco: Never  Vaping Use   Vaping Use: Never used  Substance Use Topics   Alcohol use: Never    Alcohol/week: 0.0 standard drinks of alcohol     Family History  Problem Relation Age of Onset   Cancer Mother        lung cancer   Parkinson's disease Father    Dementia Father    COPD Father    Heart disease Father    Cancer Sister        lung cancer   Cancer Son        Ewing's sarcoma   Breast cancer Neg Hx     No family history of bleeding or clotting disorders, autoimmune disease or porphyria  Allergies  Allergen Reactions   Cefditoren Pivoxil  Other reaction(s): Other (See Comments) GI issues, severe abdominal pain   Clarithromycin     Other reaction(s): Other (See Comments) GI issues   Codeine Sulfate Other (See Comments)   Erythromycin Other (See Comments)   Other Other (See Comments)    Oral iron   Pseudoephedrine     ams   Sulfa Antibiotics Itching     REVIEW OF SYSTEMS (Negative unless checked)  Constitutional: '[]'$ Weight loss  '[]'$ Fever  '[]'$ Chills Cardiac: '[]'$ Chest pain   '[]'$ Chest pressure   '[]'$ Palpitations   '[]'$ Shortness of breath when laying flat   '[]'$ Shortness of breath at rest   '[x]'$ Shortness of breath with exertion. Vascular:  '[]'$ Pain in legs with walking   '[]'$ Pain in legs at rest   '[]'$ Pain in legs when laying flat   '[]'$ Claudication   '[]'$ Pain in feet when walking  '[]'$ Pain  in feet at rest  '[]'$ Pain in feet when laying flat   '[]'$ History of DVT   '[]'$ Phlebitis   '[]'$ Swelling in legs   '[]'$ Varicose veins   '[]'$ Non-healing ulcers Pulmonary:   '[]'$ Uses home oxygen   '[]'$ Productive cough   '[]'$ Hemoptysis   '[]'$ Wheeze  '[]'$ COPD   '[]'$ Asthma Neurologic:  '[]'$ Dizziness  '[]'$ Blackouts   '[]'$ Seizures   '[]'$ History of stroke   '[]'$ History of TIA  '[]'$ Aphasia   '[]'$ Temporary blindness   '[]'$ Dysphagia   '[]'$ Weakness or numbness in arms   '[]'$ Weakness or numbness in legs Musculoskeletal:  '[]'$ Arthritis   '[]'$ Joint swelling   '[]'$ Joint pain   '[]'$ Low back pain Hematologic:  '[]'$ Easy bruising  '[]'$ Easy bleeding   '[]'$ Hypercoagulable state   '[]'$ Anemic  '[]'$ Hepatitis Gastrointestinal:  '[]'$ Blood in stool   '[]'$ Vomiting blood  '[]'$ Gastroesophageal reflux/heartburn   '[]'$ Difficulty swallowing. Genitourinary:  '[x]'$ Chronic kidney disease   '[]'$ Difficult urination  '[]'$ Frequent urination  '[]'$ Burning with urination   '[]'$ Blood in urine Skin:  '[]'$ Rashes   '[]'$ Ulcers   '[]'$ Wounds Psychological:  '[]'$ History of anxiety   '[]'$  History of major depression.  Physical Examination  Vitals:   06/15/22 1419  BP: (!) 156/80  Pulse: 88  Resp: 18  Temp: 98.4 F (36.9 C)  TempSrc: Oral  SpO2: 98%   There is no height or weight on file to calculate BMI. Gen: WD/WN, NAD Head: [place]/AT, No temporalis wasting.  Ear/Nose/Throat: Hearing grossly intact, nares w/o erythema or drainage, oropharynx w/o Erythema/Exudate,  Eyes: Conjunctiva clear, sclera non-icteric Neck: Trachea midline.  No JVD.  Pulmonary:  Good air movement, respirations not labored, no use of accessory muscles.  Cardiac: RRR, normal S1, S2. Vascular: permcath present right chest Vessel Right Left  Radial Palpable Palpable   Musculoskeletal: M/S 5/5 throughout.  Extremities without ischemic changes.  No deformity or atrophy.  Neurologic: Sensation grossly intact in extremities.  Symmetrical.  Speech is fluent. Motor exam as listed above. Psychiatric: Judgment intact, Mood & affect appropriate for pt's clinical  situation. Dermatologic: No rashes or ulcers noted.  No cellulitis or open wounds.    CBC Lab Results  Component Value Date   WBC 11.3 (H) 01/27/2022   HGB 7.1 (L) 02/13/2022   HCT 29.0 (L) 01/27/2022   MCV 91.8 01/27/2022   PLT 261 01/27/2022    BMET    Component Value Date/Time   NA 137 01/27/2022 0400   K 4.1 01/27/2022 0400   CL 97 (L) 01/27/2022 0400   CO2 27 01/27/2022 0400   GLUCOSE 97 01/27/2022 0400   BUN 19 01/27/2022 0400   CREATININE 3.58 (H) 01/27/2022 0400   CALCIUM 9.0 01/27/2022 0400   GFRNONAA 13 (L)  01/27/2022 0400   CrCl cannot be calculated (Patient's most recent lab result is older than the maximum 21 days allowed.).  COAG Lab Results  Component Value Date   INR 1.0 08/11/2021    Radiology No results found.  Assessment/Plan 1.  Complication dialysis device:  Patient's Tunneled catheter is not being used. The patient has an extremity access that is functioning well. Therefore, the patient will undergo removal of the tunneled catheter under local anesthesia.  The risks and benefits were described to the patient.  All questions were answered.  The patient agrees to proceed with angiography and intervention. Potassium will be drawn to ensure that it is an appropriate level prior to performing intervention. 2.  End-stage renal disease requiring hemodialysis:  Patient will continue dialysis therapy without further interruption  3.  Hypertension:  Patient will continue medical management; nephrology is following no changes in oral medications.     Leotis Pain, MD  06/15/2022 2:29 PM

## 2022-06-15 NOTE — Discharge Instructions (Signed)
Tunneled Catheter Removal, Care After Refer to this sheet in the next few weeks. These instructions provide you with information about caring for yourself after your procedure. Your health care provider may also give you more specific instructions. Your treatment has been planned according to current medical practices, but problems sometimes occur. Call your health care provider if you have any problems or questions after your procedure. What can I expect after the procedure? After the procedure, it is common to have: Some mild redness, swelling, and pain around your catheter site.   Follow these instructions at home: Incision care  Check your removal site  every day for signs of infection. Check for: More redness, swelling, or pain. More fluid or blood. Warmth. Pus or a bad smell. Remove your dressing in 48hrs leave open to air  Activity  Return to your normal activities as told by your health care provider. Ask your health care provider what activities are safe for you. Do not lift anything that is heavier than 10 lb (4.5 kg) for 3 days  You may shower tomorrow  Contact a health care provider if: You have more fluid or blood coming from your removal site You have more redness, swelling, or pain at your incisions or around the area where your catheter was removed Your removal site feel warm to the touch. You feel unusually weak. You feel nauseous.. Get help right away if You have swelling in your arm, shoulder, neck, or face. You develop chest pain. You have difficulty breathing. You feel dizzy or light-headed. You have pus or a bad smell coming from your removal site You have a fever. You develop bleeding from your removal site, and your bleeding does not stop. This information is not intended to replace advice given to you by your health care provider. Make sure you discuss any questions you have with your health care provider. Document Released: 09/28/2012 Document Revised:  06/14/2016 Document Reviewed: 07/08/2015 Elsevier Interactive Patient Education  2017 [employer] 

## 2022-06-15 NOTE — Op Note (Signed)
Operative Note     Preoperative diagnosis:   1. ESRD with functional permanent access  Postoperative diagnosis:  1. ESRD with functional permanent access  Procedure:  Removal of right jugular Permcath  Surgeon:  Leotis Pain, MD  Assistant: Eulogio Ditch, NP  Anesthesia:  Local  EBL:  Minimal  Indication for the Procedure:  The patient has a functional permanent dialysis access and no longer needs their permcath.  This can be removed.  Risks and benefits are discussed and informed consent is obtained.  Description of the Procedure:  The patient's right neck, chest and existing catheter were sterilely prepped and draped. The area around the catheter was anesthetized copiously with 1% lidocaine. The catheter was dissected out with curved hemostats until the cuff was freed from the surrounding fibrous sheath. The fiber sheath was transected, and the catheter was then removed in its entirety using gentle traction. Pressure was held and sterile dressings were placed. The patient tolerated the procedure well and was taken to the recovery room in stable condition.     Leotis Pain  06/15/2022, 2:55 PM This note was created with Dragon Medical transcription system. Any errors in dictation are purely unintentional.

## 2022-06-25 DIAGNOSIS — N186 End stage renal disease: Secondary | ICD-10-CM | POA: Diagnosis not present

## 2022-06-25 DIAGNOSIS — Z992 Dependence on renal dialysis: Secondary | ICD-10-CM | POA: Diagnosis not present

## 2022-06-26 DIAGNOSIS — Z992 Dependence on renal dialysis: Secondary | ICD-10-CM | POA: Diagnosis not present

## 2022-06-26 DIAGNOSIS — Z23 Encounter for immunization: Secondary | ICD-10-CM | POA: Diagnosis not present

## 2022-06-26 DIAGNOSIS — N186 End stage renal disease: Secondary | ICD-10-CM | POA: Diagnosis not present

## 2022-06-26 DIAGNOSIS — D509 Iron deficiency anemia, unspecified: Secondary | ICD-10-CM | POA: Diagnosis not present

## 2022-07-25 DIAGNOSIS — Z992 Dependence on renal dialysis: Secondary | ICD-10-CM | POA: Diagnosis not present

## 2022-07-25 DIAGNOSIS — N186 End stage renal disease: Secondary | ICD-10-CM | POA: Diagnosis not present

## 2022-07-26 DIAGNOSIS — Z992 Dependence on renal dialysis: Secondary | ICD-10-CM | POA: Diagnosis not present

## 2022-07-26 DIAGNOSIS — N186 End stage renal disease: Secondary | ICD-10-CM | POA: Diagnosis not present

## 2022-07-26 DIAGNOSIS — D509 Iron deficiency anemia, unspecified: Secondary | ICD-10-CM | POA: Diagnosis not present

## 2022-08-12 DIAGNOSIS — H353132 Nonexudative age-related macular degeneration, bilateral, intermediate dry stage: Secondary | ICD-10-CM | POA: Diagnosis not present

## 2022-08-12 DIAGNOSIS — H53461 Homonymous bilateral field defects, right side: Secondary | ICD-10-CM | POA: Diagnosis not present

## 2022-08-24 ENCOUNTER — Encounter (INDEPENDENT_AMBULATORY_CARE_PROVIDER_SITE_OTHER): Payer: Self-pay

## 2022-08-25 DIAGNOSIS — N186 End stage renal disease: Secondary | ICD-10-CM | POA: Diagnosis not present

## 2022-08-25 DIAGNOSIS — Z992 Dependence on renal dialysis: Secondary | ICD-10-CM | POA: Diagnosis not present

## 2022-08-26 DIAGNOSIS — N186 End stage renal disease: Secondary | ICD-10-CM | POA: Diagnosis not present

## 2022-08-26 DIAGNOSIS — D509 Iron deficiency anemia, unspecified: Secondary | ICD-10-CM | POA: Diagnosis not present

## 2022-08-26 DIAGNOSIS — Z992 Dependence on renal dialysis: Secondary | ICD-10-CM | POA: Diagnosis not present

## 2022-08-26 DIAGNOSIS — Z23 Encounter for immunization: Secondary | ICD-10-CM | POA: Diagnosis not present

## 2022-09-24 DIAGNOSIS — N186 End stage renal disease: Secondary | ICD-10-CM | POA: Diagnosis not present

## 2022-09-24 DIAGNOSIS — Z992 Dependence on renal dialysis: Secondary | ICD-10-CM | POA: Diagnosis not present

## 2022-09-25 DIAGNOSIS — D631 Anemia in chronic kidney disease: Secondary | ICD-10-CM | POA: Diagnosis not present

## 2022-09-25 DIAGNOSIS — D509 Iron deficiency anemia, unspecified: Secondary | ICD-10-CM | POA: Diagnosis not present

## 2022-09-25 DIAGNOSIS — N186 End stage renal disease: Secondary | ICD-10-CM | POA: Diagnosis not present

## 2022-09-25 DIAGNOSIS — Z992 Dependence on renal dialysis: Secondary | ICD-10-CM | POA: Diagnosis not present

## 2022-10-25 DIAGNOSIS — Z992 Dependence on renal dialysis: Secondary | ICD-10-CM | POA: Diagnosis not present

## 2022-10-25 DIAGNOSIS — N186 End stage renal disease: Secondary | ICD-10-CM | POA: Diagnosis not present

## 2022-10-26 DIAGNOSIS — Z23 Encounter for immunization: Secondary | ICD-10-CM | POA: Diagnosis not present

## 2022-10-26 DIAGNOSIS — D509 Iron deficiency anemia, unspecified: Secondary | ICD-10-CM | POA: Diagnosis not present

## 2022-10-26 DIAGNOSIS — D631 Anemia in chronic kidney disease: Secondary | ICD-10-CM | POA: Diagnosis not present

## 2022-10-26 DIAGNOSIS — N186 End stage renal disease: Secondary | ICD-10-CM | POA: Diagnosis not present

## 2022-10-26 DIAGNOSIS — Z992 Dependence on renal dialysis: Secondary | ICD-10-CM | POA: Diagnosis not present

## 2022-11-25 DIAGNOSIS — Z992 Dependence on renal dialysis: Secondary | ICD-10-CM | POA: Diagnosis not present

## 2022-11-25 DIAGNOSIS — N186 End stage renal disease: Secondary | ICD-10-CM | POA: Diagnosis not present

## 2022-11-26 DIAGNOSIS — Z992 Dependence on renal dialysis: Secondary | ICD-10-CM | POA: Diagnosis not present

## 2022-11-26 DIAGNOSIS — N186 End stage renal disease: Secondary | ICD-10-CM | POA: Diagnosis not present

## 2022-11-26 DIAGNOSIS — D509 Iron deficiency anemia, unspecified: Secondary | ICD-10-CM | POA: Diagnosis not present

## 2022-11-26 DIAGNOSIS — D631 Anemia in chronic kidney disease: Secondary | ICD-10-CM | POA: Diagnosis not present

## 2022-12-23 ENCOUNTER — Ambulatory Visit
Admission: RE | Admit: 2022-12-23 | Discharge: 2022-12-23 | Disposition: A | Discharge status: Home / Self Care | Payer: PPO | Attending: Nephrology | Admitting: Nephrology

## 2022-12-23 ENCOUNTER — Other Ambulatory Visit: Payer: Self-pay | Admitting: Nephrology

## 2022-12-23 ENCOUNTER — Ambulatory Visit
Admission: RE | Admit: 2022-12-23 | Discharge: 2022-12-23 | Disposition: A | Discharge status: Home / Self Care | Payer: PPO | Source: Ambulatory Visit | Attending: Nephrology | Admitting: Nephrology

## 2022-12-23 DIAGNOSIS — N186 End stage renal disease: Secondary | ICD-10-CM | POA: Diagnosis not present

## 2022-12-23 DIAGNOSIS — T85611A Breakdown (mechanical) of intraperitoneal dialysis catheter, initial encounter: Secondary | ICD-10-CM | POA: Diagnosis not present

## 2022-12-23 DIAGNOSIS — R109 Unspecified abdominal pain: Secondary | ICD-10-CM | POA: Diagnosis not present

## 2022-12-23 NOTE — Progress Notes (Signed)
Pain with drain Pain reported with saline push Resistance with aspiration reported by nursing No c/o constipation Plan Obtain X Ray

## 2022-12-24 DIAGNOSIS — N186 End stage renal disease: Secondary | ICD-10-CM | POA: Diagnosis not present

## 2022-12-24 DIAGNOSIS — Z992 Dependence on renal dialysis: Secondary | ICD-10-CM | POA: Diagnosis not present

## 2022-12-28 DIAGNOSIS — E875 Hyperkalemia: Secondary | ICD-10-CM | POA: Diagnosis not present

## 2022-12-28 DIAGNOSIS — N185 Chronic kidney disease, stage 5: Secondary | ICD-10-CM | POA: Diagnosis not present

## 2022-12-28 DIAGNOSIS — R600 Localized edema: Secondary | ICD-10-CM | POA: Diagnosis not present

## 2022-12-28 DIAGNOSIS — I950 Idiopathic hypotension: Secondary | ICD-10-CM | POA: Diagnosis not present

## 2022-12-28 DIAGNOSIS — I10 Essential (primary) hypertension: Secondary | ICD-10-CM | POA: Diagnosis not present

## 2022-12-28 DIAGNOSIS — N179 Acute kidney failure, unspecified: Secondary | ICD-10-CM | POA: Diagnosis not present

## 2022-12-28 DIAGNOSIS — E1122 Type 2 diabetes mellitus with diabetic chronic kidney disease: Secondary | ICD-10-CM | POA: Diagnosis not present

## 2022-12-28 DIAGNOSIS — D631 Anemia in chronic kidney disease: Secondary | ICD-10-CM | POA: Diagnosis not present

## 2022-12-28 DIAGNOSIS — N184 Chronic kidney disease, stage 4 (severe): Secondary | ICD-10-CM | POA: Diagnosis not present

## 2022-12-30 DIAGNOSIS — D631 Anemia in chronic kidney disease: Secondary | ICD-10-CM | POA: Diagnosis not present

## 2022-12-30 DIAGNOSIS — D509 Iron deficiency anemia, unspecified: Secondary | ICD-10-CM | POA: Diagnosis not present

## 2022-12-30 DIAGNOSIS — T85898A Other specified complication of other internal prosthetic devices, implants and grafts, initial encounter: Secondary | ICD-10-CM | POA: Diagnosis not present

## 2022-12-30 DIAGNOSIS — Z992 Dependence on renal dialysis: Secondary | ICD-10-CM | POA: Diagnosis not present

## 2022-12-30 DIAGNOSIS — N186 End stage renal disease: Secondary | ICD-10-CM | POA: Diagnosis not present

## 2023-01-08 ENCOUNTER — Ambulatory Visit: Payer: PPO | Admitting: Surgery

## 2023-01-08 DIAGNOSIS — I120 Hypertensive chronic kidney disease with stage 5 chronic kidney disease or end stage renal disease: Secondary | ICD-10-CM | POA: Diagnosis not present

## 2023-01-08 DIAGNOSIS — Z1330 Encounter for screening examination for mental health and behavioral disorders, unspecified: Secondary | ICD-10-CM | POA: Diagnosis not present

## 2023-01-08 DIAGNOSIS — I10 Essential (primary) hypertension: Secondary | ICD-10-CM | POA: Diagnosis not present

## 2023-01-08 DIAGNOSIS — N186 End stage renal disease: Secondary | ICD-10-CM | POA: Diagnosis not present

## 2023-01-12 ENCOUNTER — Other Ambulatory Visit
Admission: RE | Admit: 2023-01-12 | Discharge: 2023-01-12 | Disposition: A | Discharge status: Home / Self Care | Payer: PPO | Attending: Nephrology | Admitting: Nephrology

## 2023-01-12 DIAGNOSIS — N186 End stage renal disease: Secondary | ICD-10-CM | POA: Insufficient documentation

## 2023-01-12 DIAGNOSIS — Z992 Dependence on renal dialysis: Secondary | ICD-10-CM | POA: Insufficient documentation

## 2023-01-12 LAB — RENAL FUNCTION PANEL
Albumin: 3.4 g/dL — ABNORMAL LOW (ref 3.5–5.0)
Anion gap: 9 (ref 5–15)
BUN: 46 mg/dL — ABNORMAL HIGH (ref 8–23)
CO2: 18 mmol/L — ABNORMAL LOW (ref 22–32)
Calcium: 9.6 mg/dL (ref 8.9–10.3)
Chloride: 109 mmol/L (ref 98–111)
Creatinine, Ser: 3.98 mg/dL — ABNORMAL HIGH (ref 0.44–1.00)
GFR, Estimated: 11 mL/min — ABNORMAL LOW (ref >60)
Glucose, Bld: 133 mg/dL — ABNORMAL HIGH (ref 70–99)
Phosphorus: 5.6 mg/dL — ABNORMAL HIGH (ref 2.5–4.6)
Potassium: 5.8 mmol/L — ABNORMAL HIGH (ref 3.5–5.1)
Sodium: 136 mmol/L (ref 135–145)

## 2023-01-12 LAB — MAGNESIUM: Magnesium: 2.1 mg/dL (ref 1.7–2.4)

## 2023-01-12 LAB — CBC
HCT: 39.4 % (ref 36.0–46.0)
Hemoglobin: 12 g/dL (ref 12.0–15.0)
MCH: 30.1 pg (ref 26.0–34.0)
MCHC: 30.5 g/dL (ref 30.0–36.0)
MCV: 98.7 fL (ref 80.0–100.0)
Platelets: 392 10*3/uL (ref 150–400)
RBC: 3.99 MIL/uL (ref 3.87–5.11)
RDW: 14.4 % (ref 11.5–15.5)
WBC: 10.1 10*3/uL (ref 4.0–10.5)
nRBC: 0 % (ref 0.0–0.2)

## 2023-01-13 DIAGNOSIS — F419 Anxiety disorder, unspecified: Secondary | ICD-10-CM | POA: Diagnosis not present

## 2023-01-13 DIAGNOSIS — F32A Depression, unspecified: Secondary | ICD-10-CM | POA: Diagnosis not present

## 2023-01-13 DIAGNOSIS — E785 Hyperlipidemia, unspecified: Secondary | ICD-10-CM | POA: Diagnosis not present

## 2023-01-13 DIAGNOSIS — Z9884 Bariatric surgery status: Secondary | ICD-10-CM | POA: Diagnosis not present

## 2023-01-13 DIAGNOSIS — Z7982 Long term (current) use of aspirin: Secondary | ICD-10-CM | POA: Diagnosis not present

## 2023-01-13 DIAGNOSIS — N186 End stage renal disease: Secondary | ICD-10-CM | POA: Diagnosis not present

## 2023-01-13 DIAGNOSIS — G4730 Sleep apnea, unspecified: Secondary | ICD-10-CM | POA: Diagnosis not present

## 2023-01-13 DIAGNOSIS — K660 Peritoneal adhesions (postprocedural) (postinfection): Secondary | ICD-10-CM | POA: Diagnosis not present

## 2023-01-13 DIAGNOSIS — I120 Hypertensive chronic kidney disease with stage 5 chronic kidney disease or end stage renal disease: Secondary | ICD-10-CM | POA: Diagnosis not present

## 2023-01-13 DIAGNOSIS — Z8673 Personal history of transient ischemic attack (TIA), and cerebral infarction without residual deficits: Secondary | ICD-10-CM | POA: Diagnosis not present

## 2023-01-13 DIAGNOSIS — T85691A Other mechanical complication of intraperitoneal dialysis catheter, initial encounter: Secondary | ICD-10-CM | POA: Diagnosis not present

## 2023-01-13 DIAGNOSIS — E1122 Type 2 diabetes mellitus with diabetic chronic kidney disease: Secondary | ICD-10-CM | POA: Diagnosis not present

## 2023-01-14 DIAGNOSIS — Z992 Dependence on renal dialysis: Secondary | ICD-10-CM | POA: Diagnosis not present

## 2023-01-14 DIAGNOSIS — N186 End stage renal disease: Secondary | ICD-10-CM | POA: Diagnosis not present

## 2023-01-21 ENCOUNTER — Telehealth (INDEPENDENT_AMBULATORY_CARE_PROVIDER_SITE_OTHER): Payer: Self-pay

## 2023-01-21 NOTE — Telephone Encounter (Signed)
Spoke with the patient and she is scheduled with Dr. Lucky Cowboy on 01/28/23 with a 8:00 am arrival time to the [facility] for a permcath insertion. Pre-procedure instructions were discussed and will be sent to [place].

## 2023-01-24 DIAGNOSIS — Z992 Dependence on renal dialysis: Secondary | ICD-10-CM | POA: Diagnosis not present

## 2023-01-24 DIAGNOSIS — N186 End stage renal disease: Secondary | ICD-10-CM | POA: Diagnosis not present

## 2023-01-28 ENCOUNTER — Ambulatory Visit
Admission: RE | Admit: 2023-01-28 | Discharge: 2023-01-28 | Disposition: A | Discharge status: Home / Self Care | Payer: PPO | Attending: Vascular Surgery | Admitting: Vascular Surgery

## 2023-01-28 ENCOUNTER — Encounter: Payer: Self-pay | Admitting: Vascular Surgery

## 2023-01-28 ENCOUNTER — Encounter
Admission: RE | Disposition: A | Discharge status: Home / Self Care | Payer: Self-pay | Source: Home / Self Care | Attending: Vascular Surgery

## 2023-01-28 ENCOUNTER — Other Ambulatory Visit: Payer: Self-pay

## 2023-01-28 DIAGNOSIS — Z992 Dependence on renal dialysis: Secondary | ICD-10-CM

## 2023-01-28 DIAGNOSIS — N186 End stage renal disease: Secondary | ICD-10-CM | POA: Insufficient documentation

## 2023-01-28 DIAGNOSIS — Z95828 Presence of other vascular implants and grafts: Secondary | ICD-10-CM

## 2023-01-28 DIAGNOSIS — I120 Hypertensive chronic kidney disease with stage 5 chronic kidney disease or end stage renal disease: Secondary | ICD-10-CM | POA: Diagnosis not present

## 2023-01-28 DIAGNOSIS — Z9889 Other specified postprocedural states: Secondary | ICD-10-CM

## 2023-01-28 DIAGNOSIS — E1122 Type 2 diabetes mellitus with diabetic chronic kidney disease: Secondary | ICD-10-CM | POA: Diagnosis not present

## 2023-01-28 HISTORY — PX: DIALYSIS/PERMA CATHETER INSERTION: CATH118288

## 2023-01-28 SURGERY — DIALYSIS/PERMA CATHETER INSERTION
Anesthesia: Moderate Sedation

## 2023-01-28 MED ORDER — MIDAZOLAM HCL 2 MG/ML PO SYRP: 8.0000 mg | ORAL_SOLUTION | Freq: Once | ORAL | Status: DC | PRN

## 2023-01-28 MED ORDER — MIDAZOLAM HCL 2 MG/2ML IJ SOLN
INTRAMUSCULAR | Status: AC
  Filled 2023-01-28: qty 4

## 2023-01-28 MED ORDER — FENTANYL CITRATE (PF) 100 MCG/2ML IJ SOLN
INTRAMUSCULAR | Status: DC | PRN
  Administered 2023-01-28: 50 ug via INTRAVENOUS

## 2023-01-28 MED ORDER — MIDAZOLAM HCL 2 MG/2ML IJ SOLN
INTRAMUSCULAR | Status: DC | PRN
  Administered 2023-01-28: 1 mg via INTRAVENOUS

## 2023-01-28 MED ORDER — METHYLPREDNISOLONE SODIUM SUCC 125 MG IJ SOLR: 125.0000 mg | Freq: Once | INTRAMUSCULAR | Status: DC | PRN

## 2023-01-28 MED ORDER — VANCOMYCIN HCL IN DEXTROSE 1-5 GM/200ML-% IV SOLN
INTRAVENOUS | Status: AC
  Filled 2023-01-28: qty 200

## 2023-01-28 MED ORDER — HYDROMORPHONE HCL 1 MG/ML IJ SOLN: 1.0000 mg | Freq: Once | INTRAMUSCULAR | Status: DC | PRN

## 2023-01-28 MED ORDER — DIPHENHYDRAMINE HCL 50 MG/ML IJ SOLN: 50.0000 mg | Freq: Once | INTRAMUSCULAR | Status: DC | PRN

## 2023-01-28 MED ORDER — SODIUM CHLORIDE 0.9 % IV SOLN: INTRAVENOUS | Status: DC

## 2023-01-28 MED ORDER — ONDANSETRON HCL 4 MG/2ML IJ SOLN: 4.0000 mg | Freq: Four times a day (QID) | INTRAMUSCULAR | Status: DC | PRN

## 2023-01-28 MED ORDER — FAMOTIDINE 20 MG PO TABS: 40.0000 mg | ORAL_TABLET | Freq: Once | ORAL | Status: DC | PRN

## 2023-01-28 MED ORDER — FENTANYL CITRATE PF 50 MCG/ML IJ SOSY
PREFILLED_SYRINGE | INTRAMUSCULAR | Status: AC
  Filled 2023-01-28: qty 2

## 2023-01-28 MED ORDER — VANCOMYCIN HCL IN DEXTROSE 1-5 GM/200ML-% IV SOLN
1000.0000 mg | INTRAVENOUS | Status: AC
  Administered 2023-01-28: 1000 mg via INTRAVENOUS

## 2023-01-28 SURGICAL SUPPLY — 8 items
ADH SKN CLS APL DERMABOND .7 (GAUZE/BANDAGES/DRESSINGS) ×1
CANNULA 5F STIFF (CANNULA) IMPLANT
CATH PALIN MAXID VT KIT 19CM (CATHETERS) IMPLANT
COVER PROBE ULTRASOUND 5X96 (MISCELLANEOUS) IMPLANT
DERMABOND ADVANCED .7 DNX12 (GAUZE/BANDAGES/DRESSINGS) IMPLANT
PACK ANGIOGRAPHY (CUSTOM PROCEDURE TRAY) IMPLANT
SUT MNCRL AB 4-0 PS2 18 (SUTURE) IMPLANT
SUT PROLENE 0 CT 1 30 (SUTURE) IMPLANT

## 2023-01-28 NOTE — H&P (Signed)
[facility] Admission History & Physical  MRN : [id number]  Anita Greene is a 72 y.o. ([birth year]) female who presents with chief complaint of No chief complaint on file. Marland Kitchen  History of Present Illness: You are asked to place a PermCath on the patient for end-stage renal disease for hemodialysis access.  She had a PermCath about a year ago and this was taken out about 6 months ago.  She had a functional permanent access.  She had surgery for her peritoneal dialysis catheter about 2 weeks ago but now needs access for hemodialysis and we are asked to place a catheter.  No fevers or chills.  Current Facility-Administered Medications  Medication Dose Route Frequency Provider Last Rate Last Admin   0.9 %  sodium chloride infusion   Intravenous Continuous Kris Hartmann, NP       diphenhydrAMINE (BENADRYL) injection 50 mg  50 mg Intravenous Once PRN Kris Hartmann, NP       famotidine (PEPCID) tablet 40 mg  40 mg Oral Once PRN Kris Hartmann, NP       methylPREDNISolone sodium succinate (SOLU-MEDROL) 125 mg/2 mL injection 125 mg  125 mg Intravenous Once PRN Kris Hartmann, NP       midazolam (VERSED) 2 MG/ML syrup 8 mg  8 mg Oral Once PRN Kris Hartmann, NP       vancomycin (VANCOCIN) IVPB 1000 mg/200 mL premix  1,000 mg Intravenous 60 min Pre-Op Kris Hartmann, NP        Past Medical History:  Diagnosis Date   Allergic rhinitis due to pollen    Arthritis    B12 deficiency    Chronic kidney disease    Fibromyalgia    GERD (gastroesophageal reflux disease)    Hemiparesis affecting right side as late effect of cerebrovascular accident (CVA) (HCC)    Hypertension    IBS (irritable bowel syndrome)    diarrhea prone   Iron deficiency anemia    Migraine headache    Mood disorder (HCC)    Obstructive sleep apnea    CPAP 11 (auto titrate)   Optic atrophy of right eye    PFO (patent foramen ovale)    RLS (restless legs syndrome)    Sleep disturbance     Stroke (HCC)    Type 2 diabetes mellitus with neurological manifestations, controlled ([place])     Past Surgical History:  Procedure Laterality Date   CATARACT EXTRACTION W/ INTRAOCULAR LENS  IMPLANT, BILATERAL     CHOLECYSTECTOMY     COLONOSCOPY     COLONOSCOPY WITH PROPOFOL N/A 04/04/2018   Procedure: COLONOSCOPY WITH PROPOFOL;  Surgeon: Lollie Sails, MD;  Location: [facility] ENDOSCOPY;  Service: Endoscopy;  Laterality: N/A;   DIALYSIS/PERMA CATHETER INSERTION N/A 01/23/2022   Procedure: DIALYSIS/PERMA CATHETER INSERTION;  Surgeon: Algernon Huxley, MD;  Location: [place] [facility];  Service: Cardiovascular;  Laterality: N/A;   DIALYSIS/PERMA CATHETER REMOVAL N/A 06/15/2022   Procedure: DIALYSIS/PERMA CATHETER REMOVAL;  Surgeon: Algernon Huxley, MD;  Location: [place] [facility];  Service: Cardiovascular;  Laterality: N/A;   ESOPHAGOGASTRODUODENOSCOPY (EGD) WITH PROPOFOL N/A 04/04/2018   Procedure: ESOPHAGOGASTRODUODENOSCOPY (EGD) WITH PROPOFOL;  Surgeon: Lollie Sails, MD;  Location: [facility] ENDOSCOPY;  Service: Endoscopy;  Laterality: N/A;   HERNIA REPAIR     NISSEN FUNDOPLICATION     TONSILLECTOMY     TUBAL LIGATION     UPPER GASTROINTESTINAL ENDOSCOPY     Uroplasty  ~2000's  Dr Bernardo Heater     Social History   Tobacco Use   Smoking status: Never   Smokeless tobacco: Never  Vaping Use   Vaping Use: Never used  Substance Use Topics   Alcohol use: Never    Alcohol/week: 0.0 standard drinks of alcohol     Family History  Problem Relation Age of Onset   Cancer Mother        lung cancer   Parkinson's disease Father    Dementia Father    COPD Father    Heart disease Father    Cancer Sister        lung cancer   Cancer Son        Ewing's sarcoma   Breast cancer Neg Hx     Allergies  Allergen Reactions   Cefditoren Pivoxil     Other reaction(s): Other (See Comments) GI issues, severe abdominal pain   Clarithromycin     Other reaction(s): Other (See Comments) GI issues    Codeine Sulfate Other (See Comments)   Erythromycin Other (See Comments)   Other Other (See Comments)    Oral iron   Pseudoephedrine     ams   Sulfa Antibiotics Itching     REVIEW OF SYSTEMS (Negative unless checked)  Constitutional: [] Weight loss  [] Fever  [] Chills Cardiac: [] Chest pain   [] Chest pressure   [] Palpitations   [] Shortness of breath when laying flat   [] Shortness of breath at rest   [x] Shortness of breath with exertion. Vascular:  [] Pain in legs with walking   [] Pain in legs at rest   [] Pain in legs when laying flat   [] Claudication   [] Pain in feet when walking  [] Pain in feet at rest  [] Pain in feet when laying flat   [] History of DVT   [] Phlebitis   [] Swelling in legs   [] Varicose veins   [] Non-healing ulcers Pulmonary:   [] Uses home oxygen   [] Productive cough   [] Hemoptysis   [] Wheeze  [] COPD   [] Asthma Neurologic:  [x] Dizziness  [] Blackouts   [] Seizures   [x] History of stroke   [] History of TIA  [] Aphasia   [] Temporary blindness   [] Dysphagia   [] Weakness or numbness in arms   [] Weakness or numbness in legs Musculoskeletal:  [] Arthritis   [] Joint swelling   [x] Joint pain   [] Low back pain Hematologic:  [] Easy bruising  [] Easy bleeding   [] Hypercoagulable state   [x] Anemic  [] Hepatitis Gastrointestinal:  [] Blood in stool   [] Vomiting blood  [x] Gastroesophageal reflux/heartburn   [] Difficulty swallowing. Genitourinary:  [x] Chronic kidney disease   [] Difficult urination  [] Frequent urination  [] Burning with urination   [] Blood in urine Skin:  [] Rashes   [] Ulcers   [] Wounds Psychological:  [] History of anxiety   []  History of major depression.  Physical Examination  There were no vitals filed for this visit. There is no height or weight on file to calculate BMI. Gen: WD/WN, NAD Head: /AT, No temporalis wasting.  Ear/Nose/Throat: Hearing grossly intact, nares w/o erythema or drainage, oropharynx w/o Erythema/Exudate,  Eyes: Conjunctiva clear, sclera  non-icteric Neck: Trachea midline.  No JVD.  Pulmonary:  Good air movement, respirations not labored, no use of accessory muscles.  Cardiac: RRR, normal S1, S2. Vascular:  Vessel Right Left  Radial Palpable Palpable                          PT Palpable Palpable  DP Palpable Palpable   Gastrointestinal: soft, non-tender/non-distended. No guarding/reflex.  PD catheter in place Musculoskeletal: M/S 5/5 throughout.  Extremities without ischemic changes.  No deformity or atrophy.  Neurologic: Sensation grossly intact in extremities.  Symmetrical.  Speech is fluent. Motor exam as listed above. Psychiatric: Judgment intact, Mood & affect appropriate for pt's clinical situation. Dermatologic: No rashes or ulcers noted.  No cellulitis or open wounds.      CBC Lab Results  Component Value Date   WBC 10.1 01/12/2023   HGB 12.0 01/12/2023   HCT 39.4 01/12/2023   MCV 98.7 01/12/2023   PLT 392 01/12/2023    BMET    Component Value Date/Time   NA 136 01/12/2023 1143   K 5.8 (H) 01/12/2023 1143   CL 109 01/12/2023 1143   CO2 18 (L) 01/12/2023 1143   GLUCOSE 133 (H) 01/12/2023 1143   BUN 46 (H) 01/12/2023 1143   CREATININE 3.98 (H) 01/12/2023 1143   CALCIUM 9.6 01/12/2023 1143   GFRNONAA 11 (L) 01/12/2023 1143   CrCl cannot be calculated (Unknown ideal weight.).  COAG Lab Results  Component Value Date   INR 1.0 08/11/2021    Radiology No results found.   Assessment/Plan End-stage renal disease.  We have been asked to place a PermCath for hemodialysis access.  Had a peritoneal dialysis catheter placed but will still need hemodialysis apparently.  To be placed today.  Risks and benefits discussed.  Hypertension.  An underlying cause of renal failure and blood pressure control important in reducing the progression of atherosclerotic disease. On appropriate oral medications.  Diabetes. An underlying cause of her renal failure and blood glucose control important in  reducing the progression of atherosclerotic disease. Also, involved in wound healing. On appropriate medications.    Leotis Pain, MD  01/28/2023 8:39 AM

## 2023-01-28 NOTE — Op Note (Signed)
OPERATIVE NOTE    PRE-OPERATIVE DIAGNOSIS: 1. ESRD   POST-OPERATIVE DIAGNOSIS: same as above  PROCEDURE: Ultrasound guidance for vascular access to the right internal jugular vein Fluoroscopic guidance for placement of catheter Placement of a 19 cm tip to cuff tunneled hemodialysis catheter via the right internal jugular vein  SURGEON: Leotis Pain, MD  ANESTHESIA:  Local with Moderate conscious sedation for approximately 24 minutes using 1 mg of Versed and 50 mcg of Fentanyl  ESTIMATED BLOOD LOSS: 3 cc  FLUORO TIME: 1.4 minutes  CONTRAST: none  FINDING(S): 1.  Patent right internal jugular vein  SPECIMEN(S):  None  INDICATIONS:   Anita Greene is a 72 y.o.female who presents with renal failure and need for hemodialysis access as well is her peritoneal dialysis.  The patient needs long term hemo dialysis access for their ESRD, and a Permcath is necessary.  Risks and benefits are discussed and informed consent is obtained.    DESCRIPTION: After obtaining full informed written consent, the patient was brought back to the vascular suited. The patient's right neck and chest were sterilely prepped and draped in a sterile surgical field was created. Moderate conscious sedation was administered during a face to face encounter with the patient throughout the procedure with my supervision of the RN administering medicines and monitoring the patient's vital signs, pulse oximetry, telemetry and mental status throughout from the start of the procedure until the patient was taken to the recovery room.  The right internal jugular vein was visualized with ultrasound and found to be patent. It was then accessed under direct ultrasound guidance with a micropuncture needle and a permanent image was recorded. A micropuncture wire was placed.  I then placed a micropuncture sheath and upsized to a 0.035 J-wire.  After skin nick and dilatation, the peel-away sheath was placed over the wire. I then turned  my attention to an area under the clavicle. Approximately 1-2 fingerbreadths below the clavicle a small counterincision was created and tunneled from the subclavicular incision to the access site. Using fluoroscopic guidance, a 19 centimeter tip to cuff tunneled hemodialysis catheter was selected, and tunneled from the subclavicular incision to the access site. It was then placed through the peel-away sheath and the peel-away sheath was removed. Using fluoroscopic guidance the catheter tips were parked in the right atrium. The appropriate distal connectors were placed. It withdrew blood well and flushed easily with heparinized saline and a concentrated heparin solution was then placed. It was secured to the chest wall with 2 Prolene sutures. The access incision was closed single 4-0 Monocryl. A 4-0 Monocryl pursestring suture was placed around the exit site. Sterile dressings were placed. The patient tolerated the procedure well and was taken to the recovery room in stable condition.  COMPLICATIONS: None  CONDITION: Stable  Leotis Pain, MD 01/28/2023 10:07 AM   This note was created with Dragon Medical transcription system. Any errors in dictation are purely unintentional.

## 2023-01-29 ENCOUNTER — Other Ambulatory Visit
Admission: RE | Admit: 2023-01-29 | Discharge: 2023-01-29 | Disposition: A | Discharge status: Home / Self Care | Payer: PPO | Attending: Nephrology | Admitting: Nephrology

## 2023-01-29 ENCOUNTER — Encounter: Payer: Self-pay | Admitting: Vascular Surgery

## 2023-01-29 ENCOUNTER — Other Ambulatory Visit: Payer: Self-pay | Admitting: Nephrology

## 2023-01-29 DIAGNOSIS — N186 End stage renal disease: Secondary | ICD-10-CM | POA: Diagnosis not present

## 2023-01-29 LAB — POTASSIUM: Potassium: 5.4 mmol/L — ABNORMAL HIGH (ref 3.5–5.1)

## 2023-01-30 DIAGNOSIS — Z23 Encounter for immunization: Secondary | ICD-10-CM | POA: Diagnosis not present

## 2023-01-30 DIAGNOSIS — D631 Anemia in chronic kidney disease: Secondary | ICD-10-CM | POA: Diagnosis not present

## 2023-01-30 DIAGNOSIS — D509 Iron deficiency anemia, unspecified: Secondary | ICD-10-CM | POA: Diagnosis not present

## 2023-01-30 DIAGNOSIS — Z992 Dependence on renal dialysis: Secondary | ICD-10-CM | POA: Diagnosis not present

## 2023-01-30 DIAGNOSIS — N186 End stage renal disease: Secondary | ICD-10-CM | POA: Diagnosis not present

## 2023-02-01 DIAGNOSIS — N186 End stage renal disease: Secondary | ICD-10-CM | POA: Diagnosis not present

## 2023-02-01 DIAGNOSIS — I120 Hypertensive chronic kidney disease with stage 5 chronic kidney disease or end stage renal disease: Secondary | ICD-10-CM | POA: Diagnosis not present

## 2023-02-23 DIAGNOSIS — N186 End stage renal disease: Secondary | ICD-10-CM | POA: Diagnosis not present

## 2023-02-23 DIAGNOSIS — Z992 Dependence on renal dialysis: Secondary | ICD-10-CM | POA: Diagnosis not present

## 2023-02-24 HISTORY — PX: PERITONEAL CATHETER REMOVAL: SUR1106

## 2023-02-25 DIAGNOSIS — N186 End stage renal disease: Secondary | ICD-10-CM | POA: Diagnosis not present

## 2023-02-25 DIAGNOSIS — Z992 Dependence on renal dialysis: Secondary | ICD-10-CM | POA: Diagnosis not present

## 2023-02-26 DIAGNOSIS — Z888 Allergy status to other drugs, medicaments and biological substances status: Secondary | ICD-10-CM | POA: Diagnosis not present

## 2023-02-26 DIAGNOSIS — Z9851 Tubal ligation status: Secondary | ICD-10-CM | POA: Diagnosis not present

## 2023-02-26 DIAGNOSIS — F32A Depression, unspecified: Secondary | ICD-10-CM | POA: Diagnosis not present

## 2023-02-26 DIAGNOSIS — Z8673 Personal history of transient ischemic attack (TIA), and cerebral infarction without residual deficits: Secondary | ICD-10-CM | POA: Diagnosis not present

## 2023-02-26 DIAGNOSIS — N186 End stage renal disease: Secondary | ICD-10-CM | POA: Diagnosis not present

## 2023-02-26 DIAGNOSIS — Z9109 Other allergy status, other than to drugs and biological substances: Secondary | ICD-10-CM | POA: Diagnosis not present

## 2023-02-26 DIAGNOSIS — Z9049 Acquired absence of other specified parts of digestive tract: Secondary | ICD-10-CM | POA: Diagnosis not present

## 2023-02-26 DIAGNOSIS — Z882 Allergy status to sulfonamides status: Secondary | ICD-10-CM | POA: Diagnosis not present

## 2023-02-26 DIAGNOSIS — I120 Hypertensive chronic kidney disease with stage 5 chronic kidney disease or end stage renal disease: Secondary | ICD-10-CM | POA: Diagnosis not present

## 2023-02-26 DIAGNOSIS — Z885 Allergy status to narcotic agent status: Secondary | ICD-10-CM | POA: Diagnosis not present

## 2023-02-26 DIAGNOSIS — F419 Anxiety disorder, unspecified: Secondary | ICD-10-CM | POA: Diagnosis not present

## 2023-02-26 DIAGNOSIS — Z992 Dependence on renal dialysis: Secondary | ICD-10-CM | POA: Diagnosis not present

## 2023-02-26 DIAGNOSIS — Z79899 Other long term (current) drug therapy: Secondary | ICD-10-CM | POA: Diagnosis not present

## 2023-02-26 DIAGNOSIS — T8571XA Infection and inflammatory reaction due to peritoneal dialysis catheter, initial encounter: Secondary | ICD-10-CM | POA: Diagnosis not present

## 2023-02-26 DIAGNOSIS — Z7982 Long term (current) use of aspirin: Secondary | ICD-10-CM | POA: Diagnosis not present

## 2023-02-26 DIAGNOSIS — Z881 Allergy status to other antibiotic agents status: Secondary | ICD-10-CM | POA: Diagnosis not present

## 2023-03-01 DIAGNOSIS — D509 Iron deficiency anemia, unspecified: Secondary | ICD-10-CM | POA: Diagnosis not present

## 2023-03-01 DIAGNOSIS — Z992 Dependence on renal dialysis: Secondary | ICD-10-CM | POA: Diagnosis not present

## 2023-03-01 DIAGNOSIS — K659 Peritonitis, unspecified: Secondary | ICD-10-CM | POA: Diagnosis not present

## 2023-03-01 DIAGNOSIS — N186 End stage renal disease: Secondary | ICD-10-CM | POA: Diagnosis not present

## 2023-03-01 DIAGNOSIS — D631 Anemia in chronic kidney disease: Secondary | ICD-10-CM | POA: Diagnosis not present

## 2023-03-15 DIAGNOSIS — N186 End stage renal disease: Secondary | ICD-10-CM | POA: Diagnosis not present

## 2023-03-26 DIAGNOSIS — N186 End stage renal disease: Secondary | ICD-10-CM | POA: Diagnosis not present

## 2023-03-26 DIAGNOSIS — Z992 Dependence on renal dialysis: Secondary | ICD-10-CM | POA: Diagnosis not present

## 2023-03-29 DIAGNOSIS — D631 Anemia in chronic kidney disease: Secondary | ICD-10-CM | POA: Diagnosis not present

## 2023-03-29 DIAGNOSIS — Z992 Dependence on renal dialysis: Secondary | ICD-10-CM | POA: Diagnosis not present

## 2023-03-29 DIAGNOSIS — D509 Iron deficiency anemia, unspecified: Secondary | ICD-10-CM | POA: Diagnosis not present

## 2023-03-29 DIAGNOSIS — N186 End stage renal disease: Secondary | ICD-10-CM | POA: Diagnosis not present

## 2023-03-31 ENCOUNTER — Other Ambulatory Visit (INDEPENDENT_AMBULATORY_CARE_PROVIDER_SITE_OTHER): Payer: Self-pay | Admitting: Nurse Practitioner

## 2023-03-31 DIAGNOSIS — N186 End stage renal disease: Secondary | ICD-10-CM

## 2023-04-07 ENCOUNTER — Ambulatory Visit (INDEPENDENT_AMBULATORY_CARE_PROVIDER_SITE_OTHER): Payer: PPO

## 2023-04-07 ENCOUNTER — Ambulatory Visit (INDEPENDENT_AMBULATORY_CARE_PROVIDER_SITE_OTHER): Payer: PPO | Admitting: Nurse Practitioner

## 2023-04-07 ENCOUNTER — Encounter (INDEPENDENT_AMBULATORY_CARE_PROVIDER_SITE_OTHER): Payer: Self-pay | Admitting: Nurse Practitioner

## 2023-04-07 VITALS — BP 137/81 | HR 71 | Resp 16 | Wt 117.4 lb

## 2023-04-07 DIAGNOSIS — N186 End stage renal disease: Secondary | ICD-10-CM | POA: Diagnosis not present

## 2023-04-07 DIAGNOSIS — E1149 Type 2 diabetes mellitus with other diabetic neurological complication: Secondary | ICD-10-CM

## 2023-04-07 DIAGNOSIS — I10 Essential (primary) hypertension: Secondary | ICD-10-CM | POA: Diagnosis not present

## 2023-04-07 NOTE — H&P (View-Only) (Signed)
Subjective:    Patient ID: Anita Greene, female    DOB: 03/13/1951, 72 y.o.   MRN: [id number] Chief Complaint  Patient presents with   Follow-up    Ref [facility] consult with vein mapp     The patient is seen for evaluation for dialysis access.  The patient recently had a PermCath placed.  She has been tolerating recent dialysis well.  The patient was previously considering hemodialysis access about a year ago but she elected to move forward with peritoneal dialysis.  Unfortunately she had multiple issues with her dialysis catheter and ultimately had it removed.  The patient is followed by nephrology.    The patient is right-handed.   No recent shortening of the patient's walking distance or new symptoms consistent with claudication.  No history of rest pain symptoms. No new ulcers or wounds of the lower extremities have occurred.  The patient denies amaurosis fugax or recent TIA symptoms. There are no recent neurological changes noted. There is no history of DVT, PE or superficial thrombophlebitis. No recent episodes of angina or shortness of breath documented.    Today the patient has small veins with adequate access for a graft, this is different from the previous study she had about a year ago.    Review of Systems     Objective:   Physical Exam  BP 137/81 (BP Location: Right Arm)   Pulse 71   Resp 16   Wt 117 lb 6.4 oz (53.3 kg)   BMI 22.93 kg/m   Past Medical History:  Diagnosis Date   Allergic rhinitis due to pollen    Arthritis    B12 deficiency    Chronic kidney disease    Fibromyalgia    GERD (gastroesophageal reflux disease)    Hemiparesis affecting right side as late effect of cerebrovascular accident (CVA) (HCC)    Hypertension    IBS (irritable bowel syndrome)    diarrhea prone   Iron deficiency anemia    Migraine headache    Mood disorder (HCC)    Obstructive sleep apnea    CPAP 11 (auto titrate)   Optic atrophy of right eye    PFO (patent  foramen ovale)    RLS (restless legs syndrome)    Sleep disturbance    Stroke (HCC)    Type 2 diabetes mellitus with neurological manifestations, controlled (HCC)     Social History   Socioeconomic History   Marital status: Widowed    Spouse name: Not on file   Number of children: 1   Years of education: Not on file   Highest education level: Not on file  Occupational History   Occupation: [employer]  Tobacco Use   Smoking status: Never   Smokeless tobacco: Never  Vaping Use   Vaping Use: Never used  Substance and Sexual Activity   Alcohol use: Never    Alcohol/week: 0.0 standard drinks of alcohol   Drug use: Not on file   Sexual activity: Not on file  Other Topics Concern   Not on file  Social History Narrative   1 son died    1 son living--2 grandchildren      Had living will--not sure about it   No formal health care POA--would want son   Would accept resuscitation   No prolonged tube feeds if cognitively unaware   Social Determinants of Health   Financial Resource Strain: Not on file  Food Insecurity: Not on file  Transportation Needs: Not on  file  Physical Activity: Not on file  Stress: Not on file  Social Connections: Not on file  Intimate Partner Violence: Not on file    Past Surgical History:  Procedure Laterality Date   CATARACT EXTRACTION W/ INTRAOCULAR LENS  IMPLANT, BILATERAL     CHOLECYSTECTOMY     COLONOSCOPY     COLONOSCOPY WITH PROPOFOL N/A 04/04/2018   Procedure: COLONOSCOPY WITH PROPOFOL;  Surgeon: Christena Deem, MD;  Location: [facility] ENDOSCOPY;  Service: Endoscopy;  Laterality: N/A;   DIALYSIS/PERMA CATHETER INSERTION N/A 01/23/2022   Procedure: DIALYSIS/PERMA CATHETER INSERTION;  Surgeon: Annice Needy, MD;  Location: [facility] INVASIVE CV LAB;  Service: Cardiovascular;  Laterality: N/A;   DIALYSIS/PERMA CATHETER INSERTION N/A 01/28/2023   Procedure: DIALYSIS/PERMA CATHETER INSERTION;  Surgeon: Annice Needy, MD;  Location: [facility] INVASIVE  CV LAB;  Service: Cardiovascular;  Laterality: N/A;   DIALYSIS/PERMA CATHETER REMOVAL N/A 06/15/2022   Procedure: DIALYSIS/PERMA CATHETER REMOVAL;  Surgeon: Annice Needy, MD;  Location: [facility] INVASIVE CV LAB;  Service: Cardiovascular;  Laterality: N/A;   ESOPHAGOGASTRODUODENOSCOPY (EGD) WITH PROPOFOL N/A 04/04/2018   Procedure: ESOPHAGOGASTRODUODENOSCOPY (EGD) WITH PROPOFOL;  Surgeon: Christena Deem, MD;  Location: [facility] ENDOSCOPY;  Service: Endoscopy;  Laterality: N/A;   HERNIA REPAIR     NISSEN FUNDOPLICATION     TONSILLECTOMY     TUBAL LIGATION     UPPER GASTROINTESTINAL ENDOSCOPY     Uroplasty  ~2000's   Dr Lonna Cobb    Family History  Problem Relation Age of Onset   Cancer Mother        lung cancer   Parkinson's disease Father    Dementia Father    COPD Father    Heart disease Father    Cancer Sister        lung cancer   Cancer Son        Ewing's sarcoma   Breast cancer Neg Hx     Allergies  Allergen Reactions   Cefditoren Pivoxil     Other reaction(s): Other (See Comments) GI issues, severe abdominal pain   Clarithromycin     Other reaction(s): Other (See Comments) GI issues   Codeine Sulfate Other (See Comments)   Erythromycin Other (See Comments)   Other Other (See Comments)    Oral iron   Pseudoephedrine     ams   Sulfa Antibiotics Itching       Latest Ref Rng & Units 01/12/2023   11:43 AM 02/13/2022    9:13 AM 01/27/2022    4:00 AM  CBC  WBC 4.0 - 10.5 K/uL 10.1   11.3   Hemoglobin 12.0 - 15.0 g/dL 84.6  7.1  9.5   Hematocrit 36.0 - 46.0 % 39.4   29.0   Platelets 150 - 400 K/uL 392   261       CMP     Component Value Date/Time   NA 136 01/12/2023 1143   K 5.4 (H) 01/29/2023 1400   CL 109 01/12/2023 1143   CO2 18 (L) 01/12/2023 1143   GLUCOSE 133 (H) 01/12/2023 1143   BUN 46 (H) 01/12/2023 1143   CREATININE 3.98 (H) 01/12/2023 1143   CALCIUM 9.6 01/12/2023 1143   PROT 7.0 01/21/2022 1036   ALBUMIN 3.4 (L) 01/12/2023 1143   AST 20 01/21/2022  1036   ALT 15 01/21/2022 1036   ALKPHOS 88 01/21/2022 1036   BILITOT 0.7 01/21/2022 1036   GFRNONAA 11 (L) 01/12/2023 1143     No results found.  Assessment & Plan:   1. ESRD (end stage renal disease) (HCC) Recommend:  At this time the patient does not have appropriate extremity access for dialysis  Patient should have a left brachial axillary AV graft inserted, however if her vein diameters found to be adequate during surgery, they have a brachiocephalic AV fistula placed.    The risks, benefits and alternative therapies were reviewed in detail with the patient.  All questions were answered.  The patient agrees to proceed with surgery.   The patient will follow up with me in the office after the surgery.   2. Primary hypertension Continue antihypertensive medications as already ordered, these medications have been reviewed and there are no changes at this time.   3. Type 2 diabetes mellitus with neurological manifestations, controlled (HCC) Continue hypoglycemic medications as already ordered, these medications have been reviewed and there are no changes at this time.  Hgb A1C to be monitored as already arranged by primary service    Current Outpatient Medications on File Prior to Visit  Medication Sig Dispense Refill   ascorbic acid (VITAMIN C) 500 MG tablet Take 500 mg by mouth daily.     aspirin EC 81 MG tablet Take 81 mg by mouth daily.      buPROPion (WELLBUTRIN XL) 150 MG 24 hr tablet Take 300 mg by mouth daily.     busPIRone (BUSPAR) 15 MG tablet Take 15 mg by mouth 3 (three) times daily.     carvedilol (COREG) 6.25 MG tablet Take 6.25 mg by mouth 2 (two) times daily with a meal.     cetirizine (ZYRTEC) 10 MG tablet Take 1 tablet (10 mg total) by mouth daily. 90 tablet 3   cholecalciferol (VITAMIN D3) 10 MCG (400 UNIT) TABS tablet Take 2,000 Units by mouth daily.     DULoxetine (CYMBALTA) 30 MG capsule Take 1 capsule (30 mg total) by mouth daily. 90 capsule 3    famotidine (PEPCID) 20 MG tablet Take 20 mg by mouth at bedtime.     gabapentin (NEURONTIN) 300 MG capsule TAKE 1 CAPSULE BY MOUTH 2 TIMES DAILY (Patient taking differently: Take 100 mg by mouth.) 180 capsule 0   glucose blood (FREESTYLE LITE) test strip USE AS DIRECTED TO CHECK BLOOD SUGAR ONCE DAILY 100 each 0   glucose blood (FREESTYLE LITE) test strip USE AS DIRECTED TO CHECK BLOOD SUGAR ONCE DAILY     magnesium gluconate (MAGONATE) 500 MG tablet Take 500 mg by mouth daily.     Multiple Vitamin (MULTIVITAMIN) tablet Take 1 tablet by mouth daily.     Multiple Vitamins-Minerals (ICAPS) TABS Take by mouth.     multivitamin (RENA-VIT) TABS tablet Take 1 tablet by mouth at bedtime.     sennosides-docusate sodium (SENOKOT-S) 8.6-50 MG tablet Take 1 tablet by mouth daily. Do not take this med within 2 hours of other meds     spironolactone (ALDACTONE) 25 MG tablet Take 25 mg by mouth daily.     verapamil (CALAN) 40 MG tablet Take 20 mg by mouth as directed. Tuesday, Wednesday, Thursday,Saturday,Sunday     vitamin B-12 (CYANOCOBALAMIN) 500 MCG tablet Take 500 mcg by mouth daily.     zolpidem (AMBIEN) 5 MG tablet Take 5 mg by mouth at bedtime as needed for sleep.     Zoster Vaccine Adjuvanted (SHINGRIX) injection      diphenoxylate-atropine (LOMOTIL) 2.5-0.025 MG tablet TAKE 1 TABLET BY MOUTH 4 TIMES DAILY AS NEEDED FOR DIARRHEA OR LOOSE STOOLS. (Patient not taking:  Reported on 01/28/2023) 90 tablet 0   ferrous sulfate 325 (65 FE) MG tablet Take 325 mg by mouth 2 (two) times daily with a meal. (Patient not taking: Reported on 02/13/2022)     midodrine (PROAMATINE) 10 MG tablet Take 10 mg by mouth 3 (three) times daily. (Patient not taking: Reported on 01/28/2023)     Nutritional Supplements (FEEDING SUPPLEMENT, NEPRO CARB STEADY,) LIQD Take 237 mLs by mouth at bedtime. (Patient not taking: Reported on 02/17/2022) 237 mL 0   No current facility-administered medications on file prior to visit.    There are  no Patient Instructions on file for this visit. No follow-ups on file.   Georgiana Spinner, NP

## 2023-04-07 NOTE — Progress Notes (Signed)
 Subjective:    Patient ID: Anita Greene, female    DOB: 06/21/1951, 72 y.o.   MRN: [id number] Chief Complaint  Patient presents with   Follow-up    Ref [facility] consult with vein mapp     The patient is seen for evaluation for dialysis access.  The patient recently had a PermCath placed.  She has been tolerating recent dialysis well.  The patient was previously considering hemodialysis access about a year ago but she elected to move forward with peritoneal dialysis.  Unfortunately she had multiple issues with her dialysis catheter and ultimately had it removed.  The patient is followed by nephrology.    The patient is right-handed.   No recent shortening of the patient's walking distance or new symptoms consistent with claudication.  No history of rest pain symptoms. No new ulcers or wounds of the lower extremities have occurred.  The patient denies amaurosis fugax or recent TIA symptoms. There are no recent neurological changes noted. There is no history of DVT, PE or superficial thrombophlebitis. No recent episodes of angina or shortness of breath documented.    Today the patient has small veins with adequate access for a graft, this is different from the previous study she had about a year ago.    Review of Systems     Objective:   Physical Exam  BP 137/81 (BP Location: Right Arm)   Pulse 71   Resp 16   Wt 117 lb 6.4 oz (53.3 kg)   BMI 22.93 kg/m   Past Medical History:  Diagnosis Date   Allergic rhinitis due to pollen    Arthritis    B12 deficiency    Chronic kidney disease    Fibromyalgia    GERD (gastroesophageal reflux disease)    Hemiparesis affecting right side as late effect of cerebrovascular accident (CVA) (HCC)    Hypertension    IBS (irritable bowel syndrome)    diarrhea prone   Iron deficiency anemia    Migraine headache    Mood disorder (HCC)    Obstructive sleep apnea    CPAP 11 (auto titrate)   Optic atrophy of right eye    PFO (patent  foramen ovale)    RLS (restless legs syndrome)    Sleep disturbance    Stroke (HCC)    Type 2 diabetes mellitus with neurological manifestations, controlled (HCC)     Social History   Socioeconomic History   Marital status: Widowed    Spouse name: Not on file   Number of children: 1   Years of education: Not on file   Highest education level: Not on file  Occupational History   Occupation: [employer]  Tobacco Use   Smoking status: Never   Smokeless tobacco: Never  Vaping Use   Vaping Use: Never used  Substance and Sexual Activity   Alcohol use: Never    Alcohol/week: 0.0 standard drinks of alcohol   Drug use: Not on file   Sexual activity: Not on file  Other Topics Concern   Not on file  Social History Narrative   1 son died    1 son living--2 grandchildren      Had living will--not sure about it   No formal health care POA--would want son   Would accept resuscitation   No prolonged tube feeds if cognitively unaware   Social Determinants of Health   Financial Resource Strain: Not on file  Food Insecurity: Not on file  Transportation Needs: Not on   file  Physical Activity: Not on file  Stress: Not on file  Social Connections: Not on file  Intimate Partner Violence: Not on file    Past Surgical History:  Procedure Laterality Date   CATARACT EXTRACTION W/ INTRAOCULAR LENS  IMPLANT, BILATERAL     CHOLECYSTECTOMY     COLONOSCOPY     COLONOSCOPY WITH PROPOFOL N/A 04/04/2018   Procedure: COLONOSCOPY WITH PROPOFOL;  Surgeon: Skulskie, Martin U, MD;  Location: [facility] ENDOSCOPY;  Service: Endoscopy;  Laterality: N/A;   DIALYSIS/PERMA CATHETER INSERTION N/A 01/23/2022   Procedure: DIALYSIS/PERMA CATHETER INSERTION;  Surgeon: Dew, Jason S, MD;  Location: [facility] INVASIVE CV LAB;  Service: Cardiovascular;  Laterality: N/A;   DIALYSIS/PERMA CATHETER INSERTION N/A 01/28/2023   Procedure: DIALYSIS/PERMA CATHETER INSERTION;  Surgeon: Dew, Jason S, MD;  Location: [facility] INVASIVE  CV LAB;  Service: Cardiovascular;  Laterality: N/A;   DIALYSIS/PERMA CATHETER REMOVAL N/A 06/15/2022   Procedure: DIALYSIS/PERMA CATHETER REMOVAL;  Surgeon: Dew, Jason S, MD;  Location: [facility] INVASIVE CV LAB;  Service: Cardiovascular;  Laterality: N/A;   ESOPHAGOGASTRODUODENOSCOPY (EGD) WITH PROPOFOL N/A 04/04/2018   Procedure: ESOPHAGOGASTRODUODENOSCOPY (EGD) WITH PROPOFOL;  Surgeon: Skulskie, Martin U, MD;  Location: [facility] ENDOSCOPY;  Service: Endoscopy;  Laterality: N/A;   HERNIA REPAIR     NISSEN FUNDOPLICATION     TONSILLECTOMY     TUBAL LIGATION     UPPER GASTROINTESTINAL ENDOSCOPY     Uroplasty  ~2000's   Dr Stoioff    Family History  Problem Relation Age of Onset   Cancer Mother        lung cancer   Parkinson's disease Father    Dementia Father    COPD Father    Heart disease Father    Cancer Sister        lung cancer   Cancer Son        Ewing's sarcoma   Breast cancer Neg Hx     Allergies  Allergen Reactions   Cefditoren Pivoxil     Other reaction(s): Other (See Comments) GI issues, severe abdominal pain   Clarithromycin     Other reaction(s): Other (See Comments) GI issues   Codeine Sulfate Other (See Comments)   Erythromycin Other (See Comments)   Other Other (See Comments)    Oral iron   Pseudoephedrine     ams   Sulfa Antibiotics Itching       Latest Ref Rng & Units 01/12/2023   11:43 AM 02/13/2022    9:13 AM 01/27/2022    4:00 AM  CBC  WBC 4.0 - 10.5 K/uL 10.1   11.3   Hemoglobin 12.0 - 15.0 g/dL 12.0  7.1  9.5   Hematocrit 36.0 - 46.0 % 39.4   29.0   Platelets 150 - 400 K/uL 392   261       CMP     Component Value Date/Time   NA 136 01/12/2023 1143   K 5.4 (H) 01/29/2023 1400   CL 109 01/12/2023 1143   CO2 18 (L) 01/12/2023 1143   GLUCOSE 133 (H) 01/12/2023 1143   BUN 46 (H) 01/12/2023 1143   CREATININE 3.98 (H) 01/12/2023 1143   CALCIUM 9.6 01/12/2023 1143   PROT 7.0 01/21/2022 1036   ALBUMIN 3.4 (L) 01/12/2023 1143   AST 20 01/21/2022  1036   ALT 15 01/21/2022 1036   ALKPHOS 88 01/21/2022 1036   BILITOT 0.7 01/21/2022 1036   GFRNONAA 11 (L) 01/12/2023 1143     No results found.       Assessment & Plan:   1. ESRD (end stage renal disease) (HCC) Recommend:  At this time the patient does not have appropriate extremity access for dialysis  Patient should have a left brachial axillary AV graft inserted, however if her vein diameters found to be adequate during surgery, they have a brachiocephalic AV fistula placed.    The risks, benefits and alternative therapies were reviewed in detail with the patient.  All questions were answered.  The patient agrees to proceed with surgery.   The patient will follow up with me in the office after the surgery.   2. Primary hypertension Continue antihypertensive medications as already ordered, these medications have been reviewed and there are no changes at this time.   3. Type 2 diabetes mellitus with neurological manifestations, controlled (HCC) Continue hypoglycemic medications as already ordered, these medications have been reviewed and there are no changes at this time.  Hgb A1C to be monitored as already arranged by primary service    Current Outpatient Medications on File Prior to Visit  Medication Sig Dispense Refill   ascorbic acid (VITAMIN C) 500 MG tablet Take 500 mg by mouth daily.     aspirin EC 81 MG tablet Take 81 mg by mouth daily.      buPROPion (WELLBUTRIN XL) 150 MG 24 hr tablet Take 300 mg by mouth daily.     busPIRone (BUSPAR) 15 MG tablet Take 15 mg by mouth 3 (three) times daily.     carvedilol (COREG) 6.25 MG tablet Take 6.25 mg by mouth 2 (two) times daily with a meal.     cetirizine (ZYRTEC) 10 MG tablet Take 1 tablet (10 mg total) by mouth daily. 90 tablet 3   cholecalciferol (VITAMIN D3) 10 MCG (400 UNIT) TABS tablet Take 2,000 Units by mouth daily.     DULoxetine (CYMBALTA) 30 MG capsule Take 1 capsule (30 mg total) by mouth daily. 90 capsule 3    famotidine (PEPCID) 20 MG tablet Take 20 mg by mouth at bedtime.     gabapentin (NEURONTIN) 300 MG capsule TAKE 1 CAPSULE BY MOUTH 2 TIMES DAILY (Patient taking differently: Take 100 mg by mouth.) 180 capsule 0   glucose blood (FREESTYLE LITE) test strip USE AS DIRECTED TO CHECK BLOOD SUGAR ONCE DAILY 100 each 0   glucose blood (FREESTYLE LITE) test strip USE AS DIRECTED TO CHECK BLOOD SUGAR ONCE DAILY     magnesium gluconate (MAGONATE) 500 MG tablet Take 500 mg by mouth daily.     Multiple Vitamin (MULTIVITAMIN) tablet Take 1 tablet by mouth daily.     Multiple Vitamins-Minerals (ICAPS) TABS Take by mouth.     multivitamin (RENA-VIT) TABS tablet Take 1 tablet by mouth at bedtime.     sennosides-docusate sodium (SENOKOT-S) 8.6-50 MG tablet Take 1 tablet by mouth daily. Do not take this med within 2 hours of other meds     spironolactone (ALDACTONE) 25 MG tablet Take 25 mg by mouth daily.     verapamil (CALAN) 40 MG tablet Take 20 mg by mouth as directed. Tuesday, Wednesday, Thursday,Saturday,Sunday     vitamin B-12 (CYANOCOBALAMIN) 500 MCG tablet Take 500 mcg by mouth daily.     zolpidem (AMBIEN) 5 MG tablet Take 5 mg by mouth at bedtime as needed for sleep.     Zoster Vaccine Adjuvanted (SHINGRIX) injection      diphenoxylate-atropine (LOMOTIL) 2.5-0.025 MG tablet TAKE 1 TABLET BY MOUTH 4 TIMES DAILY AS NEEDED FOR DIARRHEA OR LOOSE STOOLS. (Patient not taking:   Reported on 01/28/2023) 90 tablet 0   ferrous sulfate 325 (65 FE) MG tablet Take 325 mg by mouth 2 (two) times daily with a meal. (Patient not taking: Reported on 02/13/2022)     midodrine (PROAMATINE) 10 MG tablet Take 10 mg by mouth 3 (three) times daily. (Patient not taking: Reported on 01/28/2023)     Nutritional Supplements (FEEDING SUPPLEMENT, NEPRO CARB STEADY,) LIQD Take 237 mLs by mouth at bedtime. (Patient not taking: Reported on 02/17/2022) 237 mL 0   No current facility-administered medications on file prior to visit.    There are  no Patient Instructions on file for this visit. No follow-ups on file.   Alphons Burgert E Maymuna Detzel, NP   

## 2023-04-09 ENCOUNTER — Other Ambulatory Visit (INDEPENDENT_AMBULATORY_CARE_PROVIDER_SITE_OTHER): Payer: Self-pay | Admitting: Nurse Practitioner

## 2023-04-09 ENCOUNTER — Telehealth (INDEPENDENT_AMBULATORY_CARE_PROVIDER_SITE_OTHER): Payer: Self-pay

## 2023-04-09 DIAGNOSIS — N186 End stage renal disease: Secondary | ICD-10-CM

## 2023-04-09 NOTE — Telephone Encounter (Signed)
Spoke with the patient and she is scheduled with Dr. Wyn Quaker on 04/14/23 for a left brachial axillary graft at [facility]. Pre-op is on 04/12/23 at 2:00 pm (patient stated she will call, as she has transportation issues). Pre-surgical instructions will be sent to Mychart.

## 2023-04-12 ENCOUNTER — Telehealth: Payer: Self-pay | Admitting: Urgent Care

## 2023-04-12 ENCOUNTER — Encounter
Admission: RE | Admit: 2023-04-12 | Discharge: 2023-04-12 | Disposition: A | Discharge status: Home / Self Care | Payer: PPO | Source: Ambulatory Visit | Attending: Vascular Surgery | Admitting: Vascular Surgery

## 2023-04-12 DIAGNOSIS — N186 End stage renal disease: Secondary | ICD-10-CM | POA: Insufficient documentation

## 2023-04-12 DIAGNOSIS — Z01818 Encounter for other preprocedural examination: Secondary | ICD-10-CM | POA: Diagnosis not present

## 2023-04-12 DIAGNOSIS — Z79899 Other long term (current) drug therapy: Secondary | ICD-10-CM

## 2023-04-12 DIAGNOSIS — E876 Hypokalemia: Secondary | ICD-10-CM

## 2023-04-12 DIAGNOSIS — Z01812 Encounter for preprocedural laboratory examination: Secondary | ICD-10-CM

## 2023-04-12 DIAGNOSIS — Z01810 Encounter for preprocedural cardiovascular examination: Secondary | ICD-10-CM | POA: Diagnosis not present

## 2023-04-12 HISTORY — DX: Secondary hyperparathyroidism of renal origin: N25.81

## 2023-04-12 HISTORY — DX: Depression, unspecified: F32.A

## 2023-04-12 HISTORY — DX: Unspecified macular degeneration: H35.30

## 2023-04-12 HISTORY — DX: Pneumonia, unspecified organism: J18.9

## 2023-04-12 HISTORY — DX: Polyp of colon: K63.5

## 2023-04-12 HISTORY — DX: Age-related osteoporosis without current pathological fracture: M81.0

## 2023-04-12 HISTORY — DX: Hyperlipidemia, unspecified: E78.5

## 2023-04-12 HISTORY — DX: End stage renal disease: N18.6

## 2023-04-12 HISTORY — DX: Irritable bowel syndrome with diarrhea: K58.0

## 2023-04-12 HISTORY — DX: Diaphragmatic hernia without obstruction or gangrene: K44.9

## 2023-04-12 HISTORY — DX: Cortical age-related cataract, unspecified eye: H25.019

## 2023-04-12 LAB — CBC WITH DIFFERENTIAL/PLATELET
Abs Immature Granulocytes: 0.03 10*3/uL (ref 0.00–0.07)
Basophils Absolute: 0.1 10*3/uL (ref 0.0–0.1)
Basophils Relative: 1 %
Eosinophils Absolute: 0.2 10*3/uL (ref 0.0–0.5)
Eosinophils Relative: 3 %
HCT: 42.9 % (ref 36.0–46.0)
Hemoglobin: 13.8 g/dL (ref 12.0–15.0)
Immature Granulocytes: 0 %
Lymphocytes Relative: 25 %
Lymphs Abs: 1.8 10*3/uL (ref 0.7–4.0)
MCH: 30.7 pg (ref 26.0–34.0)
MCHC: 32.2 g/dL (ref 30.0–36.0)
MCV: 95.3 fL (ref 80.0–100.0)
Monocytes Absolute: 0.6 10*3/uL (ref 0.1–1.0)
Monocytes Relative: 8 %
Neutro Abs: 4.6 10*3/uL (ref 1.7–7.7)
Neutrophils Relative %: 63 %
Platelets: 238 10*3/uL (ref 150–400)
RBC: 4.5 MIL/uL (ref 3.87–5.11)
RDW: 14.7 % (ref 11.5–15.5)
WBC: 7.3 10*3/uL (ref 4.0–10.5)
nRBC: 0 % (ref 0.0–0.2)

## 2023-04-12 LAB — BASIC METABOLIC PANEL
Anion gap: 12 (ref 5–15)
BUN: 20 mg/dL (ref 8–23)
CO2: 22 mmol/L (ref 22–32)
Calcium: 8.3 mg/dL — ABNORMAL LOW (ref 8.9–10.3)
Chloride: 102 mmol/L (ref 98–111)
Creatinine, Ser: 2.11 mg/dL — ABNORMAL HIGH (ref 0.44–1.00)
GFR, Estimated: 25 mL/min — ABNORMAL LOW (ref >60)
Glucose, Bld: 123 mg/dL — ABNORMAL HIGH (ref 70–99)
Potassium: 2.8 mmol/L — ABNORMAL LOW (ref 3.5–5.1)
Sodium: 136 mmol/L (ref 135–145)

## 2023-04-12 LAB — TYPE AND SCREEN
ABO/RH(D): A POS
Antibody Screen: NEGATIVE

## 2023-04-12 MED ORDER — POTASSIUM CHLORIDE CRYS ER 20 MEQ PO TBCR
EXTENDED_RELEASE_TABLET | ORAL | 0 refills | Status: AC
Start: 2023-04-12 — End: ?

## 2023-04-12 NOTE — Patient Instructions (Signed)
Your procedure is scheduled on: Wednesday, June 19 Report to the Registration Desk on the 1st floor of the [facility] To find out your arrival time, please call [phone] between 1PM - 3PM on: Tuesday, June 18 If your arrival time is 6:00 am, do not arrive before that time as the [facility] entrance doors do not open until 6:00 am.  REMEMBER: Instructions that are not followed completely may result in serious medical risk, up to and including death; or upon the discretion of your surgeon and anesthesiologist your surgery may need to be rescheduled.  Do not eat or drink after midnight the night before surgery.  No gum chewing or hard candies.  One week prior to surgery: starting today, June 17 Stop Anti-inflammatories (NSAIDS) such as Advil, Aleve, Ibuprofen, Motrin, Naproxen, Naprosyn and Aspirin based products such as Excedrin, Goody's Powder, BC Powder. Stop ANY OVER THE COUNTER supplements until after surgery.  You may however, continue to take Tylenol if needed for pain up until the day of surgery.  Continue taking all prescribed medications  TAKE ONLY THESE MEDICATIONS THE MORNING OF SURGERY WITH A SIP OF WATER:  Buspirone Carvedilol Famotidine (Pepcid) - (take one the night before and one on the morning of surgery - helps to prevent nausea after surgery.)  No Alcohol for 24 hours before or after surgery.  No Smoking including e-cigarettes for 24 hours before surgery.  No chewable tobacco products for at least 6 hours before surgery.  No nicotine patches on the day of surgery.  Do not use any "recreational" drugs for at least a week (preferably 2 weeks) before your surgery.  Please be advised that the combination of cocaine and anesthesia may have negative outcomes, up to and including death. If you test positive for cocaine, your surgery will be cancelled.  On the morning of surgery brush your teeth with toothpaste and water, you may rinse your mouth with mouthwash  if you wish. Do not swallow any toothpaste or mouthwash.  Use CHG Soap or wipes as directed on instruction sheet.  Do not wear jewelry, make-up, hairpins, clips or nail polish.  Do not wear lotions, powders, or perfumes.   Do not shave body hair from the neck down 48 hours before surgery.  Contact lenses, hearing aids and dentures may not be worn into surgery.  Do not bring valuables to the hospital. [facility] is not responsible for any missing/lost belongings or valuables.   Notify your doctor if there is any change in your medical condition (cold, fever, infection).  Wear comfortable clothing (specific to your surgery type) to the hospital.  After surgery, you can help prevent lung complications by doing breathing exercises.  Take deep breaths and cough every 1-2 hours. Your doctor may order a device called an Incentive Spirometer to help you take deep breaths.  If you are being discharged the day of surgery, you will not be allowed to drive home. You will need a responsible individual to drive you home and stay with you for 24 hours after surgery.   If you are taking public transportation, you will need to have a responsible individual with you.  Please call the Pre-admissions Testing Dept. at [phone] if you have any questions about these instructions.  Surgery Visitation Policy:  Patients having surgery or a procedure may have two visitors.  Children under the age of 42 must have an adult with them who is not the patient.     Preparing for Surgery  with CHLORHEXIDINE GLUCONATE (CHG) Soap  Chlorhexidine Gluconate (CHG) Soap  o An antiseptic cleaner that kills germs and bonds with the skin to continue killing germs even after washing  o Used for showering the night before surgery and morning of surgery  Before surgery, you can play an important role by reducing the number of germs on your skin.  CHG (Chlorhexidine gluconate) soap is an antiseptic cleanser which  kills germs and bonds with the skin to continue killing germs even after washing.  Please do not use if you have an allergy to CHG or antibacterial soaps. If your skin becomes reddened/irritated stop using the CHG.  1. Shower the NIGHT BEFORE SURGERY and the MORNING OF SURGERY with CHG soap.  2. If you choose to wash your hair, wash your hair first as usual with your normal shampoo.  3. After shampooing, rinse your hair and body thoroughly to remove the shampoo.  4. Use CHG as you would any other liquid soap. You can apply CHG directly to the skin and wash gently with a scrungie or a clean washcloth.  5. Apply the CHG soap to your body only from the neck down. Do not use on open wounds or open sores. Avoid contact with your eyes, ears, mouth, and genitals (private parts). Wash face and genitals (private parts) with your normal soap.  6. Wash thoroughly, paying special attention to the area where your surgery will be performed.  7. Thoroughly rinse your body with warm water.  8. Do not shower/wash with your normal soap after using and rinsing off the CHG soap.  9. Pat yourself dry with a clean towel.  10. Wear clean pajamas to bed the night before surgery.  12. Place clean sheets on your bed the night of your first shower and do not sleep with pets.  13. Shower again with the CHG soap on the day of surgery prior to arriving at the hospital.  14. Do not apply any deodorants/lotions/powders.  15. Please wear clean clothes to the hospital.

## 2023-04-12 NOTE — Progress Notes (Signed)
  [facility] Perioperative Services: Pre-Admission/Anesthesia Testing  Abnormal Lab Notification and Treatment Plan of Care   Date: 04/12/23  Name: Anita Greene MRN:   [id number]  Re: Abnormal labs noted during PAT appointment   Notified:  Provider Name Provider Role Notification Mode  Festus Barren, MD Vascular Surgery (Surgeon) Routed and/or faxed via Tollie Pizza, FNP-C Vascular Surgery APP Routed and/or faxed via [facility]   Clinical Information and Notes:  ABNORMAL LAB VALUE(S): Lab Results  Component Value Date   K 2.8 (L) 04/12/2023   Anita Greene is scheduled for INSERTION OF ARTERIOVENOUS (AV) GORE-TEX GRAFT ARM (BRACHIAL AXILLARY) on 04/14/2023. In review of her medication reconciliation, it is noted that the patient is taking prescribed diuretic medications (spironolactone) daily.   Please note, in efforts to promote a safe and effective anesthetic course, per current guidelines/standards set by the [facility] anesthesia team, the minimal acceptable K+ level for the patient to proceed with general anesthesia is 3.0 mmol/L. With that being said, she is already too low for surgery without optimization. In efforts to prevent case cancellation, will make efforts to optimize pre-surgical K+ level so that patient can safely undergo the planned surgical intervention.   Impression and Plan:  Anita Greene found to be HYPOkalemic at 2.8 mmol/L on preoperative labs. She is on K+ sparing diuretic therapy. Patient has a reduced renal function. Will augment dosing to account for her decrease in renal function and also her K+ sparing diuretic therapy. Called patient to discuss results and plans for correction of noted electrolyte derangement. No answer; LMOM.   Meds ordered this encounter  Medications   potassium chloride SA (KLOR-CON M) 20 MEQ tablet    Sig: Take 2 tablets (40 mEq) today, then 1 tablet (20 mEq) BID tomorrow, then 1 tablet (20 mEq) on the day of  surgery. Follow up with PCP/nephrology for repeat labs.    Dispense:  5 tablet    Refill:  0    Please contact patient once Rx is filled and ready. This is for preoperative optimization and needs to be started ASAP.   Encouraged patient to follow up with PCP and/or nephrology postoperatively to have labs rechecked to ensure that levels are remaining within normal range. Discussed nutritional intake of K+ rich foods as an adjunctive way to keep her K+ levels normal; list of K+ rich foods verbally provided.  Will send copy of this note to surgeon and PCP to make them aware of K+ level and plans for correction. Order entered to recheck K+ on the day of her surgery to ensure optimization. Wished patient the best of luck with her upcoming surgery and subsequent recovery. She was encouraged to return call to the PAT clinic, or to her surgeon's office, should any questions or concerns arise between now and the time of her surgery. Patient was appreciative of the care/concern expressed by PAT staff.   Encounter Diagnoses  Name Primary?   Pre-operative laboratory examination Yes   Hypokalemia    ESRD on dialysis (HCC)    On potassium sparing diuretic therapy    Quentin Mulling, MSN, APRN, FNP-C, CEN [facility]  Peri-operative Services Nurse Practitioner Phone: [phone] 04/12/23 4:40 PM  NOTE: This note has been prepared using Dragon dictation software. Despite my best ability to proofread, there is always the potential that unintentional transcriptional errors may still occur from this process.

## 2023-04-13 MED ORDER — SODIUM CHLORIDE 0.9 % IV SOLN: INTRAVENOUS | Status: DC

## 2023-04-13 MED ORDER — CHLORHEXIDINE GLUCONATE 0.12 % MT SOLN
15.0000 mL | Freq: Once | OROMUCOSAL | Status: AC
  Administered 2023-04-14: 15 mL via OROMUCOSAL

## 2023-04-13 MED ORDER — CHLORHEXIDINE GLUCONATE CLOTH 2 % EX PADS: 6.0000 | MEDICATED_PAD | Freq: Once | CUTANEOUS | Status: DC

## 2023-04-13 MED ORDER — VANCOMYCIN HCL IN DEXTROSE 1-5 GM/200ML-% IV SOLN
1000.0000 mg | INTRAVENOUS | Status: AC
  Administered 2023-04-14: 1000 mg via INTRAVENOUS

## 2023-04-13 MED ORDER — ORAL CARE MOUTH RINSE: 15.0000 mL | Freq: Once | OROMUCOSAL | Status: AC

## 2023-04-14 ENCOUNTER — Other Ambulatory Visit: Payer: Self-pay

## 2023-04-14 ENCOUNTER — Encounter
Admission: RE | Disposition: A | Discharge status: Home / Self Care | Payer: Self-pay | Source: Home / Self Care | Attending: Vascular Surgery

## 2023-04-14 ENCOUNTER — Ambulatory Visit: Payer: PPO | Admitting: Urgent Care

## 2023-04-14 ENCOUNTER — Ambulatory Visit
Admission: RE | Admit: 2023-04-14 | Discharge: 2023-04-14 | Disposition: A | Discharge status: Home / Self Care | Payer: PPO | Attending: Vascular Surgery | Admitting: Vascular Surgery

## 2023-04-14 ENCOUNTER — Encounter: Payer: Self-pay | Admitting: Vascular Surgery

## 2023-04-14 DIAGNOSIS — E1122 Type 2 diabetes mellitus with diabetic chronic kidney disease: Secondary | ICD-10-CM | POA: Diagnosis not present

## 2023-04-14 DIAGNOSIS — K219 Gastro-esophageal reflux disease without esophagitis: Secondary | ICD-10-CM | POA: Insufficient documentation

## 2023-04-14 DIAGNOSIS — Z01812 Encounter for preprocedural laboratory examination: Secondary | ICD-10-CM

## 2023-04-14 DIAGNOSIS — I120 Hypertensive chronic kidney disease with stage 5 chronic kidney disease or end stage renal disease: Secondary | ICD-10-CM | POA: Diagnosis not present

## 2023-04-14 DIAGNOSIS — F32A Depression, unspecified: Secondary | ICD-10-CM | POA: Diagnosis not present

## 2023-04-14 DIAGNOSIS — M797 Fibromyalgia: Secondary | ICD-10-CM | POA: Diagnosis not present

## 2023-04-14 DIAGNOSIS — G4730 Sleep apnea, unspecified: Secondary | ICD-10-CM | POA: Insufficient documentation

## 2023-04-14 DIAGNOSIS — I69398 Other sequelae of cerebral infarction: Secondary | ICD-10-CM | POA: Insufficient documentation

## 2023-04-14 DIAGNOSIS — Z79899 Other long term (current) drug therapy: Secondary | ICD-10-CM | POA: Diagnosis not present

## 2023-04-14 DIAGNOSIS — N186 End stage renal disease: Secondary | ICD-10-CM | POA: Insufficient documentation

## 2023-04-14 DIAGNOSIS — E876 Hypokalemia: Secondary | ICD-10-CM

## 2023-04-14 DIAGNOSIS — Z992 Dependence on renal dialysis: Secondary | ICD-10-CM | POA: Diagnosis not present

## 2023-04-14 DIAGNOSIS — E1149 Type 2 diabetes mellitus with other diabetic neurological complication: Secondary | ICD-10-CM

## 2023-04-14 HISTORY — PX: AV FISTULA PLACEMENT: SHX1204

## 2023-04-14 LAB — POCT I-STAT, CHEM 8
BUN: 46 mg/dL — ABNORMAL HIGH (ref 8–23)
Calcium, Ion: 1.05 mmol/L — ABNORMAL LOW (ref 1.15–1.40)
Chloride: 106 mmol/L (ref 98–111)
Creatinine, Ser: 4.8 mg/dL — ABNORMAL HIGH (ref 0.44–1.00)
Glucose, Bld: 99 mg/dL (ref 70–99)
HCT: 41 % (ref 36.0–46.0)
Hemoglobin: 13.9 g/dL (ref 12.0–15.0)
Potassium: 3.4 mmol/L — ABNORMAL LOW (ref 3.5–5.1)
Sodium: 139 mmol/L (ref 135–145)
TCO2: 21 mmol/L — ABNORMAL LOW (ref 22–32)

## 2023-04-14 LAB — GLUCOSE, CAPILLARY: Glucose-Capillary: 109 mg/dL — ABNORMAL HIGH (ref 70–99)

## 2023-04-14 SURGERY — ARTERIOVENOUS (AV) FISTULA CREATION
Anesthesia: General | Laterality: Left

## 2023-04-14 MED ORDER — VASOPRESSIN 20 UNIT/ML IV SOLN
INTRAVENOUS | Status: AC
  Filled 2023-04-14: qty 1

## 2023-04-14 MED ORDER — EPHEDRINE SULFATE-NACL 50-0.9 MG/10ML-% IV SOSY
PREFILLED_SYRINGE | INTRAVENOUS | Status: DC | PRN
  Administered 2023-04-14 (×4): 5 mg via INTRAVENOUS

## 2023-04-14 MED ORDER — PHENYLEPHRINE HCL (PRESSORS) 10 MG/ML IV SOLN
INTRAVENOUS | Status: DC | PRN
  Administered 2023-04-14 (×2): 80 ug via INTRAVENOUS
  Administered 2023-04-14: 160 ug via INTRAVENOUS

## 2023-04-14 MED ORDER — ROCURONIUM BROMIDE 10 MG/ML (PF) SYRINGE
PREFILLED_SYRINGE | INTRAVENOUS | Status: AC
  Filled 2023-04-14: qty 10

## 2023-04-14 MED ORDER — OXYCODONE HCL 5 MG PO TABS
5.0000 mg | ORAL_TABLET | Freq: Once | ORAL | Status: AC | PRN
  Administered 2023-04-14: 5 mg via ORAL

## 2023-04-14 MED ORDER — SUCCINYLCHOLINE CHLORIDE 200 MG/10ML IV SOSY
PREFILLED_SYRINGE | INTRAVENOUS | Status: AC
  Filled 2023-04-14: qty 10

## 2023-04-14 MED ORDER — ONDANSETRON HCL 4 MG/2ML IJ SOLN
INTRAMUSCULAR | Status: AC
  Filled 2023-04-14: qty 2

## 2023-04-14 MED ORDER — FENTANYL CITRATE (PF) 100 MCG/2ML IJ SOLN
INTRAMUSCULAR | Status: AC
  Filled 2023-04-14: qty 2

## 2023-04-14 MED ORDER — ACETAMINOPHEN 10 MG/ML IV SOLN
1000.0000 mg | Freq: Once | INTRAVENOUS | Status: DC | PRN
  Administered 2023-04-14: 1000 mg via INTRAVENOUS

## 2023-04-14 MED ORDER — LIDOCAINE HCL (PF) 2 % IJ SOLN
INTRAMUSCULAR | Status: AC
  Filled 2023-04-14: qty 5

## 2023-04-14 MED ORDER — OXYCODONE HCL 5 MG/5ML PO SOLN: 5.0000 mg | Freq: Once | ORAL | Status: AC | PRN

## 2023-04-14 MED ORDER — LACTATED RINGERS IV SOLN: INTRAVENOUS | Status: DC | PRN

## 2023-04-14 MED ORDER — PROPOFOL 10 MG/ML IV BOLUS
INTRAVENOUS | Status: DC | PRN
  Administered 2023-04-14: 100 mg via INTRAVENOUS

## 2023-04-14 MED ORDER — ACETAMINOPHEN 10 MG/ML IV SOLN
INTRAVENOUS | Status: AC
  Filled 2023-04-14: qty 100

## 2023-04-14 MED ORDER — ONDANSETRON HCL 4 MG/2ML IJ SOLN: 4.0000 mg | Freq: Once | INTRAMUSCULAR | Status: DC | PRN

## 2023-04-14 MED ORDER — MIDAZOLAM HCL 2 MG/2ML IJ SOLN
INTRAMUSCULAR | Status: AC
  Filled 2023-04-14: qty 2

## 2023-04-14 MED ORDER — DEXAMETHASONE SODIUM PHOSPHATE 10 MG/ML IJ SOLN
INTRAMUSCULAR | Status: DC | PRN
  Administered 2023-04-14: 10 mg via INTRAVENOUS

## 2023-04-14 MED ORDER — CHLORHEXIDINE GLUCONATE 0.12 % MT SOLN
OROMUCOSAL | Status: AC
  Filled 2023-04-14: qty 15

## 2023-04-14 MED ORDER — OXYCODONE HCL 5 MG PO TABS
ORAL_TABLET | ORAL | Status: AC
  Filled 2023-04-14: qty 1

## 2023-04-14 MED ORDER — HEPARIN SODIUM (PORCINE) 5000 UNIT/ML IJ SOLN
INTRAMUSCULAR | Status: AC
  Filled 2023-04-14: qty 1

## 2023-04-14 MED ORDER — SUGAMMADEX SODIUM 200 MG/2ML IV SOLN
INTRAVENOUS | Status: DC | PRN
  Administered 2023-04-14: 200 mg via INTRAVENOUS

## 2023-04-14 MED ORDER — LIDOCAINE HCL (CARDIAC) PF 100 MG/5ML IV SOSY
PREFILLED_SYRINGE | INTRAVENOUS | Status: DC | PRN
  Administered 2023-04-14: 40 mg via INTRAVENOUS

## 2023-04-14 MED ORDER — LACTATED RINGERS IV SOLN: INTRAVENOUS | Status: DC

## 2023-04-14 MED ORDER — VANCOMYCIN HCL IN DEXTROSE 1-5 GM/200ML-% IV SOLN
INTRAVENOUS | Status: AC
  Filled 2023-04-14: qty 200

## 2023-04-14 MED ORDER — DEXAMETHASONE SODIUM PHOSPHATE 10 MG/ML IJ SOLN
INTRAMUSCULAR | Status: AC
  Filled 2023-04-14: qty 1

## 2023-04-14 MED ORDER — EPINEPHRINE PF 1 MG/ML IJ SOLN
INTRAMUSCULAR | Status: AC
  Filled 2023-04-14: qty 1

## 2023-04-14 MED ORDER — FENTANYL CITRATE (PF) 100 MCG/2ML IJ SOLN
INTRAMUSCULAR | Status: DC | PRN
  Administered 2023-04-14: 25 ug via INTRAVENOUS

## 2023-04-14 MED ORDER — BUPIVACAINE HCL (PF) 0.5 % IJ SOLN
INTRAMUSCULAR | Status: AC
  Filled 2023-04-14: qty 30

## 2023-04-14 MED ORDER — PHENYLEPHRINE HCL-NACL 20-0.9 MG/250ML-% IV SOLN
INTRAVENOUS | Status: AC
  Filled 2023-04-14: qty 250

## 2023-04-14 MED ORDER — ONDANSETRON HCL 4 MG/2ML IJ SOLN
INTRAMUSCULAR | Status: DC | PRN
  Administered 2023-04-14: 4 mg via INTRAVENOUS

## 2023-04-14 MED ORDER — HEPARIN SODIUM (PORCINE) 1000 UNIT/ML IJ SOLN
INTRAMUSCULAR | Status: AC
  Filled 2023-04-14: qty 10

## 2023-04-14 MED ORDER — SODIUM CHLORIDE 0.9 % IV SOLN
INTRAVENOUS | Status: DC | PRN
  Administered 2023-04-14: 500 mL

## 2023-04-14 MED ORDER — ROCURONIUM BROMIDE 100 MG/10ML IV SOLN
INTRAVENOUS | Status: DC | PRN
  Administered 2023-04-14: 30 mg via INTRAVENOUS

## 2023-04-14 MED ORDER — VASOPRESSIN 20 UNIT/ML IV SOLN
INTRAVENOUS | Status: DC | PRN
  Administered 2023-04-14: 1 [IU] via INTRAVENOUS

## 2023-04-14 MED ORDER — PHENYLEPHRINE HCL-NACL 20-0.9 MG/250ML-% IV SOLN
INTRAVENOUS | Status: DC | PRN
  Administered 2023-04-14: 50 ug/min via INTRAVENOUS

## 2023-04-14 MED ORDER — HEPARIN SODIUM (PORCINE) 1000 UNIT/ML IJ SOLN
INTRAMUSCULAR | Status: DC | PRN
  Administered 2023-04-14: 3000 [IU] via INTRAVENOUS

## 2023-04-14 MED ORDER — MIDAZOLAM HCL 2 MG/2ML IJ SOLN
INTRAMUSCULAR | Status: DC | PRN
  Administered 2023-04-14: 2 mg via INTRAVENOUS

## 2023-04-14 MED ORDER — FENTANYL CITRATE (PF) 100 MCG/2ML IJ SOLN
25.0000 ug | INTRAMUSCULAR | Status: DC | PRN
  Administered 2023-04-14 (×2): 25 ug via INTRAVENOUS

## 2023-04-14 MED ORDER — HYDROCODONE-ACETAMINOPHEN 5-325 MG PO TABS: 2.0000 | ORAL_TABLET | Freq: Four times a day (QID) | ORAL | 0 refills | Status: AC | PRN

## 2023-04-14 SURGICAL SUPPLY — 52 items
ADH SKN CLS APL DERMABOND .7 (GAUZE/BANDAGES/DRESSINGS) ×1
APL PRP STRL LF DISP 70% ISPRP (MISCELLANEOUS) ×2
BAG DECANTER FOR FLEXI CONT (MISCELLANEOUS) ×1 IMPLANT
BLADE SURG SZ11 CARB STEEL (BLADE) ×1 IMPLANT
BOOT SUTURE VASCULAR YLW (MISCELLANEOUS) ×1
BRUSH SCRUB EZ 4% CHG (MISCELLANEOUS) ×1 IMPLANT
CHLORAPREP W/TINT 26 (MISCELLANEOUS) ×1 IMPLANT
CLAMP SUTURE YELLOW 5 PAIRS (MISCELLANEOUS) ×1 IMPLANT
CLIP SPRNG 6 S-JAW DBL (CLIP) ×1 IMPLANT
CLIP SPRNG 6MM S-JAW DBL (CLIP) ×1
DERMABOND ADVANCED .7 DNX12 (GAUZE/BANDAGES/DRESSINGS) ×1 IMPLANT
DRESSING SURGICEL FIBRLLR 1X2 (HEMOSTASIS) ×1 IMPLANT
DRSG SURGICEL FIBRILLAR 1X2 (HEMOSTASIS) ×1
ELECT CAUTERY BLADE 6.4 (BLADE) ×1 IMPLANT
ELECT REM PT RETURN 9FT ADLT (ELECTROSURGICAL) ×1
ELECTRODE REM PT RTRN 9FT ADLT (ELECTROSURGICAL) ×1 IMPLANT
GLOVE BIO SURGEON STRL SZ7 (GLOVE) ×2 IMPLANT
GOWN STRL REUS W/ TWL LRG LVL3 (GOWN DISPOSABLE) ×2 IMPLANT
GOWN STRL REUS W/ TWL XL LVL3 (GOWN DISPOSABLE) ×1 IMPLANT
GOWN STRL REUS W/TWL LRG LVL3 (GOWN DISPOSABLE) ×2
GOWN STRL REUS W/TWL XL LVL3 (GOWN DISPOSABLE) ×2
IV NS 500ML (IV SOLUTION) ×1
IV NS 500ML BAXH (IV SOLUTION) ×1 IMPLANT
KIT TURNOVER KIT A (KITS) ×1 IMPLANT
LABEL OR SOLS (LABEL) ×1 IMPLANT
LOOP VESSEL MAXI 1X406 RED (MISCELLANEOUS) ×1 IMPLANT
LOOP VESSEL MINI 0.8X406 BLUE (MISCELLANEOUS) ×1 IMPLANT
MANIFOLD NEPTUNE II (INSTRUMENTS) ×1 IMPLANT
NDL FILTER BLUNT 18X1 1/2 (NEEDLE) ×1 IMPLANT
NEEDLE FILTER BLUNT 18X1 1/2 (NEEDLE) ×1 IMPLANT
PACK EXTREMITY ARMC (MISCELLANEOUS) ×1 IMPLANT
PAD PREP OB/GYN DISP 24X41 (PERSONAL CARE ITEMS) ×1 IMPLANT
SOLUTION CELL SAVER (CLIP) ×1 IMPLANT
SPIKE FLUID TRANSFER (MISCELLANEOUS) ×1 IMPLANT
STOCKINETTE 48X4 2 PLY STRL (GAUZE/BANDAGES/DRESSINGS) ×1 IMPLANT
STOCKINETTE STRL 4IN 9604848 (GAUZE/BANDAGES/DRESSINGS) ×1 IMPLANT
SUT GORETEX 6.0 TT9 (SUTURE) ×1 IMPLANT
SUT MNCRL AB 4-0 PS2 18 (SUTURE) ×1 IMPLANT
SUT PROLENE 6 0 BV (SUTURE) ×4 IMPLANT
SUT SILK 0 SH 30 (SUTURE) ×1 IMPLANT
SUT SILK 2 0 (SUTURE) ×1
SUT SILK 2-0 18XBRD TIE 12 (SUTURE) ×1 IMPLANT
SUT SILK 3 0 (SUTURE) ×1
SUT SILK 3-0 18XBRD TIE 12 (SUTURE) ×1 IMPLANT
SUT SILK 4 0 (SUTURE) ×1
SUT SILK 4-0 18XBRD TIE 12 (SUTURE) ×1 IMPLANT
SUT VIC AB 3-0 SH 27 (SUTURE) ×1
SUT VIC AB 3-0 SH 27X BRD (SUTURE) ×1 IMPLANT
SYR 20ML LL LF (SYRINGE) ×1 IMPLANT
TAG SUTURE CLAMP YLW 5PR (MISCELLANEOUS) ×1
TRAP FLUID SMOKE EVACUATOR (MISCELLANEOUS) ×1 IMPLANT
WATER STERILE IRR 500ML POUR (IV SOLUTION) ×1 IMPLANT

## 2023-04-14 NOTE — Anesthesia Procedure Notes (Cosign Needed)
Procedure Name: Intubation Date/Time: 04/14/2023 11:16 AM  Performed by: Reed Breech, MDPre-anesthesia Checklist: Patient identified, Emergency Drugs available, Suction available and Patient being monitored Patient Re-evaluated:Patient Re-evaluated prior to induction Oxygen Delivery Method: Circle system utilized Preoxygenation: Pre-oxygenation with 100% oxygen Induction Type: IV induction Ventilation: Mask ventilation without difficulty Laryngoscope Size: McGraph and 3 Grade View: Grade I Tube type: Oral Tube size: 6.5 mm Number of attempts: 1 Airway Equipment and Method: Stylet and Video-laryngoscopy Placement Confirmation: ETT inserted through vocal cords under direct vision, positive ETCO2 and breath sounds checked- equal and bilateral Secured at: 21 cm Tube secured with: Tape Dental Injury: Teeth and Oropharynx as per pre-operative assessment

## 2023-04-14 NOTE — Interval H&P Note (Signed)
History and Physical Interval Note:  04/14/2023 10:02 AM  Anita Greene  has presented today for surgery, with the diagnosis of ESRD.  The various methods of treatment have been discussed with the patient and family. After consideration of risks, benefits and other options for treatment, the patient has consented to  Procedure(s): INSERTION OF ARTERIOVENOUS (AV) GORE-TEX GRAFT ARM (BRACHIAL AXILLARY) (Left) as a surgical intervention.  The patient's history has been reviewed, patient examined, no change in status, stable for surgery.  I have reviewed the patient's chart and labs.  Questions were answered to the patient's satisfaction.     Festus Barren

## 2023-04-14 NOTE — Op Note (Signed)
[facility]   OPERATIVE NOTE   PROCEDURE: Left brachiocephalic arteriovenous fistula placement  PRE-OPERATIVE DIAGNOSIS: 1.  ESRD        POST-OPERATIVE DIAGNOSIS: 1. ESRD       SURGEON: Festus Barren, MD  ASSISTANT(S): Rolla Plate, NP  ANESTHESIA: general  ESTIMATED BLOOD LOSS: 10 cc  FINDING(S): Adequate cephalic vein for fistula creation  SPECIMEN(S):  none  INDICATIONS:   Anita Greene is a 72 y.o. female who presents with renal failure in need of pemanent dialysis acces.  The patient is scheduled for left arm AV access placement. If adequate superficial vein is found, a fistula will be created. If not, a graft will be placed. The patient is aware the risks include but are not limited to: bleeding, infection, steal syndrome, nerve damage, ischemic monomelic neuropathy, failure to mature, and need for additional procedures.  The patient is aware of the risks of the procedure and elects to proceed forward. An assistant was present during the procedure to help facilitate the exposure and expedite the procedure.   DESCRIPTION: After full informed written consent was obtained from the patient, the patient was brought back to the operating room and placed supine upon the operating table.  Prior to induction, the patient received IV antibiotics. The assistant provided retraction and mobilization to help facilitate exposure and expedite the procedure throughout the entire procedure.  This included following suture, using retractors, and optimizing lighting.  After obtaining adequate anesthesia, the patient was then prepped and draped in the standard fashion for a left arm access procedure.  I made a curvilinear incision at the level of the antecubital fossa and dissected through the subcutaneous tissue and fascia to gain exposure of the brachial artery.  This was noted to be patent and adequate in size for fistula creation.  This was dissected out proximally and  distally and prepared for control with vessel loops.  I then dissected out the cephalic vein.  This was noted to be patent. She was a small woman, but the vein looked usable for fistula creation so I elected to pass dilators.  This vein easily accepted up to a 3.5 mm Garrett dilator without difficulty.  I then gave the patient 3000 units of intravenous heparin.  The vein was marked for orientation and the distal segment of the vein was ligated with a  2-0 silk, and the vein was transected.  I then instilled the heparinized saline into the vein and clamped it.  At this point, I reset my exposure of the brachial artery and pulled up control on the vessel loops.  I made an arteriotomy with a #11 blade, and then I extended the arteriotomy with a Potts scissor.  I injected heparinized saline proximal and distal to this arteriotomy.  The vein was then sewn to the artery in an end-to-side configuration with a running stitch of 6-0 Prolene.  Prior to completing this anastomosis, I allowed the vein and artery to backbleed.  There was no evidence of clot from any vessels.  I completed the anastomosis in the usual fashion and then released all vessel loops and clamps.  There was a palpable  thrill in the venous outflow, and there was a palpable pulse in the artery distal to the anastomosis.  At this point, I irrigated out the surgical wound.  Surgicel was placed. There was no further active bleeding.  The subcutaneous tissue was reapproximated with a running stitch of 3-0 Vicryl.  The skin was then  closed with a 4-0 Monocryl suture.  The skin was then cleaned, dried, and reinforced with Dermabond.  The patient tolerated this procedure well and was taken to the recovery room in stable condition  COMPLICATIONS: None  CONDITION: Stable   Festus Barren    04/14/2023, 12:24 PM  This note was created with Dragon Medical transcription system. Any errors in dictation are purely unintentional.

## 2023-04-14 NOTE — Transfer of Care (Cosign Needed)
Immediate Anesthesia Transfer of Care Note  Patient: Anita Greene  Procedure(s) Performed: ARTERIOVENOUS (AV) FISTULA CREATION (Left)  Patient Location: PACU  Anesthesia Type:General  Level of Consciousness: awake, alert , oriented, patient cooperative, and responds to stimulation  Airway & Oxygen Therapy: Patient Spontanous Breathing and Patient connected to face mask oxygen  Post-op Assessment: Report given to RN, Post -op Vital signs reviewed and stable, and Patient moving all extremities X 4  Post vital signs: Reviewed and stable  Last Vitals:  Vitals Value Taken Time  BP 133/54 04/14/23 1230  Temp    Pulse 68 04/14/23 1233  Resp 16 04/14/23 1233  SpO2 100 % 04/14/23 1233  Vitals shown include unvalidated device data.  Last Pain:  Vitals:   04/14/23 1019  PainSc: 4          Complications: There were no known notable events for this encounter.

## 2023-04-14 NOTE — Anesthesia Preprocedure Evaluation (Addendum)
Anesthesia Evaluation  Patient identified by MRN, date of birth, ID band Patient awake    Reviewed: Allergy & Precautions, NPO status , Patient's Chart, lab work & pertinent test results  History of Anesthesia Complications Negative for: history of anesthetic complications  Airway Mallampati: III   Neck ROM: Full    Dental  (+) Missing   Pulmonary sleep apnea    Pulmonary exam normal breath sounds clear to auscultation       Cardiovascular hypertension, Normal cardiovascular exam+ Valvular Problems/Murmurs (PFO)  Rhythm:Regular Rate:Normal  ECG 04/12/23: normal   Neuro/Psych  PSYCHIATRIC DISORDERS  Depression    Chronic lower back pain CVA (residual right-sided limp)    GI/Hepatic hiatal hernia,GERD  Medicated and Controlled,,  Endo/Other  diabetes, Type 2    Renal/GU ESRF and DialysisRenal disease (last HD 04/12/23)     Musculoskeletal  (+)  Fibromyalgia -  Abdominal   Peds  Hematology  (+) Blood dyscrasia, anemia   Anesthesia Other Findings   Reproductive/Obstetrics                             Anesthesia Physical Anesthesia Plan  ASA: 3  Anesthesia Plan: General   Post-op Pain Management:    Induction: Intravenous  PONV Risk Score and Plan: 3 and Ondansetron, Dexamethasone and Treatment may vary due to age or medical condition  Airway Management Planned: Oral ETT  Additional Equipment:   Intra-op Plan:   Post-operative Plan: Extubation in OR  Informed Consent: I have reviewed the patients History and Physical, chart, labs and discussed the procedure including the risks, benefits and alternatives for the proposed anesthesia with the patient or authorized representative who has indicated his/her understanding and acceptance.     Dental advisory given  Plan Discussed with: CRNA  Anesthesia Plan Comments: (Patient consented for risks of anesthesia including but not limited  to:  - adverse reactions to medications - damage to eyes, teeth, lips or other oral mucosa - nerve damage due to positioning  - sore throat or hoarseness - damage to heart, brain, nerves, lungs, other parts of body or loss of life  Informed patient about role of CRNA in peri- and intra-operative care.  Patient voiced understanding.)        Anesthesia Quick Evaluation

## 2023-04-14 NOTE — Discharge Instructions (Signed)

## 2023-04-14 NOTE — Progress Notes (Signed)
Patient awake/alert x4. LUE +bruit/thrill, +2 radial pulse. Tolerated water, crackers without event. Medicated as ordered.

## 2023-04-15 ENCOUNTER — Encounter: Payer: Self-pay | Admitting: Vascular Surgery

## 2023-04-19 ENCOUNTER — Encounter: Payer: Self-pay | Admitting: Vascular Surgery

## 2023-04-19 NOTE — Anesthesia Postprocedure Evaluation (Signed)
Anesthesia Post Note  Patient: Anita Greene  Procedure(s) Performed: ARTERIOVENOUS (AV) FISTULA CREATION (Left)  Patient location during evaluation: PACU Anesthesia Type: General Level of consciousness: awake and alert Pain management: pain level controlled Vital Signs Assessment: post-procedure vital signs reviewed and stable Respiratory status: spontaneous breathing, nonlabored ventilation, respiratory function stable and patient connected to nasal cannula oxygen Cardiovascular status: blood pressure returned to baseline and stable Postop Assessment: no apparent nausea or vomiting Anesthetic complications: no   There were no known notable events for this encounter.   Last Vitals:  Vitals:   04/14/23 1315 04/14/23 1342  BP: (!) 102/47 (!) 127/46  Pulse: 76 74  Resp: 13 14  Temp: (!) 36.4 C 37.1 C  SpO2: 97% 98%    Last Pain:  Vitals:   04/14/23 1342  TempSrc: Temporal  PainSc: 4                  Lenard Simmer

## 2023-04-25 DIAGNOSIS — N186 End stage renal disease: Secondary | ICD-10-CM | POA: Diagnosis not present

## 2023-04-25 DIAGNOSIS — Z992 Dependence on renal dialysis: Secondary | ICD-10-CM | POA: Diagnosis not present

## 2023-04-26 DIAGNOSIS — N186 End stage renal disease: Secondary | ICD-10-CM | POA: Diagnosis not present

## 2023-04-26 DIAGNOSIS — Z23 Encounter for immunization: Secondary | ICD-10-CM | POA: Diagnosis not present

## 2023-04-26 DIAGNOSIS — D509 Iron deficiency anemia, unspecified: Secondary | ICD-10-CM | POA: Diagnosis not present

## 2023-04-26 DIAGNOSIS — Z992 Dependence on renal dialysis: Secondary | ICD-10-CM | POA: Diagnosis not present

## 2023-04-27 DIAGNOSIS — F319 Bipolar disorder, unspecified: Secondary | ICD-10-CM | POA: Diagnosis not present

## 2023-04-27 DIAGNOSIS — Z6822 Body mass index (BMI) 22.0-22.9, adult: Secondary | ICD-10-CM | POA: Diagnosis not present

## 2023-04-27 DIAGNOSIS — Z992 Dependence on renal dialysis: Secondary | ICD-10-CM | POA: Diagnosis not present

## 2023-04-27 DIAGNOSIS — I10 Essential (primary) hypertension: Secondary | ICD-10-CM | POA: Diagnosis not present

## 2023-05-04 ENCOUNTER — Ambulatory Visit (INDEPENDENT_AMBULATORY_CARE_PROVIDER_SITE_OTHER): Payer: PPO | Admitting: Nurse Practitioner

## 2023-05-04 ENCOUNTER — Encounter (INDEPENDENT_AMBULATORY_CARE_PROVIDER_SITE_OTHER): Payer: Self-pay | Admitting: Nurse Practitioner

## 2023-05-04 ENCOUNTER — Ambulatory Visit (INDEPENDENT_AMBULATORY_CARE_PROVIDER_SITE_OTHER): Payer: PPO

## 2023-05-04 ENCOUNTER — Other Ambulatory Visit (INDEPENDENT_AMBULATORY_CARE_PROVIDER_SITE_OTHER): Payer: Self-pay | Admitting: Vascular Surgery

## 2023-05-04 VITALS — BP 117/71 | HR 70 | Resp 18 | Ht 60.0 in | Wt 122.0 lb

## 2023-05-04 DIAGNOSIS — N186 End stage renal disease: Secondary | ICD-10-CM | POA: Diagnosis not present

## 2023-05-04 DIAGNOSIS — Z9889 Other specified postprocedural states: Secondary | ICD-10-CM | POA: Diagnosis not present

## 2023-05-04 DIAGNOSIS — I10 Essential (primary) hypertension: Secondary | ICD-10-CM

## 2023-05-04 DIAGNOSIS — E1149 Type 2 diabetes mellitus with other diabetic neurological complication: Secondary | ICD-10-CM

## 2023-05-04 NOTE — H&P (View-Only) (Signed)
Subjective:    Patient ID: Anita Greene, female    DOB: [birth year], 72 y.o.   MRN: [id number] Chief Complaint  Patient presents with   Follow-up    6 week follow up with HDA    The patient returns to the office for followup of their dialysis access.  The access was placed on 04/14/2023  The access is not being used however there was concern during dialysis based on the bruit causing a sooner follow-up. The patient denies hand pain or other symptoms consistent with steal phenomena.  No significant arm swelling.  The patient denies any complaints from the dialysis center or their nephrologist.  The patient denies redness or swelling at the access site. The patient denies fever or chills at home or while on dialysis.  No recent shortening of the patient's walking distance or new symptoms consistent with claudication.  No history of rest pain symptoms. No new ulcers or wounds of the lower extremities have occurred.  The patient denies amaurosis fugax or recent TIA symptoms. There are no recent neurological changes noted. There is no history of DVT, PE or superficial thrombophlebitis. No recent episodes of angina or shortness of breath documented.   Duplex ultrasound of the AV access shows a patent access.  The patient has a flow volume of 914 but a noted retained valve within the mid upper arm with significant velocity increase.    Review of Systems  Skin:  Positive for wound.  All other systems reviewed and are negative.      Objective:   Physical Exam Vitals reviewed.  HENT:     Head: Normocephalic.  Cardiovascular:     Rate and Rhythm: Normal rate.     Pulses:          Radial pulses are 1+ on the left side.     Arteriovenous access: Left arteriovenous access is present.    Comments: Good thrill but whistling bruit Pulmonary:     Effort: Pulmonary effort is normal.  Skin:    General: Skin is warm and dry.  Neurological:     Mental Status: She is alert and oriented  to person, place, and time.  Psychiatric:        Mood and Affect: Mood normal.        Behavior: Behavior normal.        Thought Content: Thought content normal.        Judgment: Judgment normal.     BP 117/71 (BP Location: Right Arm)   Pulse 70   Resp 18   Ht 5' (1.524 m)   Wt 122 lb (55.3 kg)   BMI 23.83 kg/m   Past Medical History:  Diagnosis Date   Allergic rhinitis due to pollen    Arthritis    B12 deficiency    Cataract cortical, senile    Chronic kidney disease    Colon polyp    Depression    End stage chronic kidney disease (HCC)    Fibromyalgia    GERD (gastroesophageal reflux disease)    Hemiparesis affecting right side as late effect of cerebrovascular accident (CVA) (HCC)    Hyperlipidemia    Hypertension    IBS (irritable bowel syndrome)    diarrhea prone   Iron deficiency anemia    Irritable bowel syndrome with diarrhea    Macular degeneration of left eye    Migraine headache    Mood disorder (HCC)    Obstructive sleep apnea    Optic atrophy of  right eye    Osteoporosis    Paraesophageal hernia    PFO (patent foramen ovale)    Pneumonia    RLS (restless legs syndrome)    Secondary hyperparathyroidism, renal (HCC)    Sleep disturbance    Stroke (HCC)    Type 2 diabetes mellitus with neurological manifestations, controlled (HCC)     Social History   Socioeconomic History   Marital status: Widowed    Spouse name: Not on file   Number of children: 2   Years of education: Not on file   Highest education level: Not on file  Occupational History   Occupation: [employer]ages [employer]  Tobacco Use   Smoking status: Never   Smokeless tobacco: Never  Vaping Use   Vaping Use: Never used  Substance and Sexual Activity   Alcohol use: Never    Alcohol/week: 0.0 standard drinks of alcohol   Drug use: Never   Sexual activity: Not on file  Other Topics Concern   Not on file  Social History Narrative   1 son died    1 son living--2 grandchildren       Had living will--not sure about it   No formal health care POA--would want son   Would accept resuscitation   No prolonged tube feeds if cognitively unaware   Social Determinants of Health   Financial Resource Strain: Not on file  Food Insecurity: Not on file  Transportation Needs: Not on file  Physical Activity: Not on file  Stress: Not on file  Social Connections: Not on file  Intimate Partner Violence: Not on file    Past Surgical History:  Procedure Laterality Date   AV FISTULA PLACEMENT Left 04/14/2023   Procedure: ARTERIOVENOUS (AV) FISTULA CREATION;  Surgeon: Annice Needy, MD;  Location: [facility] ORS;  Service: Vascular;  Laterality: Left;   CATARACT EXTRACTION W/ INTRAOCULAR LENS  IMPLANT, BILATERAL     CHOLECYSTECTOMY     COLONOSCOPY     COLONOSCOPY WITH PROPOFOL N/A 04/04/2018   Procedure: COLONOSCOPY WITH PROPOFOL;  Surgeon: Christena Deem, MD;  Location: [facility] ENDOSCOPY;  Service: Endoscopy;  Laterality: N/A;   DIALYSIS/PERMA CATHETER INSERTION N/A 01/23/2022   Procedure: DIALYSIS/PERMA CATHETER INSERTION;  Surgeon: Annice Needy, MD;  Location: [facility] INVASIVE CV LAB;  Service: Cardiovascular;  Laterality: N/A;   DIALYSIS/PERMA CATHETER INSERTION N/A 01/28/2023   Procedure: DIALYSIS/PERMA CATHETER INSERTION;  Surgeon: Annice Needy, MD;  Location: [facility] INVASIVE CV LAB;  Service: Cardiovascular;  Laterality: N/A;   DIALYSIS/PERMA CATHETER REMOVAL N/A 06/15/2022   Procedure: DIALYSIS/PERMA CATHETER REMOVAL;  Surgeon: Annice Needy, MD;  Location: [facility] INVASIVE CV LAB;  Service: Cardiovascular;  Laterality: N/A;   ESOPHAGOGASTRODUODENOSCOPY (EGD) WITH PROPOFOL N/A 04/04/2018   Procedure: ESOPHAGOGASTRODUODENOSCOPY (EGD) WITH PROPOFOL;  Surgeon: Christena Deem, MD;  Location: [facility] ENDOSCOPY;  Service: Endoscopy;  Laterality: N/A;   HIATAL HERNIA REPAIR  1990   NISSEN FUNDOPLICATION  2021   PERITONEAL CATHETER INSERTION  04/09/2022   PERITONEAL CATHETER REMOVAL  02/2023    ROUX-EN-Y GASTRIC BYPASS  2021   TONSILLECTOMY AND ADENOIDECTOMY     TUBAL LIGATION     UPPER GASTROINTESTINAL ENDOSCOPY     Uroplasty  ~2000's   Dr Lonna Cobb    Family History  Problem Relation Age of Onset   Cancer Mother        lung cancer   Parkinson's disease Father    Dementia Father    COPD Father    Heart disease Father  Cancer Sister        lung cancer   Cancer Son        Ewing's sarcoma   Breast cancer Neg Hx     Allergies  Allergen Reactions   Cefditoren Pivoxil     Other reaction(s): Other (See Comments) GI issues, severe abdominal pain   Clarithromycin     Other reaction(s): Other (See Comments) GI issues   Codeine Sulfate Itching   Erythromycin Other (See Comments)    GI issues   Other Other (See Comments)    Oral iron   Pseudoephedrine Other (See Comments)    Altered mental status   Sulfa Antibiotics Itching       Latest Ref Rng & Units 04/14/2023   10:25 AM 04/12/2023    3:43 PM 01/12/2023   11:43 AM  CBC  WBC 4.0 - 10.5 K/uL  7.3  10.1   Hemoglobin 12.0 - 15.0 g/dL 40.9  81.1  91.4   Hematocrit 36.0 - 46.0 % 41.0  42.9  39.4   Platelets 150 - 400 K/uL  238  392       CMP     Component Value Date/Time   NA 139 04/14/2023 1025   K 3.4 (L) 04/14/2023 1025   CL 106 04/14/2023 1025   CO2 22 04/12/2023 1543   GLUCOSE 99 04/14/2023 1025   BUN 46 (H) 04/14/2023 1025   CREATININE 4.80 (H) 04/14/2023 1025   CALCIUM 8.3 (L) 04/12/2023 1543   PROT 7.0 01/21/2022 1036   ALBUMIN 3.4 (L) 01/12/2023 1143   AST 20 01/21/2022 1036   ALT 15 01/21/2022 1036   ALKPHOS 88 01/21/2022 1036   BILITOT 0.7 01/21/2022 1036   GFR 40.72 (L) 01/01/2017 1432   GFRNONAA 25 (L) 04/12/2023 1543     No results found.     Assessment & Plan:   1. ESRD (end stage renal disease) (HCC) Recommend:  The patient has a high-pitched whistle as the bruit which is abnormal.  There is a notable retained valve.  Patient should have a fistulagram with the intention  for intervention.  The intention for intervention is to restore appropriate flow and prevent thrombosis and possible loss of the access.  As well as improve the quality of dialysis therapy.  The risks, benefits and alternative therapies were reviewed in detail with the patient.  All questions were answered.  The patient agrees to proceed with angio/intervention.    The patient will follow up with me in the office after the procedure.   2. Primary hypertension Continue antihypertensive medications as already ordered, these medications have been reviewed and there are no changes at this time.  3. Type 2 diabetes mellitus with neurological manifestations, controlled (HCC) Continue hypoglycemic medications as already ordered, these medications have been reviewed and there are no changes at this time.  Hgb A1C to be monitored as already arranged by primary service   Current Outpatient Medications on File Prior to Visit  Medication Sig Dispense Refill   aspirin EC 81 MG tablet Take 81 mg by mouth at bedtime.     buPROPion (WELLBUTRIN XL) 150 MG 24 hr tablet Take 150 mg by mouth at bedtime.     busPIRone (BUSPAR) 15 MG tablet Take 15 mg by mouth 3 (three) times daily.     carvedilol (COREG) 6.25 MG tablet Take 6.25 mg by mouth 2 (two) times daily with a meal.     cetirizine (ZYRTEC) 10 MG tablet Take 1 tablet (10 mg  total) by mouth daily. (Patient taking differently: Take 10 mg by mouth at bedtime.) 90 tablet 3   cholecalciferol (VITAMIN D3) 10 MCG (400 UNIT) TABS tablet Take 2,000 Units by mouth daily.     diphenoxylate-atropine (LOMOTIL) 2.5-0.025 MG tablet TAKE 1 TABLET BY MOUTH 4 TIMES DAILY AS NEEDED FOR DIARRHEA OR LOOSE STOOLS. 90 tablet 0   DULoxetine (CYMBALTA) 30 MG capsule Take 1 capsule (30 mg total) by mouth daily. (Patient taking differently: Take 30 mg by mouth at bedtime.) 90 capsule 3   famotidine (PEPCID) 20 MG tablet Take 20 mg by mouth daily.     furosemide (LASIX) 40 MG  tablet Take 40 mg by mouth daily. [id number]     gabapentin (NEURONTIN) 100 MG capsule Take 100 mg by mouth at bedtime.     HYDROcodone-acetaminophen (NORCO/VICODIN) 5-325 MG tablet Take 2 tablets by mouth every 6 (six) hours as needed for moderate pain. 20 tablet 0   magnesium gluconate (MAGONATE) 500 MG tablet Take 500 mg by mouth daily.     midodrine (PROAMATINE) 10 MG tablet Take 10 mg by mouth 3 (three) times daily as needed.     Multiple Vitamins-Minerals (ICAPS) TABS Take 1 tablet by mouth daily.     multivitamin (RENA-VIT) TABS tablet Take 1 tablet by mouth at bedtime.     potassium chloride SA (KLOR-CON M) 20 MEQ tablet Take 2 tablets (40 mEq) today, then 1 tablet (20 mEq) BID tomorrow, then 1 tablet (20 mEq) on the day of surgery. Follow up with PCP/nephrology for repeat labs. 5 tablet 0   sennosides-docusate sodium (SENOKOT-S) 8.6-50 MG tablet Take 1 tablet by mouth daily as needed. Do not take this med within 2 hours of other meds     spironolactone (ALDACTONE) 25 MG tablet Take 25 mg by mouth daily.     verapamil (CALAN) 40 MG tablet Take 20 mg by mouth as directed. Tuesday, Wednesday, Thursday,Saturday,Sunday     vitamin B-12 (CYANOCOBALAMIN) 500 MCG tablet Take 500 mcg by mouth daily.     zolpidem (AMBIEN) 5 MG tablet Take 5 mg by mouth at bedtime as needed for sleep.     No current facility-administered medications on file prior to visit.    There are no Patient Instructions on file for this visit. No follow-ups on file.   Georgiana Spinner, NP

## 2023-05-04 NOTE — Progress Notes (Signed)
Subjective:    Patient ID: Anita Greene, female    DOB: [birth year], 72 y.o.   MRN: [id number] Chief Complaint  Patient presents with   Follow-up    6 week follow up with HDA    The patient returns to the office for followup of their dialysis access.  The access was placed on 04/14/2023  The access is not being used however there was concern during dialysis based on the bruit causing a sooner follow-up. The patient denies hand pain or other symptoms consistent with steal phenomena.  No significant arm swelling.  The patient denies any complaints from the dialysis center or their nephrologist.  The patient denies redness or swelling at the access site. The patient denies fever or chills at home or while on dialysis.  No recent shortening of the patient's walking distance or new symptoms consistent with claudication.  No history of rest pain symptoms. No new ulcers or wounds of the lower extremities have occurred.  The patient denies amaurosis fugax or recent TIA symptoms. There are no recent neurological changes noted. There is no history of DVT, PE or superficial thrombophlebitis. No recent episodes of angina or shortness of breath documented.   Duplex ultrasound of the AV access shows a patent access.  The patient has a flow volume of 914 but a noted retained valve within the mid upper arm with significant velocity increase.    Review of Systems  Skin:  Positive for wound.  All other systems reviewed and are negative.      Objective:   Physical Exam Vitals reviewed.  HENT:     Head: Normocephalic.  Cardiovascular:     Rate and Rhythm: Normal rate.     Pulses:          Radial pulses are 1+ on the left side.     Arteriovenous access: Left arteriovenous access is present.    Comments: Good thrill but whistling bruit Pulmonary:     Effort: Pulmonary effort is normal.  Skin:    General: Skin is warm and dry.  Neurological:     Mental Status: She is alert and oriented  to person, place, and time.  Psychiatric:        Mood and Affect: Mood normal.        Behavior: Behavior normal.        Thought Content: Thought content normal.        Judgment: Judgment normal.     BP 117/71 (BP Location: Right Arm)   Pulse 70   Resp 18   Ht 5' (1.524 m)   Wt 122 lb (55.3 kg)   BMI 23.83 kg/m   Past Medical History:  Diagnosis Date   Allergic rhinitis due to pollen    Arthritis    B12 deficiency    Cataract cortical, senile    Chronic kidney disease    Colon polyp    Depression    End stage chronic kidney disease (HCC)    Fibromyalgia    GERD (gastroesophageal reflux disease)    Hemiparesis affecting right side as late effect of cerebrovascular accident (CVA) (HCC)    Hyperlipidemia    Hypertension    IBS (irritable bowel syndrome)    diarrhea prone   Iron deficiency anemia    Irritable bowel syndrome with diarrhea    Macular degeneration of left eye    Migraine headache    Mood disorder (HCC)    Obstructive sleep apnea    Optic atrophy of  right eye    Osteoporosis    Paraesophageal hernia    PFO (patent foramen ovale)    Pneumonia    RLS (restless legs syndrome)    Secondary hyperparathyroidism, renal (HCC)    Sleep disturbance    Stroke (HCC)    Type 2 diabetes mellitus with neurological manifestations, controlled (HCC)     Social History   Socioeconomic History   Marital status: Widowed    Spouse name: Not on file   Number of children: 2   Years of education: Not on file   Highest education level: Not on file  Occupational History   Occupation: [employer]  Tobacco Use   Smoking status: Never   Smokeless tobacco: Never  Vaping Use   Vaping Use: Never used  Substance and Sexual Activity   Alcohol use: Never    Alcohol/week: 0.0 standard drinks of alcohol   Drug use: Never   Sexual activity: Not on file  Other Topics Concern   Not on file  Social History Narrative   1 son died    1 son living--2 grandchildren       Had living will--not sure about it   No formal health care POA--would want son   Would accept resuscitation   No prolonged tube feeds if cognitively unaware   Social Determinants of Health   Financial Resource Strain: Not on file  Food Insecurity: Not on file  Transportation Needs: Not on file  Physical Activity: Not on file  Stress: Not on file  Social Connections: Not on file  Intimate Partner Violence: Not on file    Past Surgical History:  Procedure Laterality Date   AV FISTULA PLACEMENT Left 04/14/2023   Procedure: ARTERIOVENOUS (AV) FISTULA CREATION;  Surgeon: Annice Needy, MD;  Location: [facility] ORS;  Service: Vascular;  Laterality: Left;   CATARACT EXTRACTION W/ INTRAOCULAR LENS  IMPLANT, BILATERAL     CHOLECYSTECTOMY     COLONOSCOPY     COLONOSCOPY WITH PROPOFOL N/A 04/04/2018   Procedure: COLONOSCOPY WITH PROPOFOL;  Surgeon: Christena Deem, MD;  Location: [facility] ENDOSCOPY;  Service: Endoscopy;  Laterality: N/A;   DIALYSIS/PERMA CATHETER INSERTION N/A 01/23/2022   Procedure: DIALYSIS/PERMA CATHETER INSERTION;  Surgeon: Annice Needy, MD;  Location: [facility] INVASIVE CV LAB;  Service: Cardiovascular;  Laterality: N/A;   DIALYSIS/PERMA CATHETER INSERTION N/A 01/28/2023   Procedure: DIALYSIS/PERMA CATHETER INSERTION;  Surgeon: Annice Needy, MD;  Location: [facility] INVASIVE CV LAB;  Service: Cardiovascular;  Laterality: N/A;   DIALYSIS/PERMA CATHETER REMOVAL N/A 06/15/2022   Procedure: DIALYSIS/PERMA CATHETER REMOVAL;  Surgeon: Annice Needy, MD;  Location: [facility] INVASIVE CV LAB;  Service: Cardiovascular;  Laterality: N/A;   ESOPHAGOGASTRODUODENOSCOPY (EGD) WITH PROPOFOL N/A 04/04/2018   Procedure: ESOPHAGOGASTRODUODENOSCOPY (EGD) WITH PROPOFOL;  Surgeon: Christena Deem, MD;  Location: [facility] ENDOSCOPY;  Service: Endoscopy;  Laterality: N/A;   HIATAL HERNIA REPAIR  1990   NISSEN FUNDOPLICATION  2021   PERITONEAL CATHETER INSERTION  04/09/2022   PERITONEAL CATHETER REMOVAL  02/2023    ROUX-EN-Y GASTRIC BYPASS  2021   TONSILLECTOMY AND ADENOIDECTOMY     TUBAL LIGATION     UPPER GASTROINTESTINAL ENDOSCOPY     Uroplasty  ~2000's   Dr Lonna Cobb    Family History  Problem Relation Age of Onset   Cancer Mother        lung cancer   Parkinson's disease Father    Dementia Father    COPD Father    Heart disease Father  Cancer Sister        lung cancer   Cancer Son        Ewing's sarcoma   Breast cancer Neg Hx     Allergies  Allergen Reactions   Cefditoren Pivoxil     Other reaction(s): Other (See Comments) GI issues, severe abdominal pain   Clarithromycin     Other reaction(s): Other (See Comments) GI issues   Codeine Sulfate Itching   Erythromycin Other (See Comments)    GI issues   Other Other (See Comments)    Oral iron   Pseudoephedrine Other (See Comments)    Altered mental status   Sulfa Antibiotics Itching       Latest Ref Rng & Units 04/14/2023   10:25 AM 04/12/2023    3:43 PM 01/12/2023   11:43 AM  CBC  WBC 4.0 - 10.5 K/uL  7.3  10.1   Hemoglobin 12.0 - 15.0 g/dL 40.9  81.1  91.4   Hematocrit 36.0 - 46.0 % 41.0  42.9  39.4   Platelets 150 - 400 K/uL  238  392       CMP     Component Value Date/Time   NA 139 04/14/2023 1025   K 3.4 (L) 04/14/2023 1025   CL 106 04/14/2023 1025   CO2 22 04/12/2023 1543   GLUCOSE 99 04/14/2023 1025   BUN 46 (H) 04/14/2023 1025   CREATININE 4.80 (H) 04/14/2023 1025   CALCIUM 8.3 (L) 04/12/2023 1543   PROT 7.0 01/21/2022 1036   ALBUMIN 3.4 (L) 01/12/2023 1143   AST 20 01/21/2022 1036   ALT 15 01/21/2022 1036   ALKPHOS 88 01/21/2022 1036   BILITOT 0.7 01/21/2022 1036   GFR 40.72 (L) 01/01/2017 1432   GFRNONAA 25 (L) 04/12/2023 1543     No results found.     Assessment & Plan:   1. ESRD (end stage renal disease) (HCC) Recommend:  The patient has a high-pitched whistle as the bruit which is abnormal.  There is a notable retained valve.  Patient should have a fistulagram with the intention  for intervention.  The intention for intervention is to restore appropriate flow and prevent thrombosis and possible loss of the access.  As well as improve the quality of dialysis therapy.  The risks, benefits and alternative therapies were reviewed in detail with the patient.  All questions were answered.  The patient agrees to proceed with angio/intervention.    The patient will follow up with me in the office after the procedure.   2. Primary hypertension Continue antihypertensive medications as already ordered, these medications have been reviewed and there are no changes at this time.  3. Type 2 diabetes mellitus with neurological manifestations, controlled (HCC) Continue hypoglycemic medications as already ordered, these medications have been reviewed and there are no changes at this time.  Hgb A1C to be monitored as already arranged by primary service   Current Outpatient Medications on File Prior to Visit  Medication Sig Dispense Refill   aspirin EC 81 MG tablet Take 81 mg by mouth at bedtime.     buPROPion (WELLBUTRIN XL) 150 MG 24 hr tablet Take 150 mg by mouth at bedtime.     busPIRone (BUSPAR) 15 MG tablet Take 15 mg by mouth 3 (three) times daily.     carvedilol (COREG) 6.25 MG tablet Take 6.25 mg by mouth 2 (two) times daily with a meal.     cetirizine (ZYRTEC) 10 MG tablet Take 1 tablet (10 mg  total) by mouth daily. (Patient taking differently: Take 10 mg by mouth at bedtime.) 90 tablet 3   cholecalciferol (VITAMIN D3) 10 MCG (400 UNIT) TABS tablet Take 2,000 Units by mouth daily.     diphenoxylate-atropine (LOMOTIL) 2.5-0.025 MG tablet TAKE 1 TABLET BY MOUTH 4 TIMES DAILY AS NEEDED FOR DIARRHEA OR LOOSE STOOLS. 90 tablet 0   DULoxetine (CYMBALTA) 30 MG capsule Take 1 capsule (30 mg total) by mouth daily. (Patient taking differently: Take 30 mg by mouth at bedtime.) 90 capsule 3   famotidine (PEPCID) 20 MG tablet Take 20 mg by mouth daily.     furosemide (LASIX) 40 MG  tablet Take 40 mg by mouth daily. [id number]     gabapentin (NEURONTIN) 100 MG capsule Take 100 mg by mouth at bedtime.     HYDROcodone-acetaminophen (NORCO/VICODIN) 5-325 MG tablet Take 2 tablets by mouth every 6 (six) hours as needed for moderate pain. 20 tablet 0   magnesium gluconate (MAGONATE) 500 MG tablet Take 500 mg by mouth daily.     midodrine (PROAMATINE) 10 MG tablet Take 10 mg by mouth 3 (three) times daily as needed.     Multiple Vitamins-Minerals (ICAPS) TABS Take 1 tablet by mouth daily.     multivitamin (RENA-VIT) TABS tablet Take 1 tablet by mouth at bedtime.     potassium chloride SA (KLOR-CON M) 20 MEQ tablet Take 2 tablets (40 mEq) today, then 1 tablet (20 mEq) BID tomorrow, then 1 tablet (20 mEq) on the day of surgery. Follow up with PCP/nephrology for repeat labs. 5 tablet 0   sennosides-docusate sodium (SENOKOT-S) 8.6-50 MG tablet Take 1 tablet by mouth daily as needed. Do not take this med within 2 hours of other meds     spironolactone (ALDACTONE) 25 MG tablet Take 25 mg by mouth daily.     verapamil (CALAN) 40 MG tablet Take 20 mg by mouth as directed. Tuesday, Wednesday, Thursday,Saturday,Sunday     vitamin B-12 (CYANOCOBALAMIN) 500 MCG tablet Take 500 mcg by mouth daily.     zolpidem (AMBIEN) 5 MG tablet Take 5 mg by mouth at bedtime as needed for sleep.     No current facility-administered medications on file prior to visit.    There are no Patient Instructions on file for this visit. No follow-ups on file.   Georgiana Spinner, NP

## 2023-05-26 ENCOUNTER — Encounter (INDEPENDENT_AMBULATORY_CARE_PROVIDER_SITE_OTHER): Payer: PPO

## 2023-05-26 ENCOUNTER — Ambulatory Visit (INDEPENDENT_AMBULATORY_CARE_PROVIDER_SITE_OTHER): Payer: PPO | Admitting: Nurse Practitioner

## 2023-05-26 DIAGNOSIS — N186 End stage renal disease: Secondary | ICD-10-CM | POA: Diagnosis not present

## 2023-05-26 DIAGNOSIS — Z992 Dependence on renal dialysis: Secondary | ICD-10-CM | POA: Diagnosis not present

## 2023-05-27 DIAGNOSIS — D631 Anemia in chronic kidney disease: Secondary | ICD-10-CM | POA: Diagnosis not present

## 2023-05-27 DIAGNOSIS — D509 Iron deficiency anemia, unspecified: Secondary | ICD-10-CM | POA: Diagnosis not present

## 2023-05-27 DIAGNOSIS — Z992 Dependence on renal dialysis: Secondary | ICD-10-CM | POA: Diagnosis not present

## 2023-05-27 DIAGNOSIS — N186 End stage renal disease: Secondary | ICD-10-CM | POA: Diagnosis not present

## 2023-05-28 ENCOUNTER — Telehealth (INDEPENDENT_AMBULATORY_CARE_PROVIDER_SITE_OTHER): Payer: Self-pay

## 2023-05-28 NOTE — Telephone Encounter (Addendum)
Spoke with the patient and she is scheduled with Dr. Wyn Quaker for a left arm fistulagram at the [facility] on 06/02/23 with a 1:30 pm arrival time. Pre-procedure instructions were discussed and will be sent to Mychart and mailed. Patient was offered 06/03/23 and declined.   Patient called back and wanted to be moved to 06/03/23 with a 9:30 am arrival time to the [facility].

## 2023-06-02 ENCOUNTER — Telehealth (INDEPENDENT_AMBULATORY_CARE_PROVIDER_SITE_OTHER): Payer: Self-pay

## 2023-06-02 NOTE — Telephone Encounter (Signed)
Spoke with the patient to attempt to reschedule her to another date per a request and the patient stated she could not be rescheduled.

## 2023-06-03 ENCOUNTER — Encounter
Admission: RE | Disposition: A | Discharge status: Home / Self Care | Payer: Self-pay | Source: Home / Self Care | Attending: Vascular Surgery

## 2023-06-03 ENCOUNTER — Ambulatory Visit
Admission: RE | Admit: 2023-06-03 | Discharge: 2023-06-03 | Disposition: A | Discharge status: Home / Self Care | Payer: PPO | Attending: Vascular Surgery | Admitting: Vascular Surgery

## 2023-06-03 ENCOUNTER — Encounter: Payer: Self-pay | Admitting: Certified Registered"

## 2023-06-03 ENCOUNTER — Encounter: Payer: Self-pay | Admitting: Vascular Surgery

## 2023-06-03 ENCOUNTER — Other Ambulatory Visit: Payer: Self-pay

## 2023-06-03 DIAGNOSIS — Y841 Kidney dialysis as the cause of abnormal reaction of the patient, or of later complication, without mention of misadventure at the time of the procedure: Secondary | ICD-10-CM | POA: Diagnosis not present

## 2023-06-03 DIAGNOSIS — N186 End stage renal disease: Secondary | ICD-10-CM | POA: Diagnosis not present

## 2023-06-03 DIAGNOSIS — E1122 Type 2 diabetes mellitus with diabetic chronic kidney disease: Secondary | ICD-10-CM | POA: Insufficient documentation

## 2023-06-03 DIAGNOSIS — T82858A Stenosis of vascular prosthetic devices, implants and grafts, initial encounter: Secondary | ICD-10-CM | POA: Insufficient documentation

## 2023-06-03 DIAGNOSIS — I120 Hypertensive chronic kidney disease with stage 5 chronic kidney disease or end stage renal disease: Secondary | ICD-10-CM | POA: Diagnosis not present

## 2023-06-03 DIAGNOSIS — Z992 Dependence on renal dialysis: Secondary | ICD-10-CM | POA: Insufficient documentation

## 2023-06-03 HISTORY — PX: A/V FISTULAGRAM: CATH118298

## 2023-06-03 LAB — POTASSIUM: Potassium: 4.6 mmol/L (ref 3.5–5.1)

## 2023-06-03 LAB — POTASSIUM (ARMC VASCULAR LAB ONLY): Potassium (ARMC vascular lab): 10.1 mmol/L (ref 3.5–5.1)

## 2023-06-03 SURGERY — A/V FISTULAGRAM
Anesthesia: Moderate Sedation | Laterality: Left

## 2023-06-03 MED ORDER — FENTANYL CITRATE (PF) 100 MCG/2ML IJ SOLN
INTRAMUSCULAR | Status: AC
  Filled 2023-06-03: qty 2

## 2023-06-03 MED ORDER — FENTANYL CITRATE (PF) 100 MCG/2ML IJ SOLN
INTRAMUSCULAR | Status: DC | PRN
  Administered 2023-06-03: 25 ug via INTRAVENOUS
  Administered 2023-06-03: 50 ug via INTRAVENOUS

## 2023-06-03 MED ORDER — MIDAZOLAM HCL 2 MG/2ML IJ SOLN
INTRAMUSCULAR | Status: DC | PRN
  Administered 2023-06-03 (×2): 1 mg via INTRAVENOUS

## 2023-06-03 MED ORDER — SODIUM CHLORIDE 0.9 % IV SOLN: INTRAVENOUS | Status: DC

## 2023-06-03 MED ORDER — HEPARIN (PORCINE) IN NACL 2000-0.9 UNIT/L-% IV SOLN
INTRAVENOUS | Status: DC | PRN
  Administered 2023-06-03: 1000 mL

## 2023-06-03 MED ORDER — DIPHENHYDRAMINE HCL 50 MG/ML IJ SOLN: 50.0000 mg | Freq: Once | INTRAMUSCULAR | Status: DC | PRN

## 2023-06-03 MED ORDER — IODIXANOL 320 MG/ML IV SOLN
INTRAVENOUS | Status: DC | PRN
  Administered 2023-06-03: 20 mL

## 2023-06-03 MED ORDER — VANCOMYCIN HCL IN DEXTROSE 1-5 GM/200ML-% IV SOLN
INTRAVENOUS | Status: AC
  Filled 2023-06-03: qty 200

## 2023-06-03 MED ORDER — HEPARIN SODIUM (PORCINE) 1000 UNIT/ML IJ SOLN
INTRAMUSCULAR | Status: AC
  Filled 2023-06-03: qty 10

## 2023-06-03 MED ORDER — MIDAZOLAM HCL 5 MG/5ML IJ SOLN
INTRAMUSCULAR | Status: AC
  Filled 2023-06-03: qty 5

## 2023-06-03 MED ORDER — HYDROMORPHONE HCL 1 MG/ML IJ SOLN: 1.0000 mg | Freq: Once | INTRAMUSCULAR | Status: DC | PRN

## 2023-06-03 MED ORDER — FAMOTIDINE 20 MG PO TABS: 40.0000 mg | ORAL_TABLET | Freq: Once | ORAL | Status: DC | PRN

## 2023-06-03 MED ORDER — ONDANSETRON HCL 4 MG/2ML IJ SOLN: 4.0000 mg | Freq: Four times a day (QID) | INTRAMUSCULAR | Status: DC | PRN

## 2023-06-03 MED ORDER — METHYLPREDNISOLONE SODIUM SUCC 125 MG IJ SOLR: 125.0000 mg | Freq: Once | INTRAMUSCULAR | Status: DC | PRN

## 2023-06-03 MED ORDER — MIDAZOLAM HCL 2 MG/ML PO SYRP: 8.0000 mg | ORAL_SOLUTION | Freq: Once | ORAL | Status: DC | PRN

## 2023-06-03 MED ORDER — MIDAZOLAM HCL 2 MG/2ML IJ SOLN
INTRAMUSCULAR | Status: AC
  Filled 2023-06-03: qty 4

## 2023-06-03 MED ORDER — VANCOMYCIN HCL IN DEXTROSE 1-5 GM/200ML-% IV SOLN
1000.0000 mg | INTRAVENOUS | Status: AC
  Administered 2023-06-03: 1000 mg via INTRAVENOUS

## 2023-06-03 MED ORDER — HEPARIN SODIUM (PORCINE) 1000 UNIT/ML IJ SOLN
INTRAMUSCULAR | Status: DC | PRN
  Administered 2023-06-03: 3000 [IU] via INTRAVENOUS

## 2023-06-03 SURGICAL SUPPLY — 11 items
BALLN LUTONIX DCB 5X80X130 (BALLOONS) ×1
BALLOON LUTONIX DCB 5X80X130 (BALLOONS) IMPLANT
CANNULA 5F STIFF (CANNULA) IMPLANT
CATH BEACON 5 .035 65 KMP TIP (CATHETERS) IMPLANT
COVER EZ STRL 42X30 (DRAPES) IMPLANT
COVER PROBE ULTRASOUND 5X96 (MISCELLANEOUS) IMPLANT
GLIDEWIRE ADV .035X180CM (WIRE) IMPLANT
KIT ENCORE 26 ADVANTAGE (KITS) IMPLANT
PACK ANGIOGRAPHY (CUSTOM PROCEDURE TRAY) IMPLANT
SHEATH BRITE TIP 6FRX5.5 (SHEATH) IMPLANT
SUT MNCRL AB 4-0 PS2 18 (SUTURE) IMPLANT

## 2023-06-03 NOTE — Op Note (Signed)
[facility]    OPERATIVE NOTE   PROCEDURE: 1.   Left brachiocephalic arteriovenous fistula cannulation under ultrasound guidance 2.   Left arm fistulagram including central venogram 3.   Percutaneous transluminal angioplasty of perianastomotic cephalic vein stenosis with 5 mm diameter by 8 cm length Lutonix drug-coated angioplasty balloon  PRE-OPERATIVE DIAGNOSIS: 1. ESRD 2. Poorly functional left brachiocephalic AVF  POST-OPERATIVE DIAGNOSIS: same as above   SURGEON: Festus Barren, MD  ANESTHESIA: local with MCS  ESTIMATED BLOOD LOSS: 5 cc  FINDING(S): 2 areas of narrowing within 4 to 5 cm of the brachiocephalic anastomosis within the cephalic vein, both of which in the 70 to 80% range.  The remainder of the fistula was then patent.  SPECIMEN(S):  None  CONTRAST: 20 cc  FLUORO TIME: 1.4 minutes  MODERATE CONSCIOUS SEDATION TIME: Approximately 24 minutes with 2 mg of Versed and 75 mcg of Fentanyl   INDICATIONS: Anita Greene is a 72 y.o. female who presents with malfunctioning left brachiocephalic arteriovenous fistula.  The patient is scheduled for left arm fistulagram.  The patient is aware the risks include but are not limited to: bleeding, infection, thrombosis of the cannulated access, and possible anaphylactic reaction to the contrast.  The patient is aware of the risks of the procedure and elects to proceed forward.  DESCRIPTION: After full informed written consent was obtained, the patient was brought back to the angiography suite and placed supine upon the angiography table.  The patient was connected to monitoring equipment. Moderate conscious sedation was administered with a face to face encounter with the patient throughout the procedure with my supervision of the RN administering medicines and monitoring the patient's vital signs and mental status throughout from the start of the procedure until the patient was taken to the recovery room. The  left arm was prepped and draped in the standard fashion for a percutaneous access intervention.  Under ultrasound guidance, the left brachiocephalic arteriovenous fistula was cannulated with a micropuncture needle under direct ultrasound guidance in a retrograde fashion in the proximal to mid upper arm cephalic vein where it was patent and a permanent image was performed.  The microwire was advanced into the fistula and the needle was exchanged for the a microsheath.  I then upsized to a 6 Fr Sheath and imaging was performed.  Hand injections were completed to image the access including the central venous system. This demonstrated 2 areas of narrowing within 4 to 5 cm of the brachiocephalic anastomosis within the cephalic vein, both of which in the 70 to 80% range.  The remainder of the fistula was then patent.  Based on the images, this patient will need intervention to the perianastomotic cephalic vein stenoses. I then gave the patient 3000 units of intravenous heparin.  I then crossed the stenosis with an Advantage wire.  Based on the imaging, a 5 mm x 8 cm Lutonix drug-coated angioplasty balloon was selected.  The balloon was centered around the perianastomotic cephalic vein stenosis and inflated to 12 ATM for 1 minute(s).  On completion imaging, a 20-30% residual stenosis was present in both areas.     Based on the completion imaging, no further intervention is necessary.  The wire and balloon were removed from the sheath.  A 4-0 Monocryl purse-string suture was sewn around the sheath.  The sheath was removed while tying down the suture.  A sterile bandage was applied to the puncture site.  COMPLICATIONS: None  CONDITION: Stable  Festus Barren  06/03/2023 2:20 PM   This note was created with Dragon Medical transcription system. Any errors in dictation are purely unintentional.

## 2023-06-03 NOTE — Interval H&P Note (Signed)
History and Physical Interval Note:  06/03/2023 10:12 AM  Anita Greene  has presented today for surgery, with the diagnosis of L arm fistulagram   End Stage Renal.  The various methods of treatment have been discussed with the patient and family. After consideration of risks, benefits and other options for treatment, the patient has consented to  Procedure(s): A/V Fistulagram (Left) as a surgical intervention.  The patient's history has been reviewed, patient examined, no change in status, stable for surgery.  I have reviewed the patient's chart and labs.  Questions were answered to the patient's satisfaction.     Festus Barren

## 2023-06-04 ENCOUNTER — Encounter: Payer: Self-pay | Admitting: Vascular Surgery

## 2023-06-04 MED ORDER — LIDOCAINE-EPINEPHRINE (PF) 1 %-1:200000 IJ SOLN
INTRAMUSCULAR | Status: AC | PRN
  Administered 2023-06-03: 10 mL

## 2023-06-25 ENCOUNTER — Telehealth (INDEPENDENT_AMBULATORY_CARE_PROVIDER_SITE_OTHER): Payer: Self-pay

## 2023-06-25 NOTE — Telephone Encounter (Signed)
Spoke with the patient and she is scheduled with Dr. Gilda Crease for a permcath removal on 07/06/23 with a 2:00 pm arrival time to the [facility]. Pre-procedure instructions were discussed and will be sent to Mychart and mailed.

## 2023-06-25 NOTE — Telephone Encounter (Signed)
I attempted to contact the patient to schedule a permcath removal. A message was left for a return call. 

## 2023-06-26 DIAGNOSIS — Z992 Dependence on renal dialysis: Secondary | ICD-10-CM | POA: Diagnosis not present

## 2023-06-26 DIAGNOSIS — N186 End stage renal disease: Secondary | ICD-10-CM | POA: Diagnosis not present

## 2023-06-27 DIAGNOSIS — Z992 Dependence on renal dialysis: Secondary | ICD-10-CM | POA: Diagnosis not present

## 2023-06-27 DIAGNOSIS — N186 End stage renal disease: Secondary | ICD-10-CM | POA: Diagnosis not present

## 2023-06-27 DIAGNOSIS — D509 Iron deficiency anemia, unspecified: Secondary | ICD-10-CM | POA: Diagnosis not present

## 2023-06-27 DIAGNOSIS — D631 Anemia in chronic kidney disease: Secondary | ICD-10-CM | POA: Diagnosis not present

## 2023-07-06 ENCOUNTER — Encounter
Admission: RE | Disposition: A | Discharge status: Home / Self Care | Payer: Self-pay | Source: Home / Self Care | Attending: Vascular Surgery

## 2023-07-06 ENCOUNTER — Encounter: Payer: Self-pay | Admitting: Vascular Surgery

## 2023-07-06 ENCOUNTER — Ambulatory Visit
Admission: RE | Admit: 2023-07-06 | Discharge: 2023-07-06 | Disposition: A | Discharge status: Home / Self Care | Payer: PPO | Attending: Vascular Surgery | Admitting: Vascular Surgery

## 2023-07-06 DIAGNOSIS — I120 Hypertensive chronic kidney disease with stage 5 chronic kidney disease or end stage renal disease: Secondary | ICD-10-CM | POA: Insufficient documentation

## 2023-07-06 DIAGNOSIS — E1122 Type 2 diabetes mellitus with diabetic chronic kidney disease: Secondary | ICD-10-CM | POA: Insufficient documentation

## 2023-07-06 DIAGNOSIS — Z452 Encounter for adjustment and management of vascular access device: Secondary | ICD-10-CM

## 2023-07-06 DIAGNOSIS — N186 End stage renal disease: Secondary | ICD-10-CM | POA: Diagnosis not present

## 2023-07-06 DIAGNOSIS — Z992 Dependence on renal dialysis: Secondary | ICD-10-CM

## 2023-07-06 DIAGNOSIS — Z95828 Presence of other vascular implants and grafts: Secondary | ICD-10-CM | POA: Diagnosis not present

## 2023-07-06 DIAGNOSIS — Z4901 Encounter for fitting and adjustment of extracorporeal dialysis catheter: Secondary | ICD-10-CM | POA: Insufficient documentation

## 2023-07-06 HISTORY — PX: DIALYSIS/PERMA CATHETER REMOVAL: CATH118289

## 2023-07-06 SURGERY — DIALYSIS/PERMA CATHETER REMOVAL
Anesthesia: LOCAL

## 2023-07-06 MED ORDER — LIDOCAINE HCL (PF) 1 % IJ SOLN
INTRAMUSCULAR | Status: DC | PRN
  Administered 2023-07-06: 5 mL

## 2023-07-06 SURGICAL SUPPLY — 3 items
FORCEPS HALSTEAD CVD 5IN STRL (INSTRUMENTS) IMPLANT
SUT MNCRL AB 4-0 PS2 18 (SUTURE) IMPLANT
TRAY LACERAT/PLASTIC (MISCELLANEOUS) IMPLANT

## 2023-07-06 NOTE — Interval H&P Note (Signed)
History and Physical Interval Note:  07/06/2023 4:19 PM  Anita Greene  has presented today for surgery, with the diagnosis of Perma Cath Removal   End Stage Renal.  The various methods of treatment have been discussed with the patient and family. After consideration of risks, benefits and other options for treatment, the patient has consented to  Procedure(s): DIALYSIS/PERMA CATHETER REMOVAL (N/A) as a surgical intervention.  The patient's history has been reviewed, patient examined, no change in status, stable for surgery.  I have reviewed the patient's chart and labs.  Questions were answered to the patient's satisfaction.     Levora Dredge

## 2023-07-06 NOTE — Interval H&P Note (Signed)
History and Physical Interval Note:  07/06/2023 4:18 PM  Anita Greene  has presented today for surgery, with the diagnosis of Perma Cath Removal   End Stage Renal.  The various methods of treatment have been discussed with the patient and family. After consideration of risks, benefits and other options for treatment, the patient has consented to  Procedure(s): DIALYSIS/PERMA CATHETER REMOVAL (N/A) as a surgical intervention.  The patient's history has been reviewed, patient examined, no change in status, stable for surgery.  I have reviewed the patient's chart and labs.  Questions were answered to the patient's satisfaction.     Levora Dredge

## 2023-07-06 NOTE — H&P (Signed)
MRN : [id number]  Anita Greene is a 72 y.o. (11-Sep-1951) female who presents with chief complaint of check access.  History of Present Illness:   The patient presents to [facility] at the request of her nephrologist for removal of her dialysis catheter.  She has a left arm fistula which they have been using.  The patient reports the function of the access has been stable. Patient denies difficulty with cannulation. The patient denies increased bleeding time after removing the needles. The patient denies hand pain or other symptoms consistent with steal phenomena.  No significant arm swelling.  The patient denies any complaints from the dialysis center or their nephrologist.  The patient denies redness or swelling at the access site. The patient denies fever or chills at home or while on dialysis.  No recent shortening of the patient's walking distance or new symptoms consistent with claudication.  No history of rest pain symptoms. No new ulcers or wounds of the lower extremities have occurred.  The patient denies amaurosis fugax or recent TIA symptoms. There are no recent neurological changes noted. There is no history of DVT, PE or superficial thrombophlebitis. No recent episodes of angina or shortness of breath documented.      Current Meds  Medication Sig   aspirin EC 81 MG tablet Take 81 mg by mouth at bedtime.   buPROPion (WELLBUTRIN XL) 150 MG 24 hr tablet Take 150 mg by mouth at bedtime.   busPIRone (BUSPAR) 15 MG tablet Take 15 mg by mouth 3 (three) times daily.   carvedilol (COREG) 6.25 MG tablet Take 6.25 mg by mouth 2 (two) times daily with a meal.   cetirizine (ZYRTEC) 10 MG tablet Take 1 tablet (10 mg total) by mouth daily. (Patient taking differently: Take 10 mg by mouth at bedtime.)   cholecalciferol (VITAMIN D3) 10 MCG (400 UNIT) TABS tablet Take 2,000 Units by mouth daily.   DULoxetine (CYMBALTA) 30 MG capsule Take 1  capsule (30 mg total) by mouth daily. (Patient taking differently: Take 30 mg by mouth at bedtime.)   famotidine (PEPCID) 20 MG tablet Take 20 mg by mouth daily.   furosemide (LASIX) 40 MG tablet Take 40 mg by mouth daily. T-[id number]-SAT-SUN   gabapentin (NEURONTIN) 100 MG capsule Take 100 mg by mouth at bedtime.   magnesium gluconate (MAGONATE) 500 MG tablet Take 500 mg by mouth daily.   midodrine (PROAMATINE) 10 MG tablet Take 10 mg by mouth 3 (three) times daily as needed.   Multiple Vitamins-Minerals (ICAPS) TABS Take 1 tablet by mouth daily.   multivitamin (RENA-VIT) TABS tablet Take 1 tablet by mouth at bedtime.   spironolactone (ALDACTONE) 25 MG tablet Take 25 mg by mouth daily.   verapamil (CALAN) 40 MG tablet Take 20 mg by mouth as directed. Tuesday, Wednesday, Thursday,Saturday,Sunday   vitamin B-12 (CYANOCOBALAMIN) 500 MCG tablet Take 500 mcg by mouth daily.   zolpidem (AMBIEN) 5 MG tablet Take 5 mg by mouth at bedtime as needed for sleep.    Past Medical History:  Diagnosis Date   Allergic rhinitis due to pollen    Arthritis    B12 deficiency    Cataract cortical, senile    Chronic kidney disease    Colon polyp    Depression    End stage chronic kidney disease (HCC)    Fibromyalgia    GERD (gastroesophageal  reflux disease)    Hemiparesis affecting right side as late effect of cerebrovascular accident (CVA) (HCC)    Hyperlipidemia    Hypertension    IBS (irritable bowel syndrome)    diarrhea prone   Iron deficiency anemia    Irritable bowel syndrome with diarrhea    Macular degeneration of left eye    Migraine headache    Mood disorder (HCC)    Obstructive sleep apnea    Optic atrophy of right eye    Osteoporosis    Paraesophageal hernia    PFO (patent foramen ovale)    Pneumonia    RLS (restless legs syndrome)    Secondary hyperparathyroidism, renal (HCC)    Sleep disturbance    Stroke (HCC)    Type 2 diabetes mellitus with neurological manifestations, controlled  [facility])     Past Surgical History:  Procedure Laterality Date   A/V FISTULAGRAM Left 06/03/2023   Procedure: A/V Fistulagram;  Surgeon: Annice Needy, MD;  Location: [facility] INVASIVE CV LAB;  Service: Cardiovascular;  Laterality: Left;   AV FISTULA PLACEMENT Left 04/14/2023   Procedure: ARTERIOVENOUS (AV) FISTULA CREATION;  Surgeon: Annice Needy, MD;  Location: [facility] ORS;  Service: Vascular;  Laterality: Left;   CATARACT EXTRACTION W/ INTRAOCULAR LENS  IMPLANT, BILATERAL     CHOLECYSTECTOMY     COLONOSCOPY     COLONOSCOPY WITH PROPOFOL N/A 04/04/2018   Procedure: COLONOSCOPY WITH PROPOFOL;  Surgeon: Christena Deem, MD;  Location: [facility] ENDOSCOPY;  Service: Endoscopy;  Laterality: N/A;   DIALYSIS/PERMA CATHETER INSERTION N/A 01/23/2022   Procedure: DIALYSIS/PERMA CATHETER INSERTION;  Surgeon: Annice Needy, MD;  Location: [facility] INVASIVE CV LAB;  Service: Cardiovascular;  Laterality: N/A;   DIALYSIS/PERMA CATHETER INSERTION N/A 01/28/2023   Procedure: DIALYSIS/PERMA CATHETER INSERTION;  Surgeon: Annice Needy, MD;  Location: [facility] INVASIVE CV LAB;  Service: Cardiovascular;  Laterality: N/A;   DIALYSIS/PERMA CATHETER REMOVAL N/A 06/15/2022   Procedure: DIALYSIS/PERMA CATHETER REMOVAL;  Surgeon: Annice Needy, MD;  Location: [facility] INVASIVE CV LAB;  Service: Cardiovascular;  Laterality: N/A;   ESOPHAGOGASTRODUODENOSCOPY (EGD) WITH PROPOFOL N/A 04/04/2018   Procedure: ESOPHAGOGASTRODUODENOSCOPY (EGD) WITH PROPOFOL;  Surgeon: Christena Deem, MD;  Location: [facility] ENDOSCOPY;  Service: Endoscopy;  Laterality: N/A;   HIATAL HERNIA REPAIR  1990   NISSEN FUNDOPLICATION  2021   PERITONEAL CATHETER INSERTION  04/09/2022   PERITONEAL CATHETER REMOVAL  02/2023   ROUX-EN-Y GASTRIC BYPASS  2021   TONSILLECTOMY AND ADENOIDECTOMY     TUBAL LIGATION     UPPER GASTROINTESTINAL ENDOSCOPY     Uroplasty  ~2000's   Dr Lonna Cobb    Social History Social History   Tobacco Use   Smoking status: Never   Smokeless  tobacco: Never  Vaping Use   Vaping status: Never Used  Substance Use Topics   Alcohol use: Never    Alcohol/week: 0.0 standard drinks of alcohol   Drug use: Never    Family History Family History  Problem Relation Age of Onset   Cancer Mother        lung cancer   Parkinson's disease Father    Dementia Father    COPD Father    Heart disease Father    Cancer Sister        lung cancer   Cancer Son        Ewing's sarcoma   Breast cancer Neg Hx     Allergies  Allergen Reactions   Cefditoren Pivoxil     Other reaction(s):  Other (See Comments) GI issues, severe abdominal pain   Clarithromycin     Other reaction(s): Other (See Comments) GI issues   Codeine Sulfate Itching   Erythromycin Other (See Comments)    GI issues   Other Other (See Comments)    Oral iron   Pseudoephedrine Other (See Comments)    Altered mental status   Sulfa Antibiotics Itching     REVIEW OF SYSTEMS (Negative unless checked)  Constitutional: [] Weight loss  [] Fever  [] Chills Cardiac: [] Chest pain   [] Chest pressure   [] Palpitations   [] Shortness of breath when laying flat   [] Shortness of breath with exertion. Vascular:  [] Pain in legs with walking   [] Pain in legs at rest  [] History of DVT   [] Phlebitis   [] Swelling in legs   [] Varicose veins   [] Non-healing ulcers Pulmonary:   [] Uses home oxygen   [] Productive cough   [] Hemoptysis   [] Wheeze  [] COPD   [] Asthma Neurologic:  [] Dizziness   [] Seizures   [] History of stroke   [] History of TIA  [] Aphasia   [] Vissual changes   [] Weakness or numbness in arm   [] Weakness or numbness in leg Musculoskeletal:   [] Joint swelling   [] Joint pain   [] Low back pain Hematologic:  [] Easy bruising  [] Easy bleeding   [] Hypercoagulable state   [] Anemic Gastrointestinal:  [] Diarrhea   [] Vomiting  [] Gastroesophageal reflux/heartburn   [] Difficulty swallowing. Genitourinary:  [x] Chronic kidney disease   [] Difficult urination  [] Frequent urination   [] Blood in  urine Skin:  [] Rashes   [] Ulcers  Psychological:  [] History of anxiety   []  History of major depression.  Physical Examination  Vitals:   07/06/23 1421 07/06/23 1448 07/06/23 1549 07/06/23 1600  BP: (!) 148/82  (!) 125/47 122/65  Pulse: 87   72  Resp: 16  18 20   Temp: 98.4 F (36.9 C)     TempSrc: Oral     SpO2: 97%  97% 98%  Weight:  54.4 kg    Height:  5' (1.524 m)     Body mass index is 23.44 kg/m. Gen: WD/WN, NAD Head: [place]/AT, No temporalis wasting.  Ear/Nose/Throat: Hearing grossly intact, nares w/o erythema or drainage Eyes: PER, EOMI, sclera nonicteric.  Neck: Supple, no gross masses or lesions.  No JVD.  Pulmonary:  Good air movement, no audible wheezing, no use of accessory muscles.  Cardiac: RRR, precordium non-hyperdynamic. Vascular:   Right IJ catheter clean dry and intact; left arm fistula good thrill good bruit Vessel Right Left  Radial Palpable Palpable  Brachial Palpable Palpable  Gastrointestinal: soft, non-distended. No guarding/no peritoneal signs.  Musculoskeletal: M/S 5/5 throughout.  No deformity.  Neurologic: CN 2-12 intact. Pain and light touch intact in extremities.  Symmetrical.  Speech is fluent. Motor exam as listed above. Psychiatric: Judgment intact, Mood & affect appropriate for pt's clinical situation. Dermatologic: No rashes or ulcers noted.  No changes consistent with cellulitis.   CBC Lab Results  Component Value Date   WBC 7.3 04/12/2023   HGB 13.9 04/14/2023   HCT 41.0 04/14/2023   MCV 95.3 04/12/2023   PLT 238 04/12/2023    BMET    Component Value Date/Time   NA 139 04/14/2023 1025   K 4.6 06/03/2023 1237   CL 106 04/14/2023 1025   CO2 22 04/12/2023 1543   GLUCOSE 99 04/14/2023 1025   BUN 46 (H) 04/14/2023 1025   CREATININE 4.80 (H) 04/14/2023 1025   CALCIUM 8.3 (L) 04/12/2023 1543   GFRNONAA 25 (L)  04/12/2023 1543   CrCl cannot be calculated (Patient's most recent lab result is older than the maximum 21 days  allowed.).  COAG Lab Results  Component Value Date   INR 1.0 08/11/2021    Radiology PERIPHERAL VASCULAR CATHETERIZATION  Result Date: 07/06/2023 See surgical note for result.    Assessment/Plan End-stage renal disease requiring hemodialysis: Recommend:  The patient is doing well and currently has adequate dialysis access. The patient's dialysis center is not reporting any access issues.  At this point the catheter is no longer necessary it is not being used and represents a source of sepsis and therefore should be removed.  Risks and benefits were reviewed all questions were answered patient agrees to proceed.  The patient should have a duplex ultrasound of the dialysis access in 6 months. The patient will follow-up with me in the office after each ultrasound   2.  Hypertension:  An underlying cause of renal failure and blood pressure control important in reducing the progression of atherosclerotic disease. On appropriate oral medications.   3.  Diabetes:  An underlying cause of her renal failure and blood glucose control important in reducing the progression of atherosclerotic disease. Also, involved in wound healing. On appropriate medications.    Levora Dredge, MD  07/06/2023 4:11 PM

## 2023-07-06 NOTE — Progress Notes (Signed)
Pt. Bled profusely under drsg. To right perm cath site. Drsg. Removed. Manual pressure held x 10 min. By RN. Pt. Tolerated well.

## 2023-07-06 NOTE — Progress Notes (Signed)
MD returned to room. Right upper chest reprepped with chlorhexadine & sterile drape applied. Sterile desolvable stick placed by Dr. Gilda Crease. Sterile 4x4 drsg. And Tegaderm to site. Pt. Tolerated well. VSS.

## 2023-07-06 NOTE — Op Note (Signed)
  OPERATIVE NOTE   PROCEDURE: Removal of a right IJ tunneled dialysis catheter  PRE-OPERATIVE DIAGNOSIS: Complication of dialysis catheter, End stage renal disease  POST-OPERATIVE DIAGNOSIS: Same  SURGEON: Levora Dredge, M.D.  ANESTHESIA: Local anesthetic with 1% lidocaine with epinephrine   ESTIMATED BLOOD LOSS: Minimal   FINDING(S): 1. Catheter intact   SPECIMEN(S):  Catheter  INDICATIONS:   Anita Greene is a 72 y.o. female who presents with functioning left arm fistula.  The patient has undergone placement of an extremity access which is working and this has been successfully cannulated without difficulty.  therefore is undergoing removal of his tunneled catheter which is no longer needed to avoid septic complications.   DESCRIPTION: After obtaining full informed written consent, the patient was positioned supine. The right IJ catheter and surrounding area is prepped and draped in a sterile fashion. The cuff was localized by palpation and noted to be less than 3 cm from the exit site. After appropriate timeout is called, 1% lidocaine with epinephrine is infiltrated into the surrounding tissues around the cuff. Small transverse incision is created at the exit site with an 11 blade scalpel and the dissection was carried up along the catheter to expose the cuff of the tunneled catheter.  The catheter cuff is then freed from the surrounding attachments and adhesions. Once the catheter has been freed circumferentially it is removed in 1 piece. Light pressure was held at the base of the neck.   Antibiotic ointment and a sterile dressing is applied to the exit site. Patient tolerated procedure well and there were no complications.  COMPLICATIONS: None  CONDITION: Unchanged  Levora Dredge, M.D. [facility] Office: [phone]  07/06/2023,4:20 PM

## 2023-07-07 ENCOUNTER — Encounter: Payer: Self-pay | Admitting: Vascular Surgery

## 2023-07-13 DIAGNOSIS — Z992 Dependence on renal dialysis: Secondary | ICD-10-CM | POA: Diagnosis not present

## 2023-07-13 DIAGNOSIS — N186 End stage renal disease: Secondary | ICD-10-CM | POA: Diagnosis not present

## 2023-07-26 DIAGNOSIS — Z992 Dependence on renal dialysis: Secondary | ICD-10-CM | POA: Diagnosis not present

## 2023-07-26 DIAGNOSIS — N186 End stage renal disease: Secondary | ICD-10-CM | POA: Diagnosis not present

## 2023-07-27 DIAGNOSIS — Z23 Encounter for immunization: Secondary | ICD-10-CM | POA: Diagnosis not present

## 2023-07-27 DIAGNOSIS — Z992 Dependence on renal dialysis: Secondary | ICD-10-CM | POA: Diagnosis not present

## 2023-07-27 DIAGNOSIS — D509 Iron deficiency anemia, unspecified: Secondary | ICD-10-CM | POA: Diagnosis not present

## 2023-07-27 DIAGNOSIS — N186 End stage renal disease: Secondary | ICD-10-CM | POA: Diagnosis not present

## 2023-08-05 DIAGNOSIS — E7849 Other hyperlipidemia: Secondary | ICD-10-CM | POA: Diagnosis not present

## 2023-08-05 DIAGNOSIS — R42 Dizziness and giddiness: Secondary | ICD-10-CM | POA: Diagnosis not present

## 2023-08-05 DIAGNOSIS — N2581 Secondary hyperparathyroidism of renal origin: Secondary | ICD-10-CM | POA: Diagnosis not present

## 2023-08-05 DIAGNOSIS — H04123 Dry eye syndrome of bilateral lacrimal glands: Secondary | ICD-10-CM | POA: Diagnosis not present

## 2023-08-05 DIAGNOSIS — N186 End stage renal disease: Secondary | ICD-10-CM | POA: Diagnosis not present

## 2023-08-05 DIAGNOSIS — E119 Type 2 diabetes mellitus without complications: Secondary | ICD-10-CM | POA: Diagnosis not present

## 2023-08-05 DIAGNOSIS — G4700 Insomnia, unspecified: Secondary | ICD-10-CM | POA: Diagnosis not present

## 2023-08-05 DIAGNOSIS — I10 Essential (primary) hypertension: Secondary | ICD-10-CM | POA: Diagnosis not present

## 2023-08-05 DIAGNOSIS — F39 Unspecified mood [affective] disorder: Secondary | ICD-10-CM | POA: Diagnosis not present

## 2023-08-05 DIAGNOSIS — K2100 Gastro-esophageal reflux disease with esophagitis, without bleeding: Secondary | ICD-10-CM | POA: Diagnosis not present

## 2023-08-05 DIAGNOSIS — I69351 Hemiplegia and hemiparesis following cerebral infarction affecting right dominant side: Secondary | ICD-10-CM | POA: Diagnosis not present

## 2023-08-09 ENCOUNTER — Other Ambulatory Visit (INDEPENDENT_AMBULATORY_CARE_PROVIDER_SITE_OTHER): Payer: Self-pay | Admitting: Nurse Practitioner

## 2023-08-09 DIAGNOSIS — N186 End stage renal disease: Secondary | ICD-10-CM

## 2023-08-12 ENCOUNTER — Ambulatory Visit (INDEPENDENT_AMBULATORY_CARE_PROVIDER_SITE_OTHER): Payer: PPO | Admitting: Nurse Practitioner

## 2023-08-12 ENCOUNTER — Encounter (INDEPENDENT_AMBULATORY_CARE_PROVIDER_SITE_OTHER): Payer: Self-pay | Admitting: Nurse Practitioner

## 2023-08-12 ENCOUNTER — Ambulatory Visit (INDEPENDENT_AMBULATORY_CARE_PROVIDER_SITE_OTHER): Payer: PPO

## 2023-08-12 VITALS — BP 114/74 | HR 80 | Resp 18 | Ht 60.0 in | Wt 127.6 lb

## 2023-08-12 DIAGNOSIS — N186 End stage renal disease: Secondary | ICD-10-CM

## 2023-08-12 DIAGNOSIS — I10 Essential (primary) hypertension: Secondary | ICD-10-CM

## 2023-08-12 DIAGNOSIS — E1149 Type 2 diabetes mellitus with other diabetic neurological complication: Secondary | ICD-10-CM

## 2023-08-13 ENCOUNTER — Telehealth (INDEPENDENT_AMBULATORY_CARE_PROVIDER_SITE_OTHER): Payer: Self-pay

## 2023-08-13 NOTE — Telephone Encounter (Signed)
I attempted to contact the patient to schedule her for a left arm fistulagram with Dr. Wyn Quaker on 08/19/23. A 1:00 pm arrival time to the [facility]. Pre-procedure instructions will be sent to Mychart and mailed. A message was left for a return call.

## 2023-08-13 NOTE — Telephone Encounter (Signed)
Elly Modena [facility] called wanting to know if the patient had been scheduled for a procedure. I let Will know she was scheduled for 08/19/23. Will wanted to know if we could move that up as the patient is having trouble. Patient has been rescheduled to 08/16/23 with a 9:30 am arrival time to the [facility]. This will be faxed to Will at [facility].

## 2023-08-16 ENCOUNTER — Encounter
Admission: RE | Disposition: A | Discharge status: Home / Self Care | Payer: Self-pay | Source: Ambulatory Visit | Attending: Vascular Surgery

## 2023-08-16 ENCOUNTER — Ambulatory Visit
Admission: RE | Admit: 2023-08-16 | Discharge: 2023-08-16 | Disposition: A | Discharge status: Home / Self Care | Payer: PPO | Source: Ambulatory Visit | Attending: Vascular Surgery | Admitting: Vascular Surgery

## 2023-08-16 ENCOUNTER — Encounter: Payer: Self-pay | Admitting: Vascular Surgery

## 2023-08-16 ENCOUNTER — Other Ambulatory Visit: Payer: Self-pay

## 2023-08-16 DIAGNOSIS — N186 End stage renal disease: Secondary | ICD-10-CM | POA: Diagnosis not present

## 2023-08-16 DIAGNOSIS — Z8249 Family history of ischemic heart disease and other diseases of the circulatory system: Secondary | ICD-10-CM | POA: Diagnosis not present

## 2023-08-16 DIAGNOSIS — I69351 Hemiplegia and hemiparesis following cerebral infarction affecting right dominant side: Secondary | ICD-10-CM | POA: Insufficient documentation

## 2023-08-16 DIAGNOSIS — Y832 Surgical operation with anastomosis, bypass or graft as the cause of abnormal reaction of the patient, or of later complication, without mention of misadventure at the time of the procedure: Secondary | ICD-10-CM | POA: Insufficient documentation

## 2023-08-16 DIAGNOSIS — Z992 Dependence on renal dialysis: Secondary | ICD-10-CM | POA: Diagnosis not present

## 2023-08-16 DIAGNOSIS — T82858A Stenosis of vascular prosthetic devices, implants and grafts, initial encounter: Secondary | ICD-10-CM | POA: Diagnosis not present

## 2023-08-16 DIAGNOSIS — E1122 Type 2 diabetes mellitus with diabetic chronic kidney disease: Secondary | ICD-10-CM | POA: Diagnosis not present

## 2023-08-16 DIAGNOSIS — Z9884 Bariatric surgery status: Secondary | ICD-10-CM | POA: Insufficient documentation

## 2023-08-16 DIAGNOSIS — Z9049 Acquired absence of other specified parts of digestive tract: Secondary | ICD-10-CM | POA: Diagnosis not present

## 2023-08-16 DIAGNOSIS — I120 Hypertensive chronic kidney disease with stage 5 chronic kidney disease or end stage renal disease: Secondary | ICD-10-CM | POA: Diagnosis not present

## 2023-08-16 HISTORY — PX: A/V FISTULAGRAM: CATH118298

## 2023-08-16 LAB — POTASSIUM (ARMC VASCULAR LAB ONLY): Potassium (ARMC vascular lab): 4.1 mmol/L (ref 3.5–5.1)

## 2023-08-16 SURGERY — A/V FISTULAGRAM
Anesthesia: Moderate Sedation | Laterality: Left

## 2023-08-16 MED ORDER — HEPARIN SODIUM (PORCINE) 1000 UNIT/ML IJ SOLN
INTRAMUSCULAR | Status: AC
  Filled 2023-08-16: qty 10

## 2023-08-16 MED ORDER — FAMOTIDINE 20 MG PO TABS: 40.0000 mg | ORAL_TABLET | Freq: Once | ORAL | Status: DC | PRN

## 2023-08-16 MED ORDER — HYDROMORPHONE HCL 1 MG/ML IJ SOLN: 1.0000 mg | Freq: Once | INTRAMUSCULAR | Status: DC | PRN

## 2023-08-16 MED ORDER — ONDANSETRON HCL 4 MG/2ML IJ SOLN: 4.0000 mg | Freq: Four times a day (QID) | INTRAMUSCULAR | Status: DC | PRN

## 2023-08-16 MED ORDER — VANCOMYCIN HCL IN DEXTROSE 1-5 GM/200ML-% IV SOLN
INTRAVENOUS | Status: AC
Start: 2023-08-16 — End: ?
  Filled 2023-08-16: qty 200

## 2023-08-16 MED ORDER — DIPHENHYDRAMINE HCL 50 MG/ML IJ SOLN: 50.0000 mg | Freq: Once | INTRAMUSCULAR | Status: DC | PRN

## 2023-08-16 MED ORDER — HEPARIN (PORCINE) IN NACL 1000-0.9 UT/500ML-% IV SOLN
INTRAVENOUS | Status: DC | PRN
  Administered 2023-08-16: 1000 mL

## 2023-08-16 MED ORDER — LIDOCAINE-EPINEPHRINE (PF) 1 %-1:200000 IJ SOLN
INTRAMUSCULAR | Status: DC | PRN
  Administered 2023-08-16: 5 mL via INTRADERMAL

## 2023-08-16 MED ORDER — METHYLPREDNISOLONE SODIUM SUCC 125 MG IJ SOLR: 125.0000 mg | Freq: Once | INTRAMUSCULAR | Status: DC | PRN

## 2023-08-16 MED ORDER — MIDAZOLAM HCL 2 MG/ML PO SYRP: 8.0000 mg | ORAL_SOLUTION | Freq: Once | ORAL | Status: DC | PRN

## 2023-08-16 MED ORDER — FENTANYL CITRATE (PF) 100 MCG/2ML IJ SOLN
INTRAMUSCULAR | Status: DC | PRN
  Administered 2023-08-16: 50 ug via INTRAVENOUS

## 2023-08-16 MED ORDER — IODIXANOL 320 MG/ML IV SOLN
INTRAVENOUS | Status: DC | PRN
  Administered 2023-08-16: 30 mL

## 2023-08-16 MED ORDER — SODIUM CHLORIDE 0.9 % IV SOLN: INTRAVENOUS | Status: DC

## 2023-08-16 MED ORDER — HEPARIN SODIUM (PORCINE) 1000 UNIT/ML IJ SOLN
INTRAMUSCULAR | Status: DC | PRN
  Administered 2023-08-16: 3000 [IU] via INTRAVENOUS

## 2023-08-16 MED ORDER — MIDAZOLAM HCL 2 MG/2ML IJ SOLN
INTRAMUSCULAR | Status: AC
  Filled 2023-08-16: qty 2

## 2023-08-16 MED ORDER — FENTANYL CITRATE PF 50 MCG/ML IJ SOSY
PREFILLED_SYRINGE | INTRAMUSCULAR | Status: AC
  Filled 2023-08-16: qty 1

## 2023-08-16 MED ORDER — VANCOMYCIN HCL IN DEXTROSE 1-5 GM/200ML-% IV SOLN
1000.0000 mg | INTRAVENOUS | Status: AC
  Administered 2023-08-16: 1000 mg via INTRAVENOUS

## 2023-08-16 MED ORDER — MIDAZOLAM HCL 2 MG/2ML IJ SOLN
INTRAMUSCULAR | Status: DC | PRN
  Administered 2023-08-16: 1 mg via INTRAVENOUS
  Administered 2023-08-16 (×2): .5 mg via INTRAVENOUS

## 2023-08-16 SURGICAL SUPPLY — 18 items
BALLN LUTONIX 7X100X130 (BALLOONS) ×1
BALLN LUTONIX DCB 6X100X130 (BALLOONS) ×1
BALLN LUTONIX DCB 6X40X130 (BALLOONS) ×1
BALLOON LUTONIX 7X100X130 (BALLOONS) IMPLANT
BALLOON LUTONIX DCB 6X100X130 (BALLOONS) IMPLANT
BALLOON LUTONIX DCB 6X40X130 (BALLOONS) IMPLANT
CANNULA 5F STIFF (CANNULA) IMPLANT
COVER PROBE ULTRASOUND 5X96 (MISCELLANEOUS) IMPLANT
DRAPE BRACHIAL (DRAPES) IMPLANT
GLIDEWIRE ADV .035X180CM (WIRE) IMPLANT
KIT ENCORE 26 ADVANTAGE (KITS) IMPLANT
PACK ANGIOGRAPHY (CUSTOM PROCEDURE TRAY) ×1 IMPLANT
SHEATH BRITE TIP 6FRX5.5 (SHEATH) IMPLANT
SHEATH BRITE TIP 7FRX5.5 (SHEATH) IMPLANT
STENT VIABAHN 8X100X120 (Permanent Stent) ×1 IMPLANT
STENT VIABAHN 8X10X120 (Permanent Stent) IMPLANT
SUT MNCRL AB 4-0 PS2 18 (SUTURE) IMPLANT
WIRE G 018X200 V18 (WIRE) IMPLANT

## 2023-08-16 NOTE — Op Note (Signed)
[facility]    OPERATIVE NOTE   PROCEDURE: 1.   Left brachiocephalic arteriovenous fistula cannulation under ultrasound guidance 2.   Left arm fistulagram including central venogram 3.   Percutaneous transluminal angioplasty of the mid upper arm cephalic vein stenosis with 6 mm diameter by 4 cm length Lutonix drug-coated angioplasty balloon 4.   Percutaneous transluminal angioplasty of the cephalic vein subclavian vein confluence with 6 mm diameter by 10 cm length Lutonix drug-coated angioplasty balloon 5.   Stent placement to the cephalic vein subclavian vein confluence with 8 mm diameter by 10 cm length Viabahn stent  PRE-OPERATIVE DIAGNOSIS: 1. ESRD 2. Poorly functional left brachiocephalic AVF  POST-OPERATIVE DIAGNOSIS: same as above   SURGEON: Festus Barren, MD  ANESTHESIA: local with MCS  ESTIMATED BLOOD LOSS: 5 cc  FINDING(S): This demonstrated a mid upper arm cephalic vein vein valve stenosis with a napkin ringlike lesion of greater than 80%.  The vessel then normalized until the cephalic vein subclavian vein confluence where there was another 80% or greater stenosis tracking down in the most proximal cephalic vein into the subclavian vein.  The remainder of the central venous circulation beyond this lesion was patent.   SPECIMEN(S):  None  CONTRAST: 30 cc  FLUORO TIME: 2.4 minutes  MODERATE CONSCIOUS SEDATION TIME: Approximately 26 minutes with 2 mg of Versed and 50 mcg of Fentanyl   INDICATIONS: Anita Greene is a 72 y.o. female who presents with malfunctioning left brachiocephalic arteriovenous fistula.  The patient is scheduled for left arm fistulagram.  The patient is aware the risks include but are not limited to: bleeding, infection, thrombosis of the cannulated access, and possible anaphylactic reaction to the contrast.  The patient is aware of the risks of the procedure and elects to proceed forward.  DESCRIPTION: After full informed  written consent was obtained, the patient was brought back to the angiography suite and placed supine upon the angiography table.  The patient was connected to monitoring equipment. Moderate conscious sedation was administered with a face to face encounter with the patient throughout the procedure with my supervision of the RN administering medicines and monitoring the patient's vital signs and mental status throughout from the start of the procedure until the patient was taken to the recovery room. The left arm was prepped and draped in the standard fashion for a percutaneous access intervention.  Under ultrasound guidance, the left brachiocephalic arteriovenous fistula was cannulated with a micropuncture needle under direct ultrasound guidance where it was patent and a permanent image was performed.  The microwire was advanced into the fistula and the needle was exchanged for the a microsheath.  I then upsized to a 6 Fr Sheath and later a 7 French sheath and imaging was performed.  Hand injections were completed to image the access including the central venous system. This demonstrated a mid upper arm cephalic vein vein valve stenosis with a napkin ringlike lesion of greater than 80%.  The vessel then normalized until the cephalic vein subclavian vein confluence where there was another 80% or greater stenosis tracking down in the most proximal cephalic vein into the subclavian vein.  The remainder of the central venous circulation beyond this lesion was patent.  Based on the images, this patient will need intervention to both areas. I then gave the patient 3000 units of intravenous heparin.  I then crossed the stenosis with an advantage wire.  Based on the imaging, a 6 mm x 4 cm Lutonix drug-coated  angioplasty balloon was selected.  The balloon was centered around the mid upper arm cephalic vein stenosis and inflated to 10 ATM for 1 minute(s).  On completion imaging, a 15-20% residual stenosis was present.   I  then turned my attention to the more central stenosis.  This was addressed with a 6 mm diameter by 10 cm length Lutonix drug-coated angioplasty balloon inflated to 12 atm for 1 minute.  Completion imaging showed greater than 50% residual stenosis so at this point I exchanged for a V18 wire and a 7 [place] sheath.  An 8 mm diameter by 10 cm length Viabahn stent was then deployed at the cephalic vein subclavian vein confluence and postdilated with a 7 mm diameter Lutonix drug-coated balloon with excellent angiographic completion result and less than 10% residual stenosis.  Based on the completion imaging, no further intervention is necessary.  The wire and balloon were removed from the sheath.  A 4-0 Monocryl purse-string suture was sewn around the sheath.  The sheath was removed while tying down the suture.  A sterile bandage was applied to the puncture site.  COMPLICATIONS: None  CONDITION: Stable   Festus Barren  08/16/2023 11:04 AM   This note was created with Dragon Medical transcription system. Any errors in dictation are purely unintentional.

## 2023-08-16 NOTE — Interval H&P Note (Signed)
History and Physical Interval Note:  08/16/2023 9:36 AM  Anita Greene  has presented today for surgery, with the diagnosis of L arm fistulagram  End Stage Renal.  The various methods of treatment have been discussed with the patient and family. After consideration of risks, benefits and other options for treatment, the patient has consented to  Procedure(s): A/V Fistulagram (Left) as a surgical intervention.  The patient's history has been reviewed, patient examined, no change in status, stable for surgery.  I have reviewed the patient's chart and labs.  Questions were answered to the patient's satisfaction.     Festus Barren

## 2023-08-16 NOTE — Progress Notes (Signed)
Subjective:    Patient ID: Anita Greene, female    DOB: 1951/04/16, 72 y.o.   MRN: [id number] Chief Complaint  Patient presents with   Follow-up    LS 04/2023 fb. HDA + consult. pulsitile fistula - not thrill. kolluru.    The patient returns to the office for followup status post intervention of the dialysis access left brachiocephalic AV fistula.   Following the intervention the excess function was unchanged per the patient.  The patient continues to be experiencing increased bleeding times following decannulation and increased recirculation with diminished efficiency of their dialysis. The patient denies an increase in arm swelling. At the present time the patient denies hand pain.  No recent shortening of the patient's walking distance or new symptoms consistent with claudication.  No history of rest pain symptoms. No new ulcers or wounds of the lower extremities have occurred.  The patient denies amaurosis fugax or recent TIA symptoms. There are no recent neurological changes noted. There is no history of DVT, PE or superficial thrombophlebitis. No recent episodes of angina or shortness of breath documented.   Duplex ultrasound of the AV access shows a patent access.  The previously noted stenosis is not improved compared to last study.  Flow volume today is 687 cc/min (previous flow volume was 914 cc/min)    Review of Systems  Hematological:  Does not bruise/bleed easily.  All other systems reviewed and are negative.      Objective:   Physical Exam Vitals reviewed.  HENT:     Head: Normocephalic.  Cardiovascular:     Rate and Rhythm: Normal rate.     Pulses:          Radial pulses are 1+ on the right side and 1+ on the left side.     Arteriovenous access: Left arteriovenous access is present.    Comments: Pulsatile thrill with faint bruit Pulmonary:     Effort: Pulmonary effort is normal.  Skin:    General: Skin is warm and dry.  Neurological:     Mental Status:  She is alert and oriented to person, place, and time.  Psychiatric:        Mood and Affect: Mood normal.        Behavior: Behavior normal.        Thought Content: Thought content normal.     BP 114/74 (BP Location: Left Arm)   Pulse 80   Resp 18   Ht 5' (1.524 m)   Wt 127 lb 9.6 oz (57.9 kg)   BMI 24.92 kg/m   Past Medical History:  Diagnosis Date   Allergic rhinitis due to pollen    Arthritis    B12 deficiency    Cataract cortical, senile    Chronic kidney disease    Colon polyp    Depression    End stage chronic kidney disease (HCC)    Fibromyalgia    GERD (gastroesophageal reflux disease)    Hemiparesis affecting right side as late effect of cerebrovascular accident (CVA) (HCC)    Hyperlipidemia    Hypertension    IBS (irritable bowel syndrome)    diarrhea prone   Iron deficiency anemia    Irritable bowel syndrome with diarrhea    Macular degeneration of left eye    Migraine headache    Mood disorder (HCC)    Obstructive sleep apnea    Optic atrophy of right eye    Osteoporosis    Paraesophageal hernia    PFO (patent  foramen ovale)    Pneumonia    RLS (restless legs syndrome)    Secondary hyperparathyroidism, renal (HCC)    Sleep disturbance    Stroke (HCC)    Type 2 diabetes mellitus with neurological manifestations, controlled (HCC)     Social History   Socioeconomic History   Marital status: Widowed    Spouse name: Not on file   Number of children: 2   Years of education: Not on file   Highest education level: Not on file  Occupational History   Occupation: [employer]  Tobacco Use   Smoking status: Never   Smokeless tobacco: Never  Vaping Use   Vaping status: Never Used  Substance and Sexual Activity   Alcohol use: Never    Alcohol/week: 0.0 standard drinks of alcohol   Drug use: Never   Sexual activity: Not on file  Other Topics Concern   Not on file  Social History Narrative   1 son died    1 son living--2 grandchildren       Had living will--not sure about it   No formal health care POA--would want son   Would accept resuscitation   No prolonged tube feeds if cognitively unaware   Social Determinants of Health   Financial Resource Strain: Not on file  Food Insecurity: Not on file  Transportation Needs: Not on file  Physical Activity: Not on file  Stress: No Stress Concern Present (01/13/2023)   Received from [facility], [facility]   [employer] [facility] - Occupational Stress Questionnaire    Feeling of Stress : Not at all  Social Connections: Unknown (12/31/2022)   Received from [facility], [facility]   Social Network    Social Network: Not on file  Intimate Partner Violence: Unknown (12/31/2022)   Received from [facility], [facility]   HITS    Physically Hurt: Not on file    Insult or Talk Down To: Not on file    Threaten Physical Harm: Not on file    Scream or Curse: Not on file    Past Surgical History:  Procedure Laterality Date   A/V FISTULAGRAM Left 06/03/2023   Procedure: A/V Fistulagram;  Surgeon: Annice Needy, MD;  Location: [facility];  Service: Cardiovascular;  Laterality: Left;   AV FISTULA PLACEMENT Left 04/14/2023   Procedure: ARTERIOVENOUS (AV) FISTULA CREATION;  Surgeon: Annice Needy, MD;  Location: [facility];  Service: Vascular;  Laterality: Left;   CATARACT EXTRACTION W/ INTRAOCULAR LENS  IMPLANT, BILATERAL     CHOLECYSTECTOMY     COLONOSCOPY     COLONOSCOPY WITH PROPOFOL N/A 04/04/2018   Procedure: COLONOSCOPY WITH PROPOFOL;  Surgeon: Christena Deem, MD;  Location: [facility];  Service: Endoscopy;  Laterality: N/A;   DIALYSIS/PERMA CATHETER INSERTION N/A 01/23/2022   Procedure: DIALYSIS/PERMA CATHETER INSERTION;  Surgeon: Annice Needy, MD;  Location: [facility];  Service: Cardiovascular;  Laterality: N/A;   DIALYSIS/PERMA CATHETER INSERTION N/A 01/28/2023   Procedure: DIALYSIS/PERMA CATHETER INSERTION;  Surgeon: Annice Needy, MD;  Location: [facility];  Service: Cardiovascular;  Laterality: N/A;   DIALYSIS/PERMA CATHETER REMOVAL N/A 06/15/2022   Procedure: DIALYSIS/PERMA CATHETER REMOVAL;  Surgeon: Annice Needy, MD;  Location: [facility];  Service: Cardiovascular;  Laterality: N/A;   DIALYSIS/PERMA CATHETER REMOVAL N/A 07/06/2023   Procedure: DIALYSIS/PERMA CATHETER REMOVAL;  Surgeon: Renford Dills, MD;  Location: [facility];  Service: Cardiovascular;  Laterality:  N/A;   ESOPHAGOGASTRODUODENOSCOPY (EGD) WITH PROPOFOL N/A 04/04/2018   Procedure: ESOPHAGOGASTRODUODENOSCOPY (EGD) WITH PROPOFOL;  Surgeon: Christena Deem, MD;  Location: [facility];  Service: Endoscopy;  Laterality: N/A;   HIATAL HERNIA REPAIR  1990   NISSEN FUNDOPLICATION  2021   PERITONEAL CATHETER INSERTION  04/09/2022   PERITONEAL CATHETER REMOVAL  02/2023   ROUX-EN-Y GASTRIC BYPASS  2021   TONSILLECTOMY AND ADENOIDECTOMY     TUBAL LIGATION     UPPER GASTROINTESTINAL ENDOSCOPY     Uroplasty  ~2000's   Dr Lonna Cobb    Family History  Problem Relation Age of Onset   Cancer Mother        lung cancer   Parkinson's disease Father    Dementia Father    COPD Father    Heart disease Father    Cancer Sister        lung cancer   Cancer Son        Ewing's sarcoma   Breast cancer Neg Hx     Allergies  Allergen Reactions   Cefditoren Pivoxil     Other reaction(s): Other (See Comments) GI issues, severe abdominal pain   Clarithromycin     Other reaction(s): Other (See Comments) GI issues   Codeine Sulfate Itching   Erythromycin Other (See Comments)    GI issues   Other Other (See Comments)    Oral iron   Pseudoephedrine Other (See Comments)    Altered mental status   Sulfa Antibiotics Itching       Latest Ref Rng & Units 04/14/2023   10:25 AM 04/12/2023    3:43 PM 01/12/2023   11:43 AM  CBC  WBC 4.0 - 10.5 K/uL  7.3  10.1   Hemoglobin 12.0 - 15.0 g/dL 40.9  81.1  91.4   Hematocrit  36.0 - 46.0 % 41.0  42.9  39.4   Platelets 150 - 400 K/uL  238  392       CMP     Component Value Date/Time   NA 139 04/14/2023 1025   K 4.6 06/03/2023 1237   CL 106 04/14/2023 1025   CO2 22 04/12/2023 1543   GLUCOSE 99 04/14/2023 1025   BUN 46 (H) 04/14/2023 1025   CREATININE 4.80 (H) 04/14/2023 1025   CALCIUM 8.3 (L) 04/12/2023 1543   PROT 7.0 01/21/2022 1036   ALBUMIN 3.4 (L) 01/12/2023 1143   AST 20 01/21/2022 1036   ALT 15 01/21/2022 1036   ALKPHOS 88 01/21/2022 1036   BILITOT 0.7 01/21/2022 1036   GFR 40.72 (L) 01/01/2017 1432   GFRNONAA 25 (L) 04/12/2023 1543     No results found.     Assessment & Plan:   1. ESRD (end stage renal disease) (HCC) Recommend:  The patient is experiencing increasing problems with their dialysis access.  Patient should have a fistulagram with the intention for intervention.  The intention for intervention is to restore appropriate flow and prevent thrombosis and possible loss of the access.  As well as improve the quality of dialysis therapy.  The risks, benefits and alternative therapies were reviewed in detail with the patient.  All questions were answered.  The patient agrees to proceed with angio/intervention.    The patient will follow up with me in the office after the procedure.   2. Type 2 diabetes mellitus with neurological manifestations, controlled (HCC) Continue hypoglycemic medications as already ordered, these medications have been reviewed and there are no changes at this time.  Hgb A1C  to be monitored as already arranged by primary service  3. Primary hypertension Continue antihypertensive medications as already ordered, these medications have been reviewed and there are no changes at this time.   Current Outpatient Medications on File Prior to Visit  Medication Sig Dispense Refill   aspirin EC 81 MG tablet Take 81 mg by mouth at bedtime.     buPROPion (WELLBUTRIN XL) 150 MG 24 hr tablet Take 150 mg by mouth  at bedtime.     busPIRone (BUSPAR) 15 MG tablet Take 15 mg by mouth 3 (three) times daily.     calcium acetate (PHOSLO) 667 MG tablet Take 1,334 mg by mouth 3 (three) times daily.     carvedilol (COREG) 6.25 MG tablet Take 6.25 mg by mouth 2 (two) times daily with a meal.     cetirizine (ZYRTEC) 10 MG tablet Take 1 tablet (10 mg total) by mouth daily. (Patient taking differently: Take 10 mg by mouth at bedtime.) 90 tablet 3   cholecalciferol (VITAMIN D3) 10 MCG (400 UNIT) TABS tablet Take 2,000 Units by mouth daily.     diphenoxylate-atropine (LOMOTIL) 2.5-0.025 MG tablet TAKE 1 TABLET BY MOUTH 4 TIMES DAILY AS NEEDED FOR DIARRHEA OR LOOSE STOOLS. 90 tablet 0   DULoxetine (CYMBALTA) 30 MG capsule Take 1 capsule (30 mg total) by mouth daily. (Patient taking differently: Take 30 mg by mouth at bedtime.) 90 capsule 3   famotidine (PEPCID) 20 MG tablet Take 20 mg by mouth daily.     furosemide (LASIX) 40 MG tablet Take 40 mg by mouth daily. [id number]     gabapentin (NEURONTIN) 100 MG capsule Take 100 mg by mouth at bedtime.     Lifitegrast (XIIDRA) 5 % SOLN Apply to eye.     magnesium gluconate (MAGONATE) 500 MG tablet Take 500 mg by mouth daily.     Magnesium Oxide -Mg Supplement 500 MG TABS Take by mouth.     midodrine (PROAMATINE) 10 MG tablet Take 10 mg by mouth 3 (three) times daily as needed.     Multiple Vitamins-Minerals (ICAPS) TABS Take 1 tablet by mouth daily.     multivitamin (RENA-VIT) TABS tablet Take 1 tablet by mouth at bedtime.     ondansetron (ZOFRAN) 4 MG tablet Take by mouth as needed.     spironolactone (ALDACTONE) 25 MG tablet Take 25 mg by mouth daily.     valsartan (DIOVAN) 160 MG tablet Take 160 mg by mouth daily.     verapamil (CALAN) 40 MG tablet Take 20 mg by mouth as directed. Tuesday, Wednesday, Thursday,Saturday,Sunday     vitamin B-12 (CYANOCOBALAMIN) 500 MCG tablet Take 500 mcg by mouth daily.     zolpidem (AMBIEN) 5 MG tablet Take 5 mg by mouth at bedtime as  needed for sleep.     HYDROcodone-acetaminophen (NORCO/VICODIN) 5-325 MG tablet Take 2 tablets by mouth every 6 (six) hours as needed for moderate pain. (Patient not taking: Reported on 06/03/2023) 20 tablet 0   potassium chloride SA (KLOR-CON M) 20 MEQ tablet Take 2 tablets (40 mEq) today, then 1 tablet (20 mEq) BID tomorrow, then 1 tablet (20 mEq) on the day of surgery. Follow up with PCP/nephrology for repeat labs. (Patient not taking: Reported on 07/06/2023) 5 tablet 0   sennosides-docusate sodium (SENOKOT-S) 8.6-50 MG tablet Take 1 tablet by mouth daily as needed. Do not take this med within 2 hours of other meds (Patient not taking: Reported on 06/03/2023)     Current Facility-Administered Medications  on File Prior to Visit  Medication Dose Route Frequency Provider Last Rate Last Admin   lidocaine-EPINEPHrine (PF) (XYLOCAINE-EPINEPHrine) 1 %-1:200000 (PF) injection    PRN Annice Needy, MD   10 mL at 06/03/23 1345    There are no Patient Instructions on file for this visit. No follow-ups on file.   Georgiana Spinner, NP

## 2023-08-16 NOTE — H&P (View-Only) (Signed)
Subjective:    Patient ID: Anita Greene, female    DOB: 1951/04/16, 72 y.o.   MRN: [id number] Chief Complaint  Patient presents with   Follow-up    LS 04/2023 fb. HDA + consult. pulsitile fistula - not thrill. kolluru.    The patient returns to the office for followup status post intervention of the dialysis access left brachiocephalic AV fistula.   Following the intervention the excess function was unchanged per the patient.  The patient continues to be experiencing increased bleeding times following decannulation and increased recirculation with diminished efficiency of their dialysis. The patient denies an increase in arm swelling. At the present time the patient denies hand pain.  No recent shortening of the patient's walking distance or new symptoms consistent with claudication.  No history of rest pain symptoms. No new ulcers or wounds of the lower extremities have occurred.  The patient denies amaurosis fugax or recent TIA symptoms. There are no recent neurological changes noted. There is no history of DVT, PE or superficial thrombophlebitis. No recent episodes of angina or shortness of breath documented.   Duplex ultrasound of the AV access shows a patent access.  The previously noted stenosis is not improved compared to last study.  Flow volume today is 687 cc/min (previous flow volume was 914 cc/min)    Review of Systems  Hematological:  Does not bruise/bleed easily.  All other systems reviewed and are negative.      Objective:   Physical Exam Vitals reviewed.  HENT:     Head: Normocephalic.  Cardiovascular:     Rate and Rhythm: Normal rate.     Pulses:          Radial pulses are 1+ on the right side and 1+ on the left side.     Arteriovenous access: Left arteriovenous access is present.    Comments: Pulsatile thrill with faint bruit Pulmonary:     Effort: Pulmonary effort is normal.  Skin:    General: Skin is warm and dry.  Neurological:     Mental Status:  She is alert and oriented to person, place, and time.  Psychiatric:        Mood and Affect: Mood normal.        Behavior: Behavior normal.        Thought Content: Thought content normal.     BP 114/74 (BP Location: Left Arm)   Pulse 80   Resp 18   Ht 5' (1.524 m)   Wt 127 lb 9.6 oz (57.9 kg)   BMI 24.92 kg/m   Past Medical History:  Diagnosis Date   Allergic rhinitis due to pollen    Arthritis    B12 deficiency    Cataract cortical, senile    Chronic kidney disease    Colon polyp    Depression    End stage chronic kidney disease (HCC)    Fibromyalgia    GERD (gastroesophageal reflux disease)    Hemiparesis affecting right side as late effect of cerebrovascular accident (CVA) (HCC)    Hyperlipidemia    Hypertension    IBS (irritable bowel syndrome)    diarrhea prone   Iron deficiency anemia    Irritable bowel syndrome with diarrhea    Macular degeneration of left eye    Migraine headache    Mood disorder (HCC)    Obstructive sleep apnea    Optic atrophy of right eye    Osteoporosis    Paraesophageal hernia    PFO (patent  foramen ovale)    Pneumonia    RLS (restless legs syndrome)    Secondary hyperparathyroidism, renal (HCC)    Sleep disturbance    Stroke (HCC)    Type 2 diabetes mellitus with neurological manifestations, controlled (HCC)     Social History   Socioeconomic History   Marital status: Widowed    Spouse name: Not on file   Number of children: 2   Years of education: Not on file   Highest education level: Not on file  Occupational History   Occupation: [employer]  Tobacco Use   Smoking status: Never   Smokeless tobacco: Never  Vaping Use   Vaping status: Never Used  Substance and Sexual Activity   Alcohol use: Never    Alcohol/week: 0.0 standard drinks of alcohol   Drug use: Never   Sexual activity: Not on file  Other Topics Concern   Not on file  Social History Narrative   1 son died    1 son living--2 grandchildren       Had living will--not sure about it   No formal health care POA--would want son   Would accept resuscitation   No prolonged tube feeds if cognitively unaware   Social Determinants of Health   Financial Resource Strain: Not on file  Food Insecurity: Not on file  Transportation Needs: Not on file  Physical Activity: Not on file  Stress: No Stress Concern Present (01/13/2023)   Received from [facility], [facility]   [employer] [facility] - Occupational Stress Questionnaire    Feeling of Stress : Not at all  Social Connections: Unknown (12/31/2022)   Received from [facility], [facility]   Social Network    Social Network: Not on file  Intimate Partner Violence: Unknown (12/31/2022)   Received from [facility], [facility]   HITS    Physically Hurt: Not on file    Insult or Talk Down To: Not on file    Threaten Physical Harm: Not on file    Scream or Curse: Not on file    Past Surgical History:  Procedure Laterality Date   A/V FISTULAGRAM Left 06/03/2023   Procedure: A/V Fistulagram;  Surgeon: Annice Needy, MD;  Location: [facility];  Service: Cardiovascular;  Laterality: Left;   AV FISTULA PLACEMENT Left 04/14/2023   Procedure: ARTERIOVENOUS (AV) FISTULA CREATION;  Surgeon: Annice Needy, MD;  Location: [facility];  Service: Vascular;  Laterality: Left;   CATARACT EXTRACTION W/ INTRAOCULAR LENS  IMPLANT, BILATERAL     CHOLECYSTECTOMY     COLONOSCOPY     COLONOSCOPY WITH PROPOFOL N/A 04/04/2018   Procedure: COLONOSCOPY WITH PROPOFOL;  Surgeon: Christena Deem, MD;  Location: [facility];  Service: Endoscopy;  Laterality: N/A;   DIALYSIS/PERMA CATHETER INSERTION N/A 01/23/2022   Procedure: DIALYSIS/PERMA CATHETER INSERTION;  Surgeon: Annice Needy, MD;  Location: [facility];  Service: Cardiovascular;  Laterality: N/A;   DIALYSIS/PERMA CATHETER INSERTION N/A 01/28/2023   Procedure: DIALYSIS/PERMA CATHETER INSERTION;  Surgeon: Annice Needy, MD;  Location: [facility];  Service: Cardiovascular;  Laterality: N/A;   DIALYSIS/PERMA CATHETER REMOVAL N/A 06/15/2022   Procedure: DIALYSIS/PERMA CATHETER REMOVAL;  Surgeon: Annice Needy, MD;  Location: [facility];  Service: Cardiovascular;  Laterality: N/A;   DIALYSIS/PERMA CATHETER REMOVAL N/A 07/06/2023   Procedure: DIALYSIS/PERMA CATHETER REMOVAL;  Surgeon: Renford Dills, MD;  Location: [facility];  Service: Cardiovascular;  Laterality:  N/A;   ESOPHAGOGASTRODUODENOSCOPY (EGD) WITH PROPOFOL N/A 04/04/2018   Procedure: ESOPHAGOGASTRODUODENOSCOPY (EGD) WITH PROPOFOL;  Surgeon: Christena Deem, MD;  Location: [facility];  Service: Endoscopy;  Laterality: N/A;   HIATAL HERNIA REPAIR  1990   NISSEN FUNDOPLICATION  2021   PERITONEAL CATHETER INSERTION  04/09/2022   PERITONEAL CATHETER REMOVAL  02/2023   ROUX-EN-Y GASTRIC BYPASS  2021   TONSILLECTOMY AND ADENOIDECTOMY     TUBAL LIGATION     UPPER GASTROINTESTINAL ENDOSCOPY     Uroplasty  ~2000's   Dr Lonna Cobb    Family History  Problem Relation Age of Onset   Cancer Mother        lung cancer   Parkinson's disease Father    Dementia Father    COPD Father    Heart disease Father    Cancer Sister        lung cancer   Cancer Son        Ewing's sarcoma   Breast cancer Neg Hx     Allergies  Allergen Reactions   Cefditoren Pivoxil     Other reaction(s): Other (See Comments) GI issues, severe abdominal pain   Clarithromycin     Other reaction(s): Other (See Comments) GI issues   Codeine Sulfate Itching   Erythromycin Other (See Comments)    GI issues   Other Other (See Comments)    Oral iron   Pseudoephedrine Other (See Comments)    Altered mental status   Sulfa Antibiotics Itching       Latest Ref Rng & Units 04/14/2023   10:25 AM 04/12/2023    3:43 PM 01/12/2023   11:43 AM  CBC  WBC 4.0 - 10.5 K/uL  7.3  10.1   Hemoglobin 12.0 - 15.0 g/dL 40.9  81.1  91.4   Hematocrit  36.0 - 46.0 % 41.0  42.9  39.4   Platelets 150 - 400 K/uL  238  392       CMP     Component Value Date/Time   NA 139 04/14/2023 1025   K 4.6 06/03/2023 1237   CL 106 04/14/2023 1025   CO2 22 04/12/2023 1543   GLUCOSE 99 04/14/2023 1025   BUN 46 (H) 04/14/2023 1025   CREATININE 4.80 (H) 04/14/2023 1025   CALCIUM 8.3 (L) 04/12/2023 1543   PROT 7.0 01/21/2022 1036   ALBUMIN 3.4 (L) 01/12/2023 1143   AST 20 01/21/2022 1036   ALT 15 01/21/2022 1036   ALKPHOS 88 01/21/2022 1036   BILITOT 0.7 01/21/2022 1036   GFR 40.72 (L) 01/01/2017 1432   GFRNONAA 25 (L) 04/12/2023 1543     No results found.     Assessment & Plan:   1. ESRD (end stage renal disease) (HCC) Recommend:  The patient is experiencing increasing problems with their dialysis access.  Patient should have a fistulagram with the intention for intervention.  The intention for intervention is to restore appropriate flow and prevent thrombosis and possible loss of the access.  As well as improve the quality of dialysis therapy.  The risks, benefits and alternative therapies were reviewed in detail with the patient.  All questions were answered.  The patient agrees to proceed with angio/intervention.    The patient will follow up with me in the office after the procedure.   2. Type 2 diabetes mellitus with neurological manifestations, controlled (HCC) Continue hypoglycemic medications as already ordered, these medications have been reviewed and there are no changes at this time.  Hgb A1C  to be monitored as already arranged by primary service  3. Primary hypertension Continue antihypertensive medications as already ordered, these medications have been reviewed and there are no changes at this time.   Current Outpatient Medications on File Prior to Visit  Medication Sig Dispense Refill   aspirin EC 81 MG tablet Take 81 mg by mouth at bedtime.     buPROPion (WELLBUTRIN XL) 150 MG 24 hr tablet Take 150 mg by mouth  at bedtime.     busPIRone (BUSPAR) 15 MG tablet Take 15 mg by mouth 3 (three) times daily.     calcium acetate (PHOSLO) 667 MG tablet Take 1,334 mg by mouth 3 (three) times daily.     carvedilol (COREG) 6.25 MG tablet Take 6.25 mg by mouth 2 (two) times daily with a meal.     cetirizine (ZYRTEC) 10 MG tablet Take 1 tablet (10 mg total) by mouth daily. (Patient taking differently: Take 10 mg by mouth at bedtime.) 90 tablet 3   cholecalciferol (VITAMIN D3) 10 MCG (400 UNIT) TABS tablet Take 2,000 Units by mouth daily.     diphenoxylate-atropine (LOMOTIL) 2.5-0.025 MG tablet TAKE 1 TABLET BY MOUTH 4 TIMES DAILY AS NEEDED FOR DIARRHEA OR LOOSE STOOLS. 90 tablet 0   DULoxetine (CYMBALTA) 30 MG capsule Take 1 capsule (30 mg total) by mouth daily. (Patient taking differently: Take 30 mg by mouth at bedtime.) 90 capsule 3   famotidine (PEPCID) 20 MG tablet Take 20 mg by mouth daily.     furosemide (LASIX) 40 MG tablet Take 40 mg by mouth daily. [id number]     gabapentin (NEURONTIN) 100 MG capsule Take 100 mg by mouth at bedtime.     Lifitegrast (XIIDRA) 5 % SOLN Apply to eye.     magnesium gluconate (MAGONATE) 500 MG tablet Take 500 mg by mouth daily.     Magnesium Oxide -Mg Supplement 500 MG TABS Take by mouth.     midodrine (PROAMATINE) 10 MG tablet Take 10 mg by mouth 3 (three) times daily as needed.     Multiple Vitamins-Minerals (ICAPS) TABS Take 1 tablet by mouth daily.     multivitamin (RENA-VIT) TABS tablet Take 1 tablet by mouth at bedtime.     ondansetron (ZOFRAN) 4 MG tablet Take by mouth as needed.     spironolactone (ALDACTONE) 25 MG tablet Take 25 mg by mouth daily.     valsartan (DIOVAN) 160 MG tablet Take 160 mg by mouth daily.     verapamil (CALAN) 40 MG tablet Take 20 mg by mouth as directed. Tuesday, Wednesday, Thursday,Saturday,Sunday     vitamin B-12 (CYANOCOBALAMIN) 500 MCG tablet Take 500 mcg by mouth daily.     zolpidem (AMBIEN) 5 MG tablet Take 5 mg by mouth at bedtime as  needed for sleep.     HYDROcodone-acetaminophen (NORCO/VICODIN) 5-325 MG tablet Take 2 tablets by mouth every 6 (six) hours as needed for moderate pain. (Patient not taking: Reported on 06/03/2023) 20 tablet 0   potassium chloride SA (KLOR-CON M) 20 MEQ tablet Take 2 tablets (40 mEq) today, then 1 tablet (20 mEq) BID tomorrow, then 1 tablet (20 mEq) on the day of surgery. Follow up with PCP/nephrology for repeat labs. (Patient not taking: Reported on 07/06/2023) 5 tablet 0   sennosides-docusate sodium (SENOKOT-S) 8.6-50 MG tablet Take 1 tablet by mouth daily as needed. Do not take this med within 2 hours of other meds (Patient not taking: Reported on 06/03/2023)     Current Facility-Administered Medications  on File Prior to Visit  Medication Dose Route Frequency Provider Last Rate Last Admin   lidocaine-EPINEPHrine (PF) (XYLOCAINE-EPINEPHrine) 1 %-1:200000 (PF) injection    PRN Annice Needy, MD   10 mL at 06/03/23 1345    There are no Patient Instructions on file for this visit. No follow-ups on file.   Georgiana Spinner, NP

## 2023-08-18 ENCOUNTER — Encounter: Payer: Self-pay | Admitting: Vascular Surgery

## 2023-08-26 DIAGNOSIS — N186 End stage renal disease: Secondary | ICD-10-CM | POA: Diagnosis not present

## 2023-08-26 DIAGNOSIS — Z992 Dependence on renal dialysis: Secondary | ICD-10-CM | POA: Diagnosis not present

## 2023-08-27 DIAGNOSIS — D509 Iron deficiency anemia, unspecified: Secondary | ICD-10-CM | POA: Diagnosis not present

## 2023-08-27 DIAGNOSIS — N186 End stage renal disease: Secondary | ICD-10-CM | POA: Diagnosis not present

## 2023-08-27 DIAGNOSIS — D631 Anemia in chronic kidney disease: Secondary | ICD-10-CM | POA: Diagnosis not present

## 2023-08-27 DIAGNOSIS — Z992 Dependence on renal dialysis: Secondary | ICD-10-CM | POA: Diagnosis not present

## 2023-09-14 ENCOUNTER — Encounter (INDEPENDENT_AMBULATORY_CARE_PROVIDER_SITE_OTHER): Payer: Self-pay | Admitting: Vascular Surgery

## 2023-09-14 ENCOUNTER — Ambulatory Visit (INDEPENDENT_AMBULATORY_CARE_PROVIDER_SITE_OTHER): Payer: PPO | Admitting: Vascular Surgery

## 2023-09-14 VITALS — BP 110/67 | HR 83 | Resp 18 | Ht 60.0 in | Wt 130.4 lb

## 2023-09-14 DIAGNOSIS — I10 Essential (primary) hypertension: Secondary | ICD-10-CM | POA: Diagnosis not present

## 2023-09-14 DIAGNOSIS — E1149 Type 2 diabetes mellitus with other diabetic neurological complication: Secondary | ICD-10-CM

## 2023-09-14 DIAGNOSIS — N186 End stage renal disease: Secondary | ICD-10-CM | POA: Diagnosis not present

## 2023-09-14 NOTE — Assessment & Plan Note (Signed)
blood glucose control important in reducing the progression of atherosclerotic disease. Also, involved in wound healing. On appropriate medications.  

## 2023-09-14 NOTE — Progress Notes (Signed)
MRN : [id number]  Anita Greene is a 72 y.o. (1950-12-23) female who presents with chief complaint of  Chief Complaint  Patient presents with   Follow-up    4-5 week F/U  .  History of Present Illness: Patient returns today in follow up of her dialysis access.  We performed intervention on her left brachiocephalic AV fistula few weeks ago.  Since that time, it is working well.  She does have some numbness and tingling down her arm but also has significant neuropathy and gabapentin has made that better.  No periprocedural complications.  Her access has been working well for dialysis since that time.  Current Outpatient Medications  Medication Sig Dispense Refill   aspirin EC 81 MG tablet Take 81 mg by mouth at bedtime.     buPROPion (WELLBUTRIN XL) 150 MG 24 hr tablet Take 150 mg by mouth at bedtime.     busPIRone (BUSPAR) 15 MG tablet Take 15 mg by mouth 3 (three) times daily.     calcium acetate (PHOSLO) 667 MG tablet Take 1,334 mg by mouth 3 (three) times daily.     carvedilol (COREG) 6.25 MG tablet Take 6.25 mg by mouth 2 (two) times daily with a meal.     cetirizine (ZYRTEC) 10 MG tablet Take 1 tablet (10 mg total) by mouth daily. (Patient taking differently: Take 10 mg by mouth at bedtime.) 90 tablet 3   cholecalciferol (VITAMIN D3) 10 MCG (400 UNIT) TABS tablet Take 2,000 Units by mouth daily.     diphenoxylate-atropine (LOMOTIL) 2.5-0.025 MG tablet TAKE 1 TABLET BY MOUTH 4 TIMES DAILY AS NEEDED FOR DIARRHEA OR LOOSE STOOLS. 90 tablet 0   DULoxetine (CYMBALTA) 30 MG capsule Take 1 capsule (30 mg total) by mouth daily. (Patient taking differently: Take 30 mg by mouth at bedtime.) 90 capsule 3   famotidine (PEPCID) 20 MG tablet Take 20 mg by mouth daily.     furosemide (LASIX) 40 MG tablet Take 40 mg by mouth daily. [id number]     gabapentin (NEURONTIN) 100 MG capsule Take 100 mg by mouth at bedtime.     magnesium gluconate (MAGONATE) 500 MG tablet Take 500 mg by mouth daily.      midodrine (PROAMATINE) 10 MG tablet Take 10 mg by mouth 3 (three) times daily as needed.     Multiple Vitamins-Minerals (ICAPS) TABS Take 1 tablet by mouth daily.     multivitamin (RENA-VIT) TABS tablet Take 1 tablet by mouth at bedtime.     ondansetron (ZOFRAN) 4 MG tablet Take by mouth as needed.     spironolactone (ALDACTONE) 25 MG tablet Take 25 mg by mouth daily.     valsartan (DIOVAN) 160 MG tablet Take 160 mg by mouth daily.     verapamil (CALAN) 40 MG tablet Take 20 mg by mouth as directed. Tuesday, Wednesday, Thursday,Saturday,Sunday     vitamin B-12 (CYANOCOBALAMIN) 500 MCG tablet Take 500 mcg by mouth daily.     zolpidem (AMBIEN) 5 MG tablet Take 5 mg by mouth at bedtime as needed for sleep.     HYDROcodone-acetaminophen (NORCO/VICODIN) 5-325 MG tablet Take 2 tablets by mouth every 6 (six) hours as needed for moderate pain. (Patient not taking: Reported on 06/03/2023) 20 tablet 0   Lifitegrast (XIIDRA) 5 % SOLN Apply to eye. (Patient not taking: Reported on 09/14/2023)     Magnesium Oxide -Mg Supplement 500 MG TABS Take by mouth. (Patient not taking: Reported on 08/16/2023)     potassium  chloride SA (KLOR-CON M) 20 MEQ tablet Take 2 tablets (40 mEq) today, then 1 tablet (20 mEq) BID tomorrow, then 1 tablet (20 mEq) on the day of surgery. Follow up with PCP/nephrology for repeat labs. (Patient not taking: Reported on 07/06/2023) 5 tablet 0   sennosides-docusate sodium (SENOKOT-S) 8.6-50 MG tablet Take 1 tablet by mouth daily as needed. Do not take this med within 2 hours of other meds (Patient not taking: Reported on 08/16/2023)     No current facility-administered medications for this visit.   Facility-Administered Medications Ordered in Other Visits  Medication Dose Route Frequency Provider Last Rate Last Admin   lidocaine-EPINEPHrine (PF) (XYLOCAINE-EPINEPHrine) 1 %-1:200000 (PF) injection    PRN Annice Needy, MD   10 mL at 06/03/23 1345    Past Medical History:  Diagnosis Date    Allergic rhinitis due to pollen    Arthritis    B12 deficiency    Cataract cortical, senile    Chronic kidney disease    Colon polyp    Depression    End stage chronic kidney disease (HCC)    Fibromyalgia    GERD (gastroesophageal reflux disease)    Hemiparesis affecting right side as late effect of cerebrovascular accident (CVA) (HCC)    Hyperlipidemia    Hypertension    IBS (irritable bowel syndrome)    diarrhea prone   Iron deficiency anemia    Irritable bowel syndrome with diarrhea    Macular degeneration of left eye    Migraine headache    Mood disorder (HCC)    Obstructive sleep apnea    Optic atrophy of right eye    Osteoporosis    Paraesophageal hernia    PFO (patent foramen ovale)    Pneumonia    RLS (restless legs syndrome)    Secondary hyperparathyroidism, renal (HCC)    Sleep disturbance    Stroke (HCC)    Type 2 diabetes mellitus with neurological manifestations, controlled [facility])     Past Surgical History:  Procedure Laterality Date   A/V FISTULAGRAM Left 06/03/2023   Procedure: A/V Fistulagram;  Surgeon: Annice Needy, MD;  Location: [facility] INVASIVE CV LAB;  Service: Cardiovascular;  Laterality: Left;   A/V FISTULAGRAM Left 08/16/2023   Procedure: A/V Fistulagram;  Surgeon: Annice Needy, MD;  Location: [facility] INVASIVE CV [facility];  Service: Cardiovascular;  Laterality: Left;   AV FISTULA PLACEMENT Left 04/14/2023   Procedure: ARTERIOVENOUS (AV) FISTULA CREATION;  Surgeon: Annice Needy, MD;  Location: [facility] ORS;  Service: Vascular;  Laterality: Left;   CATARACT EXTRACTION W/ INTRAOCULAR LENS  IMPLANT, BILATERAL     CHOLECYSTECTOMY     COLONOSCOPY     COLONOSCOPY WITH PROPOFOL N/A 04/04/2018   Procedure: COLONOSCOPY WITH PROPOFOL;  Surgeon: Christena Deem, MD;  Location: [facility] ENDOSCOPY;  Service: Endoscopy;  Laterality: N/A;   DIALYSIS/PERMA CATHETER INSERTION N/A 01/23/2022   Procedure: DIALYSIS/PERMA CATHETER INSERTION;  Surgeon: Annice Needy, MD;  Location: [facility]  INVASIVE CV LAB;  Service: Cardiovascular;  Laterality: N/A;   DIALYSIS/PERMA CATHETER INSERTION N/A 01/28/2023   Procedure: DIALYSIS/PERMA CATHETER INSERTION;  Surgeon: Annice Needy, MD;  Location: [facility] INV[facility] CV [facility];  Service: Cardiovascular;  Laterality: N/A;   DIALYSIS/PERMA CATHETER REMOVAL N/A 06/15/2022   Procedure: DIALYSIS/PERMA CATHETER REMOVAL;  Surgeon: Annice Needy, MD;  Location: [facility] INVASIVE CV LAB;  Service: Cardiovascular;  Laterality: N/A;   DIALYSIS/PERMA CATHETER REMOVAL N/A 07/06/2023   Procedure: DIALYSIS/PERMA CATHETER REMOVAL;  Surgeon: Renford Dills, MD;  Location: [facility] INVASIVE CV LAB;  Service: Cardiovascular;  Laterality: N/A;   ESOPHAGOGASTRODUODENOSCOPY (EGD) WITH PROPOFOL N/A 04/04/2018   Procedure: ESOPHAGOGASTRODUODENOSCOPY (EGD) WITH PROPOFOL;  Surgeon: Christena Deem, MD;  Location: [facility] ENDOSCOPY;  Service: Endoscopy;  Laterality: N/A;   HIATAL HERNIA REPAIR  1990   NISSEN FUNDOPLICATION  2021   PERITONEAL CATHETER INSERTION  04/09/2022   PERITONEAL CATHETER REMOVAL  02/2023   ROUX-EN-Y GASTRIC BYPASS  2021   TONSILLECTOMY AND ADENOIDECTOMY     TUBAL LIGATION     UPPER GASTROINTESTINAL ENDOSCOPY     Uroplasty  ~2000's   Dr Lonna Cobb     Social History   Tobacco Use   Smoking status: Never   Smokeless tobacco: Never  Vaping Use   Vaping status: Never Used  Substance Use Topics   Alcohol use: Never    Alcohol/week: 0.0 standard drinks of alcohol   Drug use: Never      Family History  Problem Relation Age of Onset   Cancer Mother        lung cancer   Parkinson's disease Father    Dementia Father    COPD Father    Heart disease Father    Cancer Sister        lung cancer   Cancer Son        Ewing's sarcoma   Breast cancer Neg Hx      Allergies  Allergen Reactions   Cefditoren Pivoxil     Other reaction(s): Other (See Comments) GI issues, severe abdominal pain   Clarithromycin     Other reaction(s): Other (See  Comments) GI issues   Codeine Sulfate Itching   Erythromycin Other (See Comments)    GI issues   Other Other (See Comments)    Oral iron   Pseudoephedrine Other (See Comments)    Altered mental status   Sulfa Antibiotics Itching     REVIEW OF SYSTEMS (Negative unless checked)  Constitutional: [] Weight loss  [] Fever  [] Chills Cardiac: [] Chest pain   [] Chest pressure   [] Palpitations   [] Shortness of breath when laying flat   [] Shortness of breath at rest   [x] Shortness of breath with exertion. Vascular:  [] Pain in legs with walking   [] Pain in legs at rest   [] Pain in legs when laying flat   [] Claudication   [] Pain in feet when walking  [] Pain in feet at rest  [] Pain in feet when laying flat   [] History of DVT   [] Phlebitis   [] Swelling in legs   [] Varicose veins   [] Non-healing ulcers Pulmonary:   [] Uses home oxygen   [] Productive cough   [] Hemoptysis   [] Wheeze  [] COPD   [] Asthma Neurologic:  [] Dizziness  [] Blackouts   [] Seizures   [x] History of stroke   [] History of TIA  [] Aphasia   [] Temporary blindness   [] Dysphagia   [] Weakness or numbness in arms   [] Weakness or numbness in legs Musculoskeletal:  [] Arthritis   [] Joint swelling   [x] Joint pain   [] Low back pain Hematologic:  [] Easy bruising  [] Easy bleeding   [] Hypercoagulable state   [x] Anemic   Gastrointestinal:  [] Blood in stool   [] Vomiting blood  [x] Gastroesophageal reflux/heartburn   [] Abdominal pain Genitourinary:  [x] Chronic kidney disease   [] Difficult urination  [] Frequent urination  [] Burning with urination   [] Hematuria Skin:  [] Rashes   [] Ulcers   [] Wounds Psychological:  [] History of anxiety   [x]  History of major depression.  Physical Examination  BP 110/67 (BP Location: Right Arm)  Pulse 83   Resp 18   Ht 5' (1.524 m)   Wt 130 lb 6.4 oz (59.1 kg)   BMI 25.47 kg/m  Gen:  WD/WN, NAD Head: [place]/AT, No temporalis wasting. Ear/Nose/Throat: Hearing grossly intact, nares w/o erythema or drainage Eyes: Conjunctiva  clear. Sclera non-icteric Neck: Supple.  Trachea midline Pulmonary:  Good air movement, no use of accessory muscles.  Cardiac: RRR, no JVD Vascular: Excellent thrill in left brachiocephalic AV fistula.  Some bruising is present at the access site stick but overall the access incision is well-healed. Vessel Right Left  Radial Palpable Palpable                   Musculoskeletal: M/S 5/5 throughout.  No deformity or atrophy.  Neurologic: Sensation grossly intact in extremities.  Symmetrical.  Speech is fluent.  Psychiatric: Judgment intact, Mood & affect appropriate for pt's clinical situation. Dermatologic: No rashes or ulcers noted.  No cellulitis or open wounds.      Labs Recent Results (from the past 2160 hour(s))  Potassium [facility] vascular lab only)     Status: None   Collection Time: 08/16/23  9:33 AM  Result Value Ref Range   Potassium [facility] vascular lab) 4.1 3.5 - 5.1 mmol/L    Comment: Performed at [facility], [address] [place], [place] [address]    Radiology PERIPHERAL VASCULAR CATHETERIZATION  Result Date: 08/16/2023 See surgical note for result.   Assessment/Plan  Type 2 diabetes mellitus with neurological manifestations, controlled (HCC) blood glucose control important in reducing the progression of atherosclerotic disease. Also, involved in wound healing. On appropriate medications.   HTN (hypertension) blood pressure control important in reducing the progression of atherosclerotic disease. On appropriate oral medications.   ESRD (end stage renal disease) (HCC) Left brachiocephalic AV fistula working well for dialysis after intervention.  Continue to use but rotating the access site will be of paramount importance of keeping this fistula working for years to come.  I will plan to see her back in 3 months with follow-up duplex of the AV fistula.    Festus Barren, MD  09/14/2023 4:36 PM    This note was created with Dragon medical  transcription system.  Any errors from dictation are purely unintentional

## 2023-09-14 NOTE — Assessment & Plan Note (Signed)
Left brachiocephalic AV fistula working well for dialysis after intervention.  Continue to use but rotating the access site will be of paramount importance of keeping this fistula working for years to come.  I will plan to see her back in 3 months with follow-up duplex of the AV fistula.

## 2023-09-14 NOTE — Assessment & Plan Note (Signed)
blood pressure control important in reducing the progression of atherosclerotic disease. On appropriate oral medications.  

## 2023-09-25 DIAGNOSIS — N186 End stage renal disease: Secondary | ICD-10-CM | POA: Diagnosis not present

## 2023-09-25 DIAGNOSIS — Z992 Dependence on renal dialysis: Secondary | ICD-10-CM | POA: Diagnosis not present

## 2023-09-26 DIAGNOSIS — Z23 Encounter for immunization: Secondary | ICD-10-CM | POA: Diagnosis not present

## 2023-09-26 DIAGNOSIS — D509 Iron deficiency anemia, unspecified: Secondary | ICD-10-CM | POA: Diagnosis not present

## 2023-09-26 DIAGNOSIS — D631 Anemia in chronic kidney disease: Secondary | ICD-10-CM | POA: Diagnosis not present

## 2023-09-26 DIAGNOSIS — N186 End stage renal disease: Secondary | ICD-10-CM | POA: Diagnosis not present

## 2023-09-26 DIAGNOSIS — Z992 Dependence on renal dialysis: Secondary | ICD-10-CM | POA: Diagnosis not present

## 2023-09-27 ENCOUNTER — Ambulatory Visit (INDEPENDENT_AMBULATORY_CARE_PROVIDER_SITE_OTHER): Payer: PPO | Admitting: Internal Medicine

## 2023-09-27 VITALS — BP 141/75 | HR 86 | Resp 16 | Ht 60.0 in | Wt 123.0 lb

## 2023-09-27 DIAGNOSIS — I10 Essential (primary) hypertension: Secondary | ICD-10-CM | POA: Diagnosis not present

## 2023-09-27 DIAGNOSIS — G2581 Restless legs syndrome: Secondary | ICD-10-CM

## 2023-09-27 DIAGNOSIS — G4733 Obstructive sleep apnea (adult) (pediatric): Secondary | ICD-10-CM | POA: Diagnosis not present

## 2023-09-27 NOTE — Progress Notes (Signed)
Sleep Medicine   Office Visit  Patient Name: Anita Greene DOB: 1951/06/11 MRN [id number]    Chief Complaint: history of OSA   Brief History:  Shereta presents for an initial consult for sleep evaluation and to establish care. Patient has a longstanding history of sleep apnea and used to use a PAP device. However, patient reports having bariatric surgery and losing significant amount of weight and stopped using her PAP 2 years ago. Sleep quality is poor. This is noted every night. The patient's bed partner reports snoring at night. The patient relates the following symptoms: fatigue, headaches, trouble concentrating and brain fogginess are also present. The patient goes to sleep at midnight and wakes up at 0900 am and will wake up at least once in between to use the restroom. Sleep quality is worse when outside home environment.  Patient has noted some movement of her legs at night that would disrupt her sleep. The patient reports history of restless leg syndrome. The patient  relates some vivid dreams as unusual behavior during the night.  The patient depression and anxiety as a history of psychiatric problems. The Epworth Sleepiness Score is 11 out of 24 .  The patient relates  Cardiovascular risk factors include: hypertension, cva.     ROS  General: (-) fever, (-) chills, (-) night sweat Nose and Sinuses: (-) nasal stuffiness or itchiness, (-) postnasal drip, (-) nosebleeds, (-) sinus trouble. Mouth and Throat: (-) sore throat, (-) hoarseness. Neck: (-) swollen glands, (-) enlarged thyroid, (-) neck pain. Respiratory: + cough, + shortness of breath, - wheezing. Neurologic: -+ numbness, + tingling. Psychiatric: + anxiety, + depression Sleep behavior: -sleep paralysis -hypnogogic hallucinations -dream enactment      -vivid dreams -cataplexy -night terrors -sleep walking   Current Medication: Outpatient Encounter Medications as of 09/27/2023  Medication Sig Note   aspirin EC 81 MG  tablet Take 81 mg by mouth at bedtime.    buPROPion (WELLBUTRIN XL) 150 MG 24 hr tablet Take 150 mg by mouth at bedtime.    busPIRone (BUSPAR) 15 MG tablet Take 15 mg by mouth 3 (three) times daily.    calcium acetate (PHOSLO) 667 MG tablet Take 1,334 mg by mouth 3 (three) times daily.    carvedilol (COREG) 6.25 MG tablet Take 6.25 mg by mouth 2 (two) times daily with a meal.    cetirizine (ZYRTEC) 10 MG tablet Take 1 tablet (10 mg total) by mouth daily. (Patient taking differently: Take 10 mg by mouth at bedtime.)    cholecalciferol (VITAMIN D3) 10 MCG (400 UNIT) TABS tablet Take 2,000 Units by mouth daily.    diphenoxylate-atropine (LOMOTIL) 2.5-0.025 MG tablet TAKE 1 TABLET BY MOUTH 4 TIMES DAILY AS NEEDED FOR DIARRHEA OR LOOSE STOOLS. 06/03/2023: Pt states, "it's been a while, more than a month."   DULoxetine (CYMBALTA) 30 MG capsule Take 1 capsule (30 mg total) by mouth daily. (Patient taking differently: Take 30 mg by mouth at bedtime.)    famotidine (PEPCID) 20 MG tablet Take 20 mg by mouth daily.    furosemide (LASIX) 40 MG tablet Take 40 mg by mouth daily. T-[id number]-SUN    gabapentin (NEURONTIN) 100 MG capsule Take 100 mg by mouth at bedtime.    HYDROcodone-acetaminophen (NORCO/VICODIN) 5-325 MG tablet Take 2 tablets by mouth every 6 (six) hours as needed for moderate pain. (Patient not taking: Reported on 06/03/2023)    Lifitegrast (XIIDRA) 5 % SOLN Apply to eye. (Patient not taking: Reported on 09/14/2023) 08/12/2023: Haven't picked  up yet    magnesium gluconate (MAGONATE) 500 MG tablet Take 500 mg by mouth daily.    Magnesium Oxide -Mg Supplement 500 MG TABS Take by mouth. (Patient not taking: Reported on 08/16/2023)    midodrine (PROAMATINE) 10 MG tablet Take 10 mg by mouth 3 (three) times daily as needed.    Multiple Vitamins-Minerals (ICAPS) TABS Take 1 tablet by mouth daily.    multivitamin (RENA-VIT) TABS tablet Take 1 tablet by mouth at bedtime.    ondansetron (ZOFRAN) 4 MG tablet  Take by mouth as needed.    potassium chloride SA (KLOR-CON M) 20 MEQ tablet Take 2 tablets (40 mEq) today, then 1 tablet (20 mEq) BID tomorrow, then 1 tablet (20 mEq) on the day of surgery. Follow up with PCP/nephrology for repeat labs. (Patient not taking: Reported on 07/06/2023)    sennosides-docusate sodium (SENOKOT-S) 8.6-50 MG tablet Take 1 tablet by mouth daily as needed. Do not take this med within 2 hours of other meds (Patient not taking: Reported on 08/16/2023)    spironolactone (ALDACTONE) 25 MG tablet Take 25 mg by mouth daily.    valsartan (DIOVAN) 160 MG tablet Take 160 mg by mouth daily.    verapamil (CALAN) 40 MG tablet Take 20 mg by mouth as directed. Tuesday, Wednesday, Thursday,Saturday,Sunday    vitamin B-12 (CYANOCOBALAMIN) 500 MCG tablet Take 500 mcg by mouth daily.    zolpidem (AMBIEN) 5 MG tablet Take 5 mg by mouth at bedtime as needed for sleep.    Facility-Administered Encounter Medications as of 09/27/2023  Medication   lidocaine-EPINEPHrine (PF) (XYLOCAINE-EPINEPHrine) 1 %-1:200000 (PF) injection    Surgical History: Past Surgical History:  Procedure Laterality Date   A/V FISTULAGRAM Left 06/03/2023   Procedure: A/V Fistulagram;  Surgeon: Annice Needy, MD;  Location: [facility] INVASIVE CV LAB;  Service: Cardiovascular;  Laterality: Left;   A/V FISTULAGRAM Left 08/16/2023   Procedure: A/V Fistulagram;  Surgeon: Annice Needy, MD;  Location: [facility] CV [facility];  Service: Cardiovascular;  Laterality: Left;   AV FISTULA PLACEMENT Left 04/14/2023   Procedure: ARTERIOVENOUS (AV) FISTULA CREATION;  Surgeon: Annice Needy, MD;  Location: [facility] ORS;  Service: Vascular;  Laterality: Left;   CATARACT EXTRACTION W/ INTRAOCULAR LENS  IMPLANT, BILATERAL     CHOLECYSTECTOMY     COLONOSCOPY     COLONOSCOPY WITH PROPOFOL N/A 04/04/2018   Procedure: COLONOSCOPY WITH PROPOFOL;  Surgeon: Christena Deem, MD;  Location: [facility] ENDOSCOPY;  Service: Endoscopy;  Laterality: N/A;    DIALYSIS/PERMA CATHETER INSERTION N/A 01/23/2022   Procedure: DIALYSIS/PERMA CATHETER INSERTION;  Surgeon: Annice Needy, MD;  Location: [facility] CV [facility];  Service: Cardiovascular;  Laterality: N/A;   DIALYSIS/PERMA CATHETER INSERTION N/A 01/28/2023   Procedure: DIALYSIS/PERMA CATHETER INSERTION;  Surgeon: Annice Needy, MD;  Location: [facility];  Service: Cardiovascular;  Laterality: N/A;   DIALYSIS/PERMA CATHETER REMOVAL N/A 06/15/2022   Procedure: DIALYSIS/PERMA CATHETER REMOVAL;  Surgeon: Annice Needy, MD;  Location: [facility];  Service: Cardiovascular;  Laterality: N/A;   DIALYSIS/PERMA CATHETER REMOVAL N/A 07/06/2023   Procedure: DIALYSIS/PERMA CATHETER REMOVAL;  Surgeon: Renford Dills, MD;  Location: [facility] CV [facility];  Service: Cardiovascular;  Laterality: N/A;   ESOPHAGOGASTRODUODENOSCOPY (EGD) WITH PROPOFOL N/A 04/04/2018   Procedure: ESOPHAGOGASTRODUODENOSCOPY (EGD) WITH PROPOFOL;  Surgeon: Christena Deem, MD;  Location: [facility] ENDOSCOPY;  Service: Endoscopy;  Laterality: N/A;   HIATAL HERNIA REPAIR  1990   NISSEN FUNDOPLICATION  2021   PERITONEAL  CATHETER INSERTION  04/09/2022   PERITONEAL CATHETER REMOVAL  02/2023   ROUX-EN-Y GASTRIC BYPASS  2021   TONSILLECTOMY AND ADENOIDECTOMY     TUBAL LIGATION     UPPER GASTROINTESTINAL ENDOSCOPY     Uroplasty  ~2000's   Dr Lonna Cobb    Medical History: Past Medical History:  Diagnosis Date   Allergic rhinitis due to pollen    Arthritis    B12 deficiency    Cataract cortical, senile    Chronic kidney disease    Colon polyp    Depression    End stage chronic kidney disease (HCC)    Fibromyalgia    GERD (gastroesophageal reflux disease)    Hemiparesis affecting right side as late effect of cerebrovascular accident (CVA) (HCC)    Hyperlipidemia    Hypertension    IBS (irritable bowel syndrome)    diarrhea prone   Iron deficiency anemia    Irritable bowel syndrome with diarrhea    Macular  degeneration of left eye    Migraine headache    Mood disorder (HCC)    Obstructive sleep apnea    Optic atrophy of right eye    Osteoporosis    Paraesophageal hernia    PFO (patent foramen ovale)    Pneumonia    RLS (restless legs syndrome)    Secondary hyperparathyroidism, renal (HCC)    Sleep disturbance    Stroke (HCC)    Type 2 diabetes mellitus with neurological manifestations, controlled (HCC)     Family History: Non contributory to the present illness  Social History: Social History   Socioeconomic History   Marital status: Widowed    Spouse name: Not on file   Number of children: 2   Years of education: Not on file   Highest education level: Not on file  Occupational History   Occupation: [employer]  Tobacco Use   Smoking status: Never   Smokeless tobacco: Never  Vaping Use   Vaping status: Never Used  Substance and Sexual Activity   Alcohol use: Never    Alcohol/week: 0.0 standard drinks of alcohol   Drug use: Never   Sexual activity: Not on file  Other Topics Concern   Not on file  Social History Narrative   1 son died    1 son living--2 grandchildren      Had living will--not sure about it   No formal health care POA--would want son   Would accept resuscitation   No prolonged tube feeds if cognitively unaware   Social Determinants of Health   Financial Resource Strain: Not on file  Food Insecurity: Not on file  Transportation Needs: Not on file  Physical Activity: Not on file  Stress: No Stress Concern Present (01/13/2023)   Received from [facility], [facility]   [employer] [facility] Occupational Health - Occupational Stress Questionnaire    Feeling of Stress : Not at all  Social Connections: Unknown (12/31/2022)   Received from [facility], [facility]   Social Network    Social Network: Not on file  Intimate Partner Violence: Unknown (12/31/2022)   Received from [facility], [facility]   HITS    Physically Hurt: Not on  file    Insult or Talk Down To: Not on file    Threaten Physical Harm: Not on file    Scream or Curse: Not on file    Vital Signs: Blood pressure (!) 141/75, pulse 86, resp. rate 16, height 5' (1.524 m), weight 123 lb (55.8  kg), SpO2 98%. Body mass index is 24.02 kg/m.   Examination: General Appearance: The patient is well-developed, well-nourished, and in no distress. Neck Circumference: 35 cm Skin: Gross inspection of skin unremarkable. Head: normocephalic, no gross deformities. Eyes: no gross deformities noted. ENT: ears appear grossly normal Neurologic: Alert and oriented. No involuntary movements.    STOP BANG RISK ASSESSMENT S (snore) Have you been told that you snore?     YES   T (tired) Are you often tired, fatigued, or sleepy during the day?   YES  O (obstruction) Do you stop breathing, choke, or gasp during sleep? NO   P (pressure) Do you have or are you being treated for high blood pressure? YES   B (BMI) Is your body index greater than 35 kg/m? NO   A (age) Are you 72 years old or older? NO   N (neck) Do you have a neck circumference greater than 16 inches?   NO   G (gender) Are you a female? YES   TOTAL STOP/BANG "YES" ANSWERS 4                                                               A STOP-Bang score of 2 or less is considered low risk, and a score of 5 or more is high risk for having either moderate or severe OSA. For people who score 3 or 4, doctors may need to perform further assessment to determine how likely they are to have OSA.         EPWORTH SLEEPINESS SCALE:  Scale:  (0)= no chance of dozing; (1)= slight chance of dozing; (2)= moderate chance of dozing; (3)= high chance of dozing  Chance  Situtation    Sitting and reading: 2    Watching TV: 2    Sitting Inactive in public: 1    As a passenger in car: 2      Lying down to rest: 3    Sitting and talking: 0    Sitting quielty after lunch: 1    In a car, stopped in traffic:  0   TOTAL SCORE:   11 out of 24    SLEEP STUDIES:  Not available   LABS: Recent Results (from the past 2160 hour(s))  Potassium [facility] vascular lab only)     Status: None   Collection Time: 08/16/23  9:33 AM  Result Value Ref Range   Potassium [facility] vascular lab) 4.1 3.5 - 5.1 mmol/L    Comment: Performed at [facility], [address] [place], [place] [address]    Radiology: PERIPHERAL VASCULAR CATHETERIZATION  Result Date: 08/16/2023 See surgical note for result.   No results found.  No results found.    Assessment and Plan: Patient Active Problem List   Diagnosis Date Noted   OSA (obstructive sleep apnea) 09/27/2023   Type 2 diabetes mellitus without complication, without long-term current use of insulin (HCC) 03/31/2022   Dependence on renal dialysis (HCC) 01/30/2022   Allergic rhinitis, unspecified 01/30/2022   Hemiplegia and hemiparesis following cerebral infarction affecting right dominant side (HCC) 01/30/2022   Restless legs syndrome 01/30/2022   Patent foramen ovale 01/30/2022   ESRD (end stage renal disease) (HCC)    Physical deconditioning 01/22/2022   History of CVA (  cerebrovascular accident) 01/22/2022   Hypomagnesemia 01/22/2022   HTN (hypertension) 01/22/2022   Anemia of chronic disease 01/22/2022   Acute renal failure superimposed on stage 3b chronic kidney disease (HCC) 01/21/2022   Bariatric surgery status 04/14/2019   Essential hypertension 03/17/2019   Myalgia 03/26/2017   Chronic kidney disease due to type 2 diabetes mellitus (HCC) 03/26/2017   Iron deficiency anemia 03/26/2017   Advance directive discussed with patient 12/15/2016   Preventative health care 06/19/2016   Type 2 diabetes mellitus with neurological manifestations, controlled (HCC)    Fibromyalgia    Hemiparesis affecting right side as late effect of cerebrovascular accident (CVA) (HCC)    Migraine headache    IBS (irritable bowel syndrome)    Mood disorder  (HCC)    Obstructive sleep apnea    Sleep disturbance    GERD (gastroesophageal reflux disease)    Irritable bowel syndrome with diarrhea 09/18/2014   1. OSA (obstructive sleep apnea) PLAN OSA:   Patient evaluation suggests high risk of sleep disordered breathing due to history of OSA in past, fatigue, headaches, brain fog, daytime sleepiness.  Patient has comorbid cardiovascular risk factors including: hypertension, stroke  which could be exacerbated by pathologic sleep-disordered breathing.  Suggest: PSG to assess/treat the patient's sleep disordered breathing.  2. Essential hypertension Hypertension Counseling:   The following hypertensive lifestyle modification were recommended and discussed:  1. Limiting alcohol intake to less than 1 oz/day of ethanol:(24 oz of beer or 8 oz of wine or 2 oz of 100-proof whiskey). 2. Take baby ASA 81 mg daily. 3. Importance of regular aerobic exercise and losing weight. 4. Reduce dietary saturated fat and cholesterol intake for overall cardiovascular health. 5. Maintaining adequate dietary potassium, calcium, and magnesium intake. 6. Regular monitoring of the blood pressure. 7. Reduce sodium intake to less than 100 mmol/day (less than 2.3 gm of sodium or less than 6 gm of sodium choride)    3. Restless legs syndrome On gabapentin and doing well. Continue.    General Counseling: I have discussed the findings of the evaluation and examination with Liborio Nixon.  I have also discussed any further diagnostic evaluation thatmay be needed or ordered today. Ellagrace verbalizes understanding of the findings of todays visit. We also reviewed her medications today and discussed drug interactions and side effects including but not limited excessive drowsiness and altered mental states. We also discussed that there is always a risk not just to her but also people around her. she has been encouraged to call the office with any questions or concerns that should arise related  to todays visit.  No orders of the defined types were placed in this encounter.       I have personally obtained a history, evaluated the patient, evaluated pertinent data, formulated the assessment and plan and placed orders.   This patient was seen today by Emmaline Kluver, PA-C in collaboration with Dr. Freda Munro.   Yevonne Pax, MD [facility] Diplomate ABMS Pulmonary and Critical Care Medicine Sleep medicine

## 2023-10-14 DIAGNOSIS — H02889 Meibomian gland dysfunction of unspecified eye, unspecified eyelid: Secondary | ICD-10-CM | POA: Diagnosis not present

## 2023-10-14 DIAGNOSIS — E119 Type 2 diabetes mellitus without complications: Secondary | ICD-10-CM | POA: Diagnosis not present

## 2023-10-14 DIAGNOSIS — D3131 Benign neoplasm of right choroid: Secondary | ICD-10-CM | POA: Diagnosis not present

## 2023-10-14 DIAGNOSIS — H4423 Degenerative myopia, bilateral: Secondary | ICD-10-CM | POA: Diagnosis not present

## 2023-10-26 DIAGNOSIS — N186 End stage renal disease: Secondary | ICD-10-CM | POA: Diagnosis not present

## 2023-10-26 DIAGNOSIS — Z992 Dependence on renal dialysis: Secondary | ICD-10-CM | POA: Diagnosis not present

## 2023-10-27 DIAGNOSIS — D509 Iron deficiency anemia, unspecified: Secondary | ICD-10-CM | POA: Diagnosis not present

## 2023-10-27 DIAGNOSIS — D631 Anemia in chronic kidney disease: Secondary | ICD-10-CM | POA: Diagnosis not present

## 2023-10-27 DIAGNOSIS — N186 End stage renal disease: Secondary | ICD-10-CM | POA: Diagnosis not present

## 2023-10-27 DIAGNOSIS — Z992 Dependence on renal dialysis: Secondary | ICD-10-CM | POA: Diagnosis not present

## 2023-11-10 ENCOUNTER — Encounter (INDEPENDENT_AMBULATORY_CARE_PROVIDER_SITE_OTHER): Payer: PPO | Admitting: Internal Medicine

## 2023-11-10 DIAGNOSIS — G4719 Other hypersomnia: Secondary | ICD-10-CM

## 2023-11-10 DIAGNOSIS — G4733 Obstructive sleep apnea (adult) (pediatric): Secondary | ICD-10-CM | POA: Diagnosis not present

## 2023-11-17 NOTE — Procedures (Signed)
[facility]  Polysomnogram Report Part I                                                               Phone: [phone] Fax: [phone]  Patient Name: Anita Greene, Anita Greene. Acquisition Number: [id number]  Date of Birth: Sep 30, 1951 Acquisition Date: 11/10/2023  Referring Physician: Yevonne Pax, MD     History: The patient is a 73 year old  who was referred for evaluation of . Medical History: allergic rhinitis, arthritis, B12, cataract cortical(senile), chronic kidney disease, colon polyp, depression, fibromyalgia, GERD, CVA (right side), hyperlipidemia, hypertension, IBS, anemia, macular degeneration (left eye), migraine, mood disorder, optic atrophy( right eye), osteoporosis, paraesophageal hernia, patent foremen ovale, pneumonia, RLS, secondary hyperparathyroidism (renal), type II diabetes mellitis w/ neurological manifestations.  Medications: aspirin, Wellbutrin, Buspar, phoslo, carvedilol, Zyrtec, vitamin D3, Lomotil, Cymbalta, Pepcid, Lasix, Neurontin, Norco/vicodin, Xiidra, magonate, Proamatine, icaps, Rena-vit, Zofran, Klor-Con, Senokot, aldactone, Diovan, Calan, cyanocobalamin, Ambien.  Procedure: This routine overnight polysomnogram was performed on the [facility] 5 using the standard diagnostic protocol. This included 6 channels of EEG, 2 channels of EOG, chin EMG, bilateral anterior tibialis EMG, nasal/oral thermistor, PTAF (nasal pressure transducer), chest and abdominal wall movements, EKG, and pulse oximetry.  Description: The total recording time was 392.5 minutes. The total sleep time was 378.5 minutes. There were a total of 4.0 minutes of wakefulness after sleep onset for a very goodsleep efficiency of 96.4%. The latency to sleep onset was within normal limitsat 10.0 minutes. The R sleep onset latency was N/A.  Sleep parameters, as a percentage of the total sleep time, demonstrated 1.5% of sleep was in N1 sleep, 79.1% N2, 19.4% N3 and 0.0% R sleep. There were a total of 109  arousals for an arousal index of 17.3 arousals per hour of sleep that was elevated.  Respiratory monitoring demonstrated   snoring . There were 149 apneas and hypopneas for an Apnea Hypopnea Index of 23.6 apneas and hypopneas per hour of sleep.   The average duration of the respiratory events was 21.2 seconds with a maximum duration of 36.0 seconds. The respiratory events occurred mostly in the supine position with an AHI of 45.3. The respiratory events were associated with peripheral oxygen desaturations on the average to 90%. The lowest oxygen desaturation associated with a respiratory event was 82%. Additionally, the baseline oxygen saturation during wakefulness was 96%, during NREM sleep averaged 96%, and during REM sleep averaged N/A. The total duration of oxygen < 90% was 26.6 minutes and <80% was 0.3 minutes.  Cardiac monitoring-  demonstrate transient cardiac decelerations associated with the apneas.  significant cardiac rhythm irregularities.   Periodic limb movement monitoring- demonstrated that there were 216 periodic limb movements for a periodic limb movement index of 34.2 periodic limb movements per hour of sleep.   Impression: This routine overnight polysomnogram demonstrated significant, position-dependent obstructive sleep apnea with an overall Apnea Hypopnea Index of 23.6 apneas and hypopneas per hour of sleep. The respiratory events occurred almost exclusively in the supine position with an AHI of 45.3. The lowest desaturation was to 82%.  REM sleep was not observed and this may have caused an underestimation of the severity of sleep apnea.  There was a significantly elevated periodic limb movement index of 34.2 periodic limb  movements per hour of sleep. The patient has a history of restless leg syndrome. Sometimes these limb movements subside once the apnea is controlled Clinical correlation is suggested.  There was anelevated arousal index with no REM sleep. These findings would  appear to be due to the combination of obstructive sleep apnea and periodic limb movements.   Recommendations:    A CPAP titration would be recommended due to the severity of the sleep apnea. Some supine sleep should be ensured to optimize the titration.   Yevonne Pax, MD, [facility] Diplomate ABMS-Pulmonary, Critical Care and Sleep Medicine  Electronically reviewed and digitally signed  [facility] Polysomnogram Report Part II  Phone: [phone] Fax: [phone]  Patient last name Asante Neck Size 13.5 in. Acquisition [id number]  Patient first name Anita Excell. Weight 123.0 lbs. Started 11/10/2023 at 11:07:45 PM  Birth date [birth year] Height 60.0 in. Stopped 11/11/2023 at 5:56:27 AM  Age 35 BMI 24.0 lb/in2 Duration 392.5  Study Type Adult      Robbi Garter ( RPSGT) & Ja'Net Rennis Harding   Reviewed by: Valentino Hue. Henke, PhD, ABSM, FAASM Sleep Data: Lights Out: 11:16:15 PM Sleep Onset: 11:26:15 PM  Lights On: 5:48:45 AM Sleep Efficiency: 96.4 %  Total Recording Time: 392.5 min Sleep Latency (from Lights Off) 10.0 min  Total Sleep Time (TST): 378.5 min R Latency (from Sleep Onset): N/A  Sleep Period Time: 382.0 min Total number of awakenings: 7  Wake during sleep: 3.5 min Wake After Sleep Onset (WASO): 4.0 min   Sleep Data:         Arousal Summary: Stage  Latency from lights out (min) Latency from sleep onset (min) Duration (min) % Total Sleep Time  Normal values  N 1 10.0 0.0 5.5 1.5 (5%)  N 2 11.5 1.5 299.5 79.1 (50%)  N 3 18.5 8.5 73.5 19.4 (20%)  R N/A N/A 0.0 0.0 (25%)   Number Index  Spontaneous 34 5.4  Apneas & Hypopneas 64 10.1  RERAs 0 0.0       (Apneas & Hypopneas & RERAs)  (64) (10.1)  Limb Movement 12 1.9  Snore 0 0.0  TOTAL 110 17.4     Respiratory Data:  CA OA MA Apnea Hypopnea* A+ H RERA Total  Number 0 134 0 134 15 149 0 149  Mean Dur (sec) 0.0 21.5 0.0 21.5 18.1 21.2 0.0 21.2  Max Dur (sec) 0.0 36.0 0.0 36.0 34.0 36.0 0.0 36.0  Total Dur (min) 0.0  48.0 0.0 48.0 4.5 52.6 0.0 52.6  % of TST 0.0 12.7 0.0 12.7 1.2 13.9 0.0 13.9  Index (#/h TST) 0.0 21.2 0.0 21.2 2.4 23.6 0.0 23.6  *Hypopneas scored based on 4% or greater desaturation.  Sleep Stage:        REM NREM TST  AHI N/A 23.6 23.6  RDI N/A 23.6 23.6           Body Position Data:  Sleep (min) TST (%) REM (min) NREM (min) CA (#) OA (#) MA (#) HYP (#) AHI (#/h) RERA (#) RDI (#/h) Desat (#)  Supine 184.1 48.64 0.0 184.1 0 127 0 12 45.3 0 45.3 139  Non-Supine 194.40 51.36 0.00 194.40 0.00 7.00 0.00 3.00 3.09 0 3.09 16.00  Left: 194.4 51.36 0.0 194.4 0 7 0 3 3.1 0 3.1 16     Snoring: Total number of snoring episodes  0  Total time with snoring    min (   % of sleep)  Oximetry Distribution:             WK REM NREM TOTAL  Average (%)   96    96 96  < 90% 0.1 0.0 26.5 26.6  < 80% 0.0 0.0 0.3 0.3  < 70% 0.0 0.0 0.0 0.0  # of Desaturations* 4 0 151 155  Desat Index (#/hour) 24.7    24.1 24.7  Desat Max (%) 8 0 12 12  Desat Max Dur (sec) 17.0 0.0 84.0 84.0  Approx Min O2 during sleep 67  Approx min O2 during a respiratory event 82  Was Oxygen added (Y/N) and final rate :    LPM  *Desaturations based on 4% or greater drop from baseline.   Cheyne Stokes Breathing: None Present   Heart Rate Summary:  Average Heart Rate During Sleep 71.9 bpm      Highest Heart Rate During Sleep (95th %) 77.0 bpm      Highest Heart Rate During Sleep 215 bpm (artifact)  Highest Heart Rate During Recording (TIB) 235 bpm (artifact)   Heart Rate Observations: Event Type # Events   Bradycardia 0 Lowest HR Scored: N/A  Sinus Tachycardia During Sleep 0 Highest HR Scored: N/A  Narrow Complex Tachycardia 0 Highest HR Scored: N/A  Wide Complex Tachycardia 0 Highest HR Scored: N/A  Asystole 0 Longest Pause: N/A  Atrial Fibrillation 0 Duration Longest Event: N/A  Other Arrythmias   Type:    Periodic Limb Movement Data: (Primary legs unless otherwise noted) Total # Limb Movement  244 Limb Movement Index 38.7  Total # PLMS 216 PLMS Index 34.2  Total # PLMS Arousals 7 PLMS Arousal Index 1.1  Percentage Sleep Time with PLMS 105.61min (27.8 % sleep)  Mean Duration limb movements (secs) 789.2

## 2023-11-26 DIAGNOSIS — Z992 Dependence on renal dialysis: Secondary | ICD-10-CM | POA: Diagnosis not present

## 2023-11-26 DIAGNOSIS — N186 End stage renal disease: Secondary | ICD-10-CM | POA: Diagnosis not present

## 2023-11-27 DIAGNOSIS — D631 Anemia in chronic kidney disease: Secondary | ICD-10-CM | POA: Diagnosis not present

## 2023-11-27 DIAGNOSIS — N186 End stage renal disease: Secondary | ICD-10-CM | POA: Diagnosis not present

## 2023-11-27 DIAGNOSIS — Z992 Dependence on renal dialysis: Secondary | ICD-10-CM | POA: Diagnosis not present

## 2023-11-27 DIAGNOSIS — D509 Iron deficiency anemia, unspecified: Secondary | ICD-10-CM | POA: Diagnosis not present

## 2023-12-24 DIAGNOSIS — N186 End stage renal disease: Secondary | ICD-10-CM | POA: Diagnosis not present

## 2023-12-24 DIAGNOSIS — Z992 Dependence on renal dialysis: Secondary | ICD-10-CM | POA: Diagnosis not present

## 2023-12-25 DIAGNOSIS — N186 End stage renal disease: Secondary | ICD-10-CM | POA: Diagnosis not present

## 2023-12-25 DIAGNOSIS — Z992 Dependence on renal dialysis: Secondary | ICD-10-CM | POA: Diagnosis not present

## 2023-12-25 DIAGNOSIS — D631 Anemia in chronic kidney disease: Secondary | ICD-10-CM | POA: Diagnosis not present

## 2024-01-03 ENCOUNTER — Encounter (INDEPENDENT_AMBULATORY_CARE_PROVIDER_SITE_OTHER): Payer: PPO

## 2024-01-03 ENCOUNTER — Ambulatory Visit (INDEPENDENT_AMBULATORY_CARE_PROVIDER_SITE_OTHER): Payer: PPO | Admitting: Nurse Practitioner

## 2024-01-21 DIAGNOSIS — G4733 Obstructive sleep apnea (adult) (pediatric): Secondary | ICD-10-CM | POA: Diagnosis not present

## 2024-01-24 DIAGNOSIS — N186 End stage renal disease: Secondary | ICD-10-CM | POA: Diagnosis not present

## 2024-01-24 DIAGNOSIS — Z992 Dependence on renal dialysis: Secondary | ICD-10-CM | POA: Diagnosis not present

## 2024-01-25 DIAGNOSIS — N186 End stage renal disease: Secondary | ICD-10-CM | POA: Diagnosis not present

## 2024-01-25 DIAGNOSIS — Z992 Dependence on renal dialysis: Secondary | ICD-10-CM | POA: Diagnosis not present

## 2024-01-25 DIAGNOSIS — D631 Anemia in chronic kidney disease: Secondary | ICD-10-CM | POA: Diagnosis not present

## 2024-02-17 ENCOUNTER — Ambulatory Visit (INDEPENDENT_AMBULATORY_CARE_PROVIDER_SITE_OTHER): Admitting: Nurse Practitioner

## 2024-02-17 ENCOUNTER — Other Ambulatory Visit: Payer: Self-pay | Admitting: Family Medicine

## 2024-02-17 ENCOUNTER — Encounter (INDEPENDENT_AMBULATORY_CARE_PROVIDER_SITE_OTHER)

## 2024-02-17 DIAGNOSIS — Z1231 Encounter for screening mammogram for malignant neoplasm of breast: Secondary | ICD-10-CM

## 2024-02-21 DIAGNOSIS — G4733 Obstructive sleep apnea (adult) (pediatric): Secondary | ICD-10-CM | POA: Diagnosis not present

## 2024-02-23 ENCOUNTER — Ambulatory Visit (INDEPENDENT_AMBULATORY_CARE_PROVIDER_SITE_OTHER): Admitting: Nurse Practitioner

## 2024-02-23 ENCOUNTER — Ambulatory Visit (INDEPENDENT_AMBULATORY_CARE_PROVIDER_SITE_OTHER)

## 2024-02-23 ENCOUNTER — Encounter (INDEPENDENT_AMBULATORY_CARE_PROVIDER_SITE_OTHER): Payer: Self-pay | Admitting: Nurse Practitioner

## 2024-02-23 VITALS — BP 108/67 | HR 69 | Resp 17 | Ht 60.0 in | Wt 136.6 lb

## 2024-02-23 DIAGNOSIS — I10 Essential (primary) hypertension: Secondary | ICD-10-CM

## 2024-02-23 DIAGNOSIS — N186 End stage renal disease: Secondary | ICD-10-CM

## 2024-02-23 DIAGNOSIS — T82858A Stenosis of vascular prosthetic devices, implants and grafts, initial encounter: Secondary | ICD-10-CM

## 2024-02-23 DIAGNOSIS — E1149 Type 2 diabetes mellitus with other diabetic neurological complication: Secondary | ICD-10-CM

## 2024-02-23 DIAGNOSIS — Z992 Dependence on renal dialysis: Secondary | ICD-10-CM | POA: Diagnosis not present

## 2024-02-23 NOTE — Progress Notes (Signed)
 Subjective:    Patient ID: Anita Greene, female    DOB: 10/03/1951, 73 y.o.   MRN: [id number] Chief Complaint  Patient presents with   Venous Insufficiency    The patient returns today for follow-up regarding her dialysis access.  She notes that the previous neuropathic arm pain she had are improved not completely resolved but they are much better than they were.  She does have occasional pain into that arm after dialysis.  She notes that her dialysis access is functioning fairly well with minimal issues.  Today she has several areas of stenosis in the distal upper arm as well as in the shoulder area.  Today she has a flow volume of 1285 which she is improved from the previous flow volume of 687.    Review of Systems  Hematological:  Bruises/bleeds easily.  All other systems reviewed and are negative.      Objective:   Physical Exam Vitals reviewed.  HENT:     Head: Normocephalic.  Cardiovascular:     Rate and Rhythm: Normal rate.     Pulses:          Radial pulses are 2+ on the left side.     Arteriovenous access: Left arteriovenous access is present. Pulmonary:     Effort: Pulmonary effort is normal.  Skin:    General: Skin is warm and dry.  Neurological:     Mental Status: She is alert and oriented to person, place, and time.  Psychiatric:        Mood and Affect: Mood normal.        Behavior: Behavior normal.        Thought Content: Thought content normal.        Judgment: Judgment normal.    BP 108/67 (BP Location: Right Arm, Patient Position: Sitting, Cuff Size: Normal)   Pulse 69   Resp 17   Ht 5' (1.524 m)   Wt 136 lb 9.6 oz (62 kg)   BMI 26.68 kg/m   Past Medical History:  Diagnosis Date   Allergic rhinitis due to pollen    Arthritis    B12 deficiency    Cataract cortical, senile    Chronic kidney disease    Colon polyp    Depression    End stage chronic kidney disease (HCC)    Fibromyalgia    GERD (gastroesophageal reflux disease)     Hemiparesis affecting right side as late effect of cerebrovascular accident (CVA) (HCC)    Hyperlipidemia    Hypertension    IBS (irritable bowel syndrome)    diarrhea prone   Iron deficiency anemia    Irritable bowel syndrome with diarrhea    Macular degeneration of left eye    Migraine headache    Mood disorder (HCC)    Obstructive sleep apnea    Optic atrophy of right eye    Osteoporosis    Paraesophageal hernia    PFO (patent foramen ovale)    Pneumonia    RLS (restless legs syndrome)    Secondary hyperparathyroidism, renal (HCC)    Sleep disturbance    Stroke (HCC)    Type 2 diabetes mellitus with neurological manifestations, controlled (HCC)     Social History   Socioeconomic History   Marital status: Widowed    Spouse name: Not on file   Number of children: 2   Years of education: Not on file   Highest education level: Not on file  Occupational History   Occupation: [employer]  Tobacco Use   Smoking status: Never   Smokeless tobacco: Never  Vaping Use   Vaping status: Never Used  Substance and Sexual Activity   Alcohol use: Never    Alcohol/week: 0.0 standard drinks of alcohol   Drug use: Never   Sexual activity: Not on file  Other Topics Concern   Not on file  Social History Narrative   1 son died    1 son living--2 grandchildren      Had living will--not sure about it   No formal health care POA--would want son   Would accept resuscitation   No prolonged tube feeds if cognitively unaware   Social Drivers of Health   Financial Resource Strain: Not on file  Food Insecurity: Not on file  Transportation Needs: Not on file  Physical Activity: Not on file  Stress: No Stress Concern Present (01/13/2023)   Received from [facility]   [employer] [facility] - Occupational Stress Questionnaire    Feeling of Stress : Not at all  Social Connections: Unknown (12/31/2022)   [facility] from [facility]    Social Network    Social Network: Not on file  Intimate Partner Violence: Unknown (12/31/2022)   Received from [facility]   HITS    Physically Hurt: Not on file    Insult or Talk Down To: Not on file    Threaten Physical Harm: Not on file    Scream or Curse: Not on file    Past Surgical History:  Procedure Laterality Date   A/V FISTULAGRAM Left 06/03/2023   Procedure: A/V Fistulagram;  Surgeon: Celso College, MD;  Location: [facility];  Service: Cardiovascular;  Laterality: Left;   A/V FISTULAGRAM Left 08/16/2023   Procedure: A/V Fistulagram;  Surgeon: Celso College, MD;  Location: [facility];  Service: Cardiovascular;  Laterality: Left;   AV FISTULA PLACEMENT Left 04/14/2023   Procedure: ARTERIOVENOUS (AV) FISTULA CREATION;  Surgeon: Celso College, MD;  Location: [facility] ORS;  Service: Vascular;  Laterality: Left;   CATARACT EXTRACTION W/ INTRAOCULAR LENS  IMPLANT, BILATERAL     CHOLECYSTECTOMY     COLONOSCOPY     COLONOSCOPY WITH PROPOFOL  N/A 04/04/2018   Procedure: COLONOSCOPY WITH PROPOFOL ;  Surgeon: Deveron Fly, MD;  Location: [facility] ENDOSCOPY;  Service: Endoscopy;  Laterality: N/A;   DIALYSIS/PERMA CATHETER INSERTION N/A 01/23/2022   Procedure: DIALYSIS/PERMA CATHETER INSERTION;  Surgeon: Celso College, MD;  Location: [facility];  Service: Cardiovascular;  Laterality: N/A;   DIALYSIS/PERMA CATHETER INSERTION N/A 01/28/2023   Procedure: DIALYSIS/PERMA CATHETER INSERTION;  Surgeon: Celso College, MD;  Location: [facility];  Service: Cardiovascular;  Laterality: N/A;   DIALYSIS/PERMA CATHETER REMOVAL N/A 06/15/2022   Procedure: DIALYSIS/PERMA CATHETER REMOVAL;  Surgeon: Celso College, MD;  Location: [facility];  Service: Cardiovascular;  Laterality: N/A;   DIALYSIS/PERMA CATHETER REMOVAL N/A 07/06/2023   Procedure: DIALYSIS/PERMA CATHETER REMOVAL;  Surgeon: Jackquelyn Mass, MD;  Location: [facility];  Service:  Cardiovascular;  Laterality: N/A;   ESOPHAGOGASTRODUODENOSCOPY (EGD) WITH PROPOFOL  N/A 04/04/2018   Procedure: ESOPHAGOGASTRODUODENOSCOPY (EGD) WITH PROPOFOL ;  Surgeon: Deveron Fly, MD;  Location: [facility] ENDOSCOPY;  Service: Endoscopy;  Laterality: N/A;   HIATAL HERNIA REPAIR  1990   NISSEN FUNDOPLICATION  2021   PERITONEAL CATHETER INSERTION  04/09/2022   PERITONEAL CATHETER REMOVAL  02/2023   ROUX-EN-Y GASTRIC BYPASS  2021   TONSILLECTOMY AND ADENOIDECTOMY     TUBAL LIGATION     UPPER GASTROINTESTINAL ENDOSCOPY     Uroplasty  ~2000's   Dr Cherylene Corrente    Family History  Problem Relation Age of Onset   Cancer Mother        lung cancer   Parkinson's disease Father    Dementia Father    COPD Father    Heart disease Father    Cancer Sister        lung cancer   Cancer Son        Ewing's sarcoma   Breast cancer Neg Hx     Allergies  Allergen Reactions   Cefditoren Pivoxil     Other reaction(s): Other (See Comments) GI issues, severe abdominal pain   Clarithromycin     Other reaction(s): Other (See Comments) GI issues   Codeine Sulfate Itching   Erythromycin Other (See Comments)    GI issues   Other Other (See Comments)    Oral iron   Pseudoephedrine Other (See Comments)    Altered mental status   Sulfa Antibiotics Itching   Tape     Pt states she can only use silicone tape       Latest Ref Rng & Units 04/14/2023   10:25 AM 04/12/2023    3:43 PM 01/12/2023   11:43 AM  CBC  WBC 4.0 - 10.5 K/uL  7.3  10.1   Hemoglobin 12.0 - 15.0 g/dL 29.5  62.1  30.8   Hematocrit 36.0 - 46.0 % 41.0  42.9  39.4   Platelets 150 - 400 K/uL  238  392       CMP     Component Value Date/Time   NA 139 04/14/2023 1025   K 4.6 06/03/2023 1237   CL 106 04/14/2023 1025   CO2 22 04/12/2023 1543   GLUCOSE 99 04/14/2023 1025   BUN 46 (H) 04/14/2023 1025   CREATININE 4.80 (H) 04/14/2023 1025   CALCIUM  8.3 (L) 04/12/2023 1543   PROT 7.0 01/21/2022 1036   ALBUMIN 3.4 (L) 01/12/2023  1143   AST 20 01/21/2022 1036   ALT 15 01/21/2022 1036   ALKPHOS 88 01/21/2022 1036   BILITOT 0.7 01/21/2022 1036   GFR 40.72 (L) 01/01/2017 1432   GFRNONAA 25 (L) 04/12/2023 1543     No results found.     Assessment & Plan:   1. ESRD (end stage renal disease) (HCC) (Primary) Recommend:  The patient is experiencing increasing problems with their dialysis access.  Patient should have a fistulagram with the intention for intervention.  The intention for intervention is to restore appropriate flow and prevent thrombosis and possible loss of the access.  As well as improve the quality of dialysis therapy.  The risks, benefits and alternative therapies were reviewed in detail with the patient.  All questions were answered.  The patient agrees to proceed with angio/intervention.    The patient will follow up with me in the office after the procedure.   2. Essential hypertension Continue antihypertensive medications as already ordered, these medications have been reviewed and there are no changes at this time.  3. Type 2 diabetes mellitus with neurological manifestations, controlled (HCC) Continue hypoglycemic medications as already ordered, these medications have been reviewed and there are no changes at this time.  Hgb A1C to be monitored as already arranged by primary service   Current Outpatient Medications on File Prior to Visit  Medication Sig Dispense Refill  aspirin  EC 81 MG tablet Take 81 mg by mouth at bedtime.     buPROPion  (WELLBUTRIN  XL) 150 MG 24 hr tablet Take 150 mg by mouth at bedtime.     busPIRone  (BUSPAR ) 15 MG tablet Take 15 mg by mouth 3 (three) times daily.     calcium  acetate (PHOSLO ) 667 MG tablet Take 1,334 mg by mouth 3 (three) times daily.     carvedilol (COREG) 6.25 MG tablet Take 6.25 mg by mouth 2 (two) times daily with a meal.     cetirizine  (ZYRTEC ) 10 MG tablet Take 1 tablet (10 mg total) by mouth daily. (Patient taking differently: Take 10 mg by  mouth at bedtime.) 90 tablet 3   cholecalciferol (VITAMIN D3) 10 MCG (400 UNIT) TABS tablet Take 2,000 Units by mouth daily.     cyclobenzaprine  (FLEXERIL ) 10 MG tablet Take 1 tablet by mouth 3 (three) times daily as needed.     diphenoxylate -atropine  (LOMOTIL ) 2.5-0.025 MG tablet TAKE 1 TABLET BY MOUTH 4 TIMES DAILY AS NEEDED FOR DIARRHEA OR LOOSE STOOLS. 90 tablet 0   DULoxetine  (CYMBALTA ) 30 MG capsule Take 1 capsule (30 mg total) by mouth daily. (Patient taking differently: Take 30 mg by mouth at bedtime.) 90 capsule 3   famotidine  (PEPCID ) 20 MG tablet Take 20 mg by mouth daily.     furosemide (LASIX) 40 MG tablet Take 40 mg by mouth daily. [id number]     gabapentin  (NEURONTIN ) 100 MG capsule Take 100 mg by mouth at bedtime.     magnesium  gluconate (MAGONATE) 500 MG tablet Take 500 mg by mouth daily.     midodrine  (PROAMATINE ) 10 MG tablet Take 10 mg by mouth 3 (three) times daily as needed.     MIEBO 1.338 GM/ML SOLN      Multiple Vitamins-Minerals (ICAPS) TABS Take 1 tablet by mouth daily.     multivitamin (RENA-VIT) TABS tablet Take 1 tablet by mouth at bedtime.     ondansetron  (ZOFRAN ) 4 MG tablet Take by mouth as needed.     potassium chloride  SA (KLOR-CON  M) 20 MEQ tablet Take 2 tablets (40 mEq) today, then 1 tablet (20 mEq) BID tomorrow, then 1 tablet (20 mEq) on the day of surgery. Follow up with PCP/nephrology for repeat labs. 5 tablet 0   sennosides-docusate sodium (SENOKOT-S) 8.6-50 MG tablet Take 1 tablet by mouth daily as needed. Do not take this med within 2 hours of other meds     silver  sulfADIAZINE  (SILVADENE ) 1 % cream Apply topically continuously as needed     spironolactone (ALDACTONE) 25 MG tablet Take 25 mg by mouth daily.     valsartan (DIOVAN) 160 MG tablet Take 160 mg by mouth daily.     verapamil  (CALAN ) 40 MG tablet Take 20 mg by mouth as directed. Tuesday, Wednesday, Thursday,Saturday,Sunday     vitamin B-12 (CYANOCOBALAMIN) 500 MCG tablet Take 500 mcg by  mouth daily.     zolpidem  (AMBIEN ) 5 MG tablet Take 5 mg by mouth at bedtime as needed for sleep.     HYDROcodone -acetaminophen  (NORCO/VICODIN) 5-325 MG tablet Take 2 tablets by mouth every 6 (six) hours as needed for moderate pain. (Patient not taking: Reported on 06/03/2023) 20 tablet 0   Lifitegrast (XIIDRA) 5 % SOLN Apply to eye. (Patient not taking: Reported on 02/23/2024)     Magnesium  Oxide -Mg Supplement 500 MG TABS Take by mouth. (Patient not taking: Reported on 08/16/2023)     Current Facility-Administered Medications on File Prior to Visit  Medication Dose Route Frequency Provider Last Rate Last Admin   lidocaine -EPINEPHrine  (PF) (XYLOCAINE -EPINEPHrine ) 1 %-1:200000 (PF) injection    PRN Dew, Jason S, MD   10 mL at 06/03/23 1345    There are no Patient Instructions on file for this visit. No follow-ups on file.   Shaquile Lutze E Radley Teston, NP

## 2024-02-23 NOTE — H&P (View-Only) (Signed)
 Subjective:    Patient ID: Anita Greene, female    DOB: 10/03/1951, 73 y.o.   MRN: [id number] Chief Complaint  Patient presents with   Venous Insufficiency    The patient returns today for follow-up regarding her dialysis access.  She notes that the previous neuropathic arm pain she had are improved not completely resolved but they are much better than they were.  She does have occasional pain into that arm after dialysis.  She notes that her dialysis access is functioning fairly well with minimal issues.  Today she has several areas of stenosis in the distal upper arm as well as in the shoulder area.  Today she has a flow volume of 1285 which she is improved from the previous flow volume of 687.    Review of Systems  Hematological:  Bruises/bleeds easily.  All other systems reviewed and are negative.      Objective:   Physical Exam Vitals reviewed.  HENT:     Head: Normocephalic.  Cardiovascular:     Rate and Rhythm: Normal rate.     Pulses:          Radial pulses are 2+ on the left side.     Arteriovenous access: Left arteriovenous access is present. Pulmonary:     Effort: Pulmonary effort is normal.  Skin:    General: Skin is warm and dry.  Neurological:     Mental Status: She is alert and oriented to person, place, and time.  Psychiatric:        Mood and Affect: Mood normal.        Behavior: Behavior normal.        Thought Content: Thought content normal.        Judgment: Judgment normal.    BP 108/67 (BP Location: Right Arm, Patient Position: Sitting, Cuff Size: Normal)   Pulse 69   Resp 17   Ht 5' (1.524 m)   Wt 136 lb 9.6 oz (62 kg)   BMI 26.68 kg/m   Past Medical History:  Diagnosis Date   Allergic rhinitis due to pollen    Arthritis    B12 deficiency    Cataract cortical, senile    Chronic kidney disease    Colon polyp    Depression    End stage chronic kidney disease (HCC)    Fibromyalgia    GERD (gastroesophageal reflux disease)     Hemiparesis affecting right side as late effect of cerebrovascular accident (CVA) (HCC)    Hyperlipidemia    Hypertension    IBS (irritable bowel syndrome)    diarrhea prone   Iron deficiency anemia    Irritable bowel syndrome with diarrhea    Macular degeneration of left eye    Migraine headache    Mood disorder (HCC)    Obstructive sleep apnea    Optic atrophy of right eye    Osteoporosis    Paraesophageal hernia    PFO (patent foramen ovale)    Pneumonia    RLS (restless legs syndrome)    Secondary hyperparathyroidism, renal (HCC)    Sleep disturbance    Stroke (HCC)    Type 2 diabetes mellitus with neurological manifestations, controlled (HCC)     Social History   Socioeconomic History   Marital status: Widowed    Spouse name: Not on file   Number of children: 2   Years of education: Not on file   Highest education level: Not on file  Occupational History   Occupation: [employer]  Tobacco Use   Smoking status: Never   Smokeless tobacco: Never  Vaping Use   Vaping status: Never Used  Substance and Sexual Activity   Alcohol use: Never    Alcohol/week: 0.0 standard drinks of alcohol   Drug use: Never   Sexual activity: Not on file  Other Topics Concern   Not on file  Social History Narrative   1 son died    1 son living--2 grandchildren      Had living will--not sure about it   No formal health care POA--would want son   Would accept resuscitation   No prolonged tube feeds if cognitively unaware   Social Drivers of Health   Financial Resource Strain: Not on file  Food Insecurity: Not on file  Transportation Needs: Not on file  Physical Activity: Not on file  Stress: No Stress Concern Present (01/13/2023)   Received from [facility]   [employer] [facility] - Occupational Stress Questionnaire    Feeling of Stress : Not at all  Social Connections: Unknown (12/31/2022)   Received from [facility]    Social Network    Social Network: Not on file  Intimate Partner Violence: Unknown (12/31/2022)   Received from [facility]   HITS    Physically Hurt: Not on file    Insult or Talk Down To: Not on file    Threaten Physical Harm: Not on file    Scream or Curse: Not on file    Past Surgical History:  Procedure Laterality Date   A/V FISTULAGRAM Left 06/03/2023   Procedure: A/V Fistulagram;  Surgeon: Celso College, MD;  Location: [facility] INVASIVE CV [facility];  Service: Cardiovascular;  Laterality: Left;   A/V FISTULAGRAM Left 08/16/2023   Procedure: A/V Fistulagram;  Surgeon: Celso College, MD;  Location: [facility];  Service: Cardiovascular;  Laterality: Left;   AV FISTULA PLACEMENT Left 04/14/2023   Procedure: ARTERIOVENOUS (AV) FISTULA CREATION;  Surgeon: Celso College, MD;  Location: [facility] ORS;  Service: Vascular;  Laterality: Left;   CATARACT EXTRACTION W/ INTRAOCULAR LENS  IMPLANT, BILATERAL     CHOLECYSTECTOMY     COLONOSCOPY     COLONOSCOPY WITH PROPOFOL  N/A 04/04/2018   Procedure: COLONOSCOPY WITH PROPOFOL ;  Surgeon: Deveron Fly, MD;  Location: [facility] ENDOSCOPY;  Service: Endoscopy;  Laterality: N/A;   DIALYSIS/PERMA CATHETER INSERTION N/A 01/23/2022   Procedure: DIALYSIS/PERMA CATHETER INSERTION;  Surgeon: Celso College, MD;  Location: [facility];  Service: Cardiovascular;  Laterality: N/A;   DIALYSIS/PERMA CATHETER INSERTION N/A 01/28/2023   Procedure: DIALYSIS/PERMA CATHETER INSERTION;  Surgeon: Celso College, MD;  Location: [facility];  Service: Cardiovascular;  Laterality: N/A;   DIALYSIS/PERMA CATHETER REMOVAL N/A 06/15/2022   Procedure: DIALYSIS/PERMA CATHETER REMOVAL;  Surgeon: Celso College, MD;  Location: [facility];  Service: Cardiovascular;  Laterality: N/A;   DIALYSIS/PERMA CATHETER REMOVAL N/A 07/06/2023   Procedure: DIALYSIS/PERMA CATHETER REMOVAL;  Surgeon: Jackquelyn Mass, MD;  Location: [facility];  Service:  Cardiovascular;  Laterality: N/A;   ESOPHAGOGASTRODUODENOSCOPY (EGD) WITH PROPOFOL  N/A 04/04/2018   Procedure: ESOPHAGOGASTRODUODENOSCOPY (EGD) WITH PROPOFOL ;  Surgeon: Deveron Fly, MD;  Location: [facility] ENDOSCOPY;  Service: Endoscopy;  Laterality: N/A;   HIATAL HERNIA REPAIR  1990   NISSEN FUNDOPLICATION  2021   PERITONEAL CATHETER INSERTION  04/09/2022   PERITONEAL CATHETER REMOVAL  02/2023   ROUX-EN-Y GASTRIC BYPASS  2021   TONSILLECTOMY AND ADENOIDECTOMY     TUBAL LIGATION     UPPER GASTROINTESTINAL ENDOSCOPY     Uroplasty  ~2000's   Dr Cherylene Corrente    Family History  Problem Relation Age of Onset   Cancer Mother        lung cancer   Parkinson's disease Father    Dementia Father    COPD Father    Heart disease Father    Cancer Sister        lung cancer   Cancer Son        Ewing's sarcoma   Breast cancer Neg Hx     Allergies  Allergen Reactions   Cefditoren Pivoxil     Other reaction(s): Other (See Comments) GI issues, severe abdominal pain   Clarithromycin     Other reaction(s): Other (See Comments) GI issues   Codeine Sulfate Itching   Erythromycin Other (See Comments)    GI issues   Other Other (See Comments)    Oral iron   Pseudoephedrine Other (See Comments)    Altered mental status   Sulfa Antibiotics Itching   Tape     Pt states she can only use silicone tape       Latest Ref Rng & Units 04/14/2023   10:25 AM 04/12/2023    3:43 PM 01/12/2023   11:43 AM  CBC  WBC 4.0 - 10.5 K/uL  7.3  10.1   Hemoglobin 12.0 - 15.0 g/dL 29.5  62.1  30.8   Hematocrit 36.0 - 46.0 % 41.0  42.9  39.4   Platelets 150 - 400 K/uL  238  392       CMP     Component Value Date/Time   NA 139 04/14/2023 1025   K 4.6 06/03/2023 1237   CL 106 04/14/2023 1025   CO2 22 04/12/2023 1543   GLUCOSE 99 04/14/2023 1025   BUN 46 (H) 04/14/2023 1025   CREATININE 4.80 (H) 04/14/2023 1025   CALCIUM  8.3 (L) 04/12/2023 1543   PROT 7.0 01/21/2022 1036   ALBUMIN 3.4 (L) 01/12/2023  1143   AST 20 01/21/2022 1036   ALT 15 01/21/2022 1036   ALKPHOS 88 01/21/2022 1036   BILITOT 0.7 01/21/2022 1036   GFR 40.72 (L) 01/01/2017 1432   GFRNONAA 25 (L) 04/12/2023 1543     No results found.     Assessment & Plan:   1. ESRD (end stage renal disease) (HCC) (Primary) Recommend:  The patient is experiencing increasing problems with their dialysis access.  Patient should have a fistulagram with the intention for intervention.  The intention for intervention is to restore appropriate flow and prevent thrombosis and possible loss of the access.  As well as improve the quality of dialysis therapy.  The risks, benefits and alternative therapies were reviewed in detail with the patient.  All questions were answered.  The patient agrees to proceed with angio/intervention.    The patient will follow up with me in the office after the procedure.   2. Essential hypertension Continue antihypertensive medications as already ordered, these medications have been reviewed and there are no changes at this time.  3. Type 2 diabetes mellitus with neurological manifestations, controlled (HCC) Continue hypoglycemic medications as already ordered, these medications have been reviewed and there are no changes at this time.  Hgb A1C to be monitored as already arranged by primary service   Current Outpatient Medications on File Prior to Visit  Medication Sig Dispense Refill  aspirin  EC 81 MG tablet Take 81 mg by mouth at bedtime.     buPROPion  (WELLBUTRIN  XL) 150 MG 24 hr tablet Take 150 mg by mouth at bedtime.     busPIRone  (BUSPAR ) 15 MG tablet Take 15 mg by mouth 3 (three) times daily.     calcium  acetate (PHOSLO ) 667 MG tablet Take 1,334 mg by mouth 3 (three) times daily.     carvedilol (COREG) 6.25 MG tablet Take 6.25 mg by mouth 2 (two) times daily with a meal.     cetirizine  (ZYRTEC ) 10 MG tablet Take 1 tablet (10 mg total) by mouth daily. (Patient taking differently: Take 10 mg by  mouth at bedtime.) 90 tablet 3   cholecalciferol (VITAMIN D3) 10 MCG (400 UNIT) TABS tablet Take 2,000 Units by mouth daily.     cyclobenzaprine  (FLEXERIL ) 10 MG tablet Take 1 tablet by mouth 3 (three) times daily as needed.     diphenoxylate -atropine  (LOMOTIL ) 2.5-0.025 MG tablet TAKE 1 TABLET BY MOUTH 4 TIMES DAILY AS NEEDED FOR DIARRHEA OR LOOSE STOOLS. 90 tablet 0   DULoxetine  (CYMBALTA ) 30 MG capsule Take 1 capsule (30 mg total) by mouth daily. (Patient taking differently: Take 30 mg by mouth at bedtime.) 90 capsule 3   famotidine  (PEPCID ) 20 MG tablet Take 20 mg by mouth daily.     furosemide (LASIX) 40 MG tablet Take 40 mg by mouth daily. [id number]     gabapentin  (NEURONTIN ) 100 MG capsule Take 100 mg by mouth at bedtime.     magnesium  gluconate (MAGONATE) 500 MG tablet Take 500 mg by mouth daily.     midodrine  (PROAMATINE ) 10 MG tablet Take 10 mg by mouth 3 (three) times daily as needed.     MIEBO 1.338 GM/ML SOLN      Multiple Vitamins-Minerals (ICAPS) TABS Take 1 tablet by mouth daily.     multivitamin (RENA-VIT) TABS tablet Take 1 tablet by mouth at bedtime.     ondansetron  (ZOFRAN ) 4 MG tablet Take by mouth as needed.     potassium chloride  SA (KLOR-CON  M) 20 MEQ tablet Take 2 tablets (40 mEq) today, then 1 tablet (20 mEq) BID tomorrow, then 1 tablet (20 mEq) on the day of surgery. Follow up with PCP/nephrology for repeat labs. 5 tablet 0   sennosides-docusate sodium (SENOKOT-S) 8.6-50 MG tablet Take 1 tablet by mouth daily as needed. Do not take this med within 2 hours of other meds     silver  sulfADIAZINE  (SILVADENE ) 1 % cream Apply topically continuously as needed     spironolactone (ALDACTONE) 25 MG tablet Take 25 mg by mouth daily.     valsartan (DIOVAN) 160 MG tablet Take 160 mg by mouth daily.     verapamil  (CALAN ) 40 MG tablet Take 20 mg by mouth as directed. Tuesday, Wednesday, Thursday,Saturday,Sunday     vitamin B-12 (CYANOCOBALAMIN) 500 MCG tablet Take 500 mcg by  mouth daily.     zolpidem  (AMBIEN ) 5 MG tablet Take 5 mg by mouth at bedtime as needed for sleep.     HYDROcodone -acetaminophen  (NORCO/VICODIN) 5-325 MG tablet Take 2 tablets by mouth every 6 (six) hours as needed for moderate pain. (Patient not taking: Reported on 06/03/2023) 20 tablet 0   Lifitegrast (XIIDRA) 5 % SOLN Apply to eye. (Patient not taking: Reported on 02/23/2024)     Magnesium  Oxide -Mg Supplement 500 MG TABS Take by mouth. (Patient not taking: Reported on 08/16/2023)     Current Facility-Administered Medications on File Prior to Visit  Medication Dose Route Frequency Provider Last Rate Last Admin   lidocaine -EPINEPHrine  (PF) (XYLOCAINE -EPINEPHrine ) 1 %-1:200000 (PF) injection    PRN Dew, Jason S, MD   10 mL at 06/03/23 1345    There are no Patient Instructions on file for this visit. No follow-ups on file.   Shaquile Lutze E Radley Teston, NP

## 2024-02-24 DIAGNOSIS — D509 Iron deficiency anemia, unspecified: Secondary | ICD-10-CM | POA: Diagnosis not present

## 2024-02-24 DIAGNOSIS — N186 End stage renal disease: Secondary | ICD-10-CM | POA: Diagnosis not present

## 2024-02-24 DIAGNOSIS — Z992 Dependence on renal dialysis: Secondary | ICD-10-CM | POA: Diagnosis not present

## 2024-02-24 DIAGNOSIS — D631 Anemia in chronic kidney disease: Secondary | ICD-10-CM | POA: Diagnosis not present

## 2024-02-28 ENCOUNTER — Telehealth (INDEPENDENT_AMBULATORY_CARE_PROVIDER_SITE_OTHER): Payer: Self-pay

## 2024-02-28 NOTE — Telephone Encounter (Signed)
 Spoke with the patient and she is concerned about transportation, she will call back to schedule her left arm fistulagram with Dr. Vonna Guardian after she speaks with the dialysis center.

## 2024-02-29 NOTE — Telephone Encounter (Signed)
 Patient returned my call and is scheduled with Dr. Vonna Guardian for a left arm fistulagram on 03/16/24 with a 12:00 pm arrival time to the [facility]. Pre-procedure instructions were discussed and will be sent to Mychart and mailed. Patient was offered 03/09/24 and 03/13/24 and declined due to transportation.

## 2024-03-01 ENCOUNTER — Ambulatory Visit
Admission: RE | Admit: 2024-03-01 | Discharge: 2024-03-01 | Disposition: A | Discharge status: Home / Self Care | Source: Ambulatory Visit | Attending: Family Medicine | Admitting: Family Medicine

## 2024-03-01 DIAGNOSIS — Z1231 Encounter for screening mammogram for malignant neoplasm of breast: Secondary | ICD-10-CM | POA: Insufficient documentation

## 2024-03-08 IMAGING — CT CT L SPINE W/O CM
5 of 6 series · 16 of 33 positions shown, 17 images · non-contrast
Comparison: Prior CT scan from 4336

CLINICAL DATA: Low back pain for 4 weeks.



[Series 3: multi disc · axial · 0.28mm/px · z∈[-538,-453]mm · 2 of 110 slices shown (1 of 2)]
[im 37/110  bone]
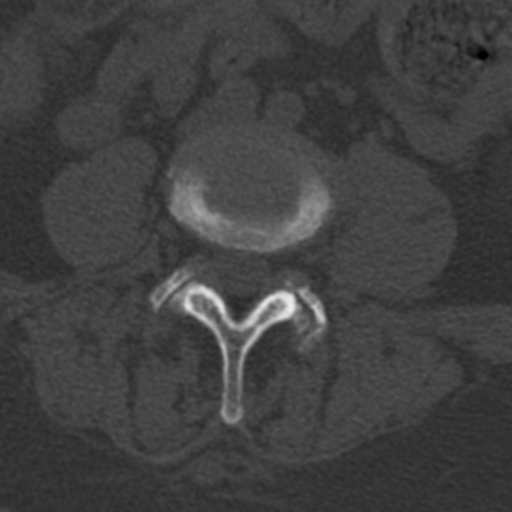
[im 73/110  bone]
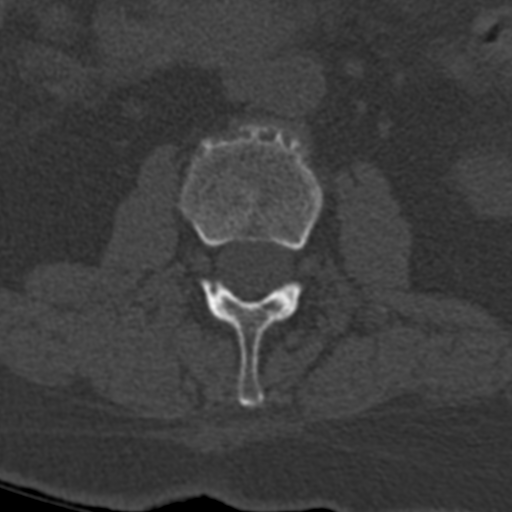

[Series 4: multi disc · axial · 0.28mm/px · z∈[-562,-433]mm · 3 of 110 slices shown, 4 images (2 of 2)]
[im 28/110  soft-tissue]
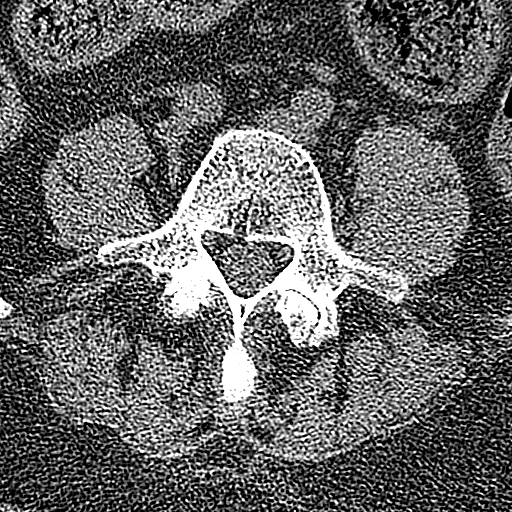
[im 28/110  bone]
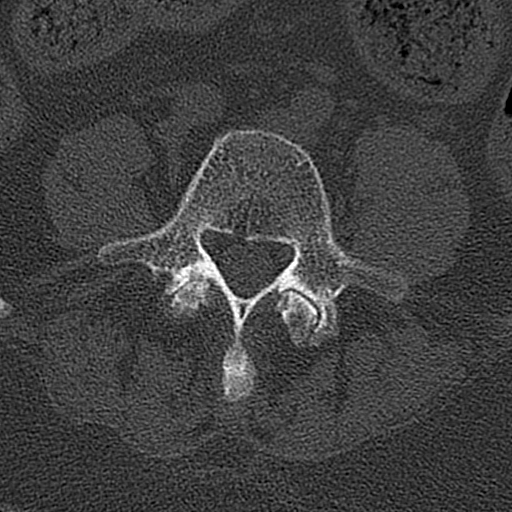
[im 55/110  bone]
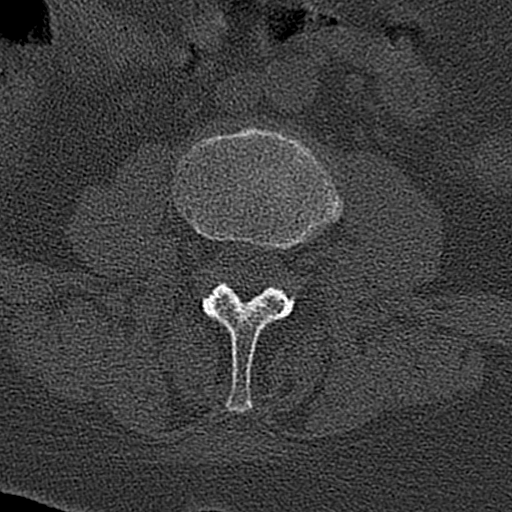
[im 82/110  bone]
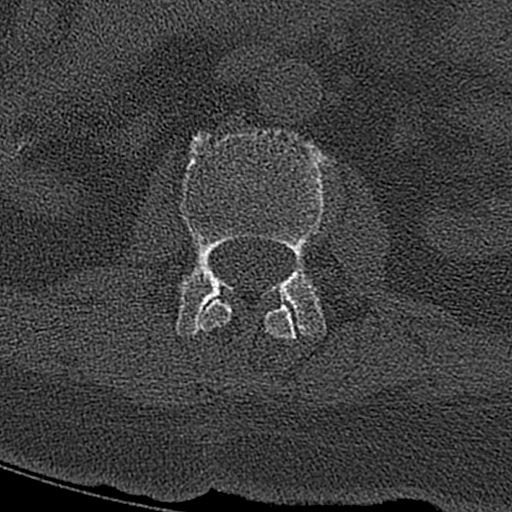

[Series 6: l spine soft · axial · 0.35mm/px · z∈[-554,-446]mm · 3 of 109 slices shown]
[im 28/109  soft-tissue]
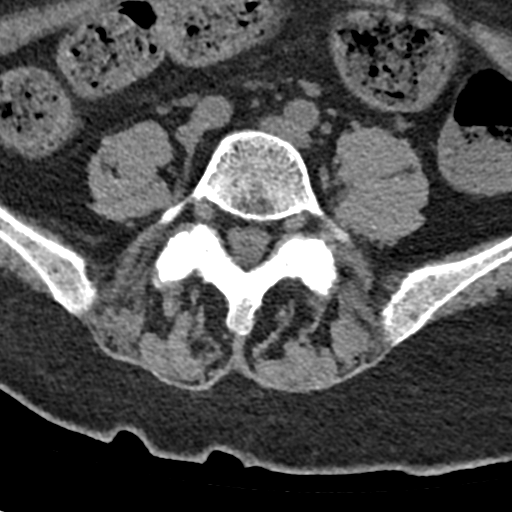
[im 55/109  soft-tissue]
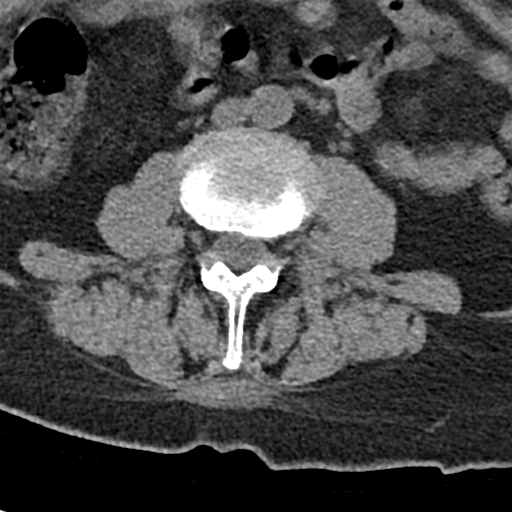
[im 82/109  soft-tissue]
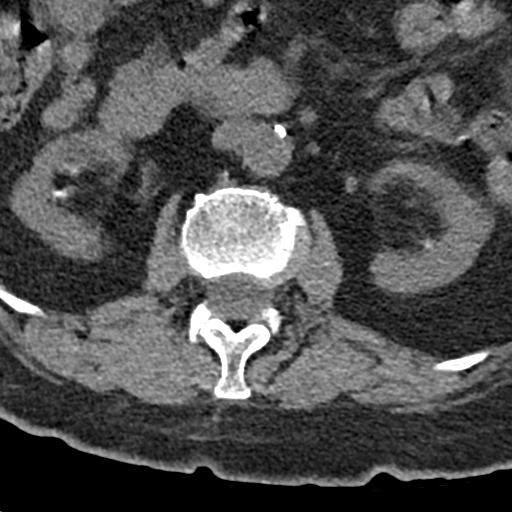

[Series 8: sagittal st · sagittal · 0.33mm/px · 5 of 85 slices shown]
[im 15/85  bone]
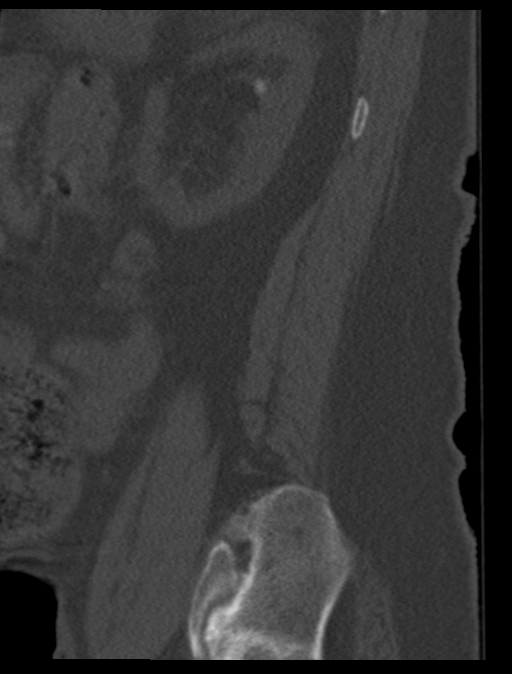
[im 29/85  bone]
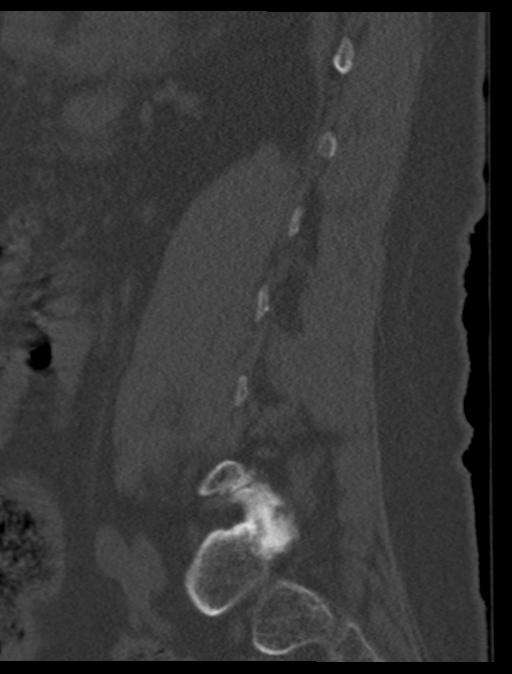
[im 43/85  bone]
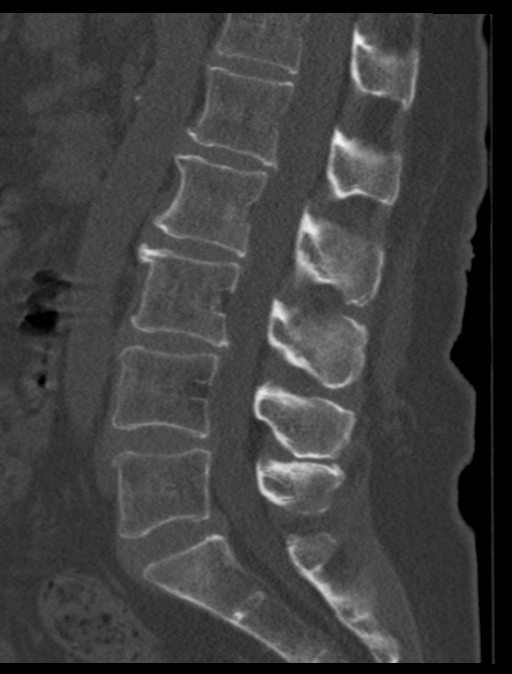
[im 57/85  bone]
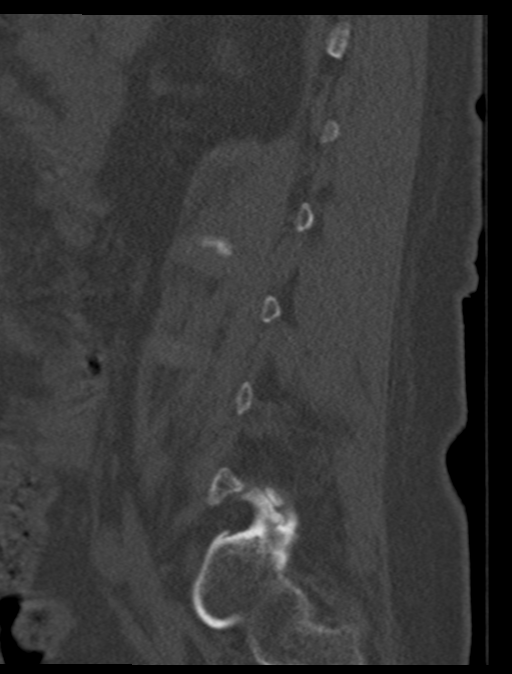
[im 71/85  bone]
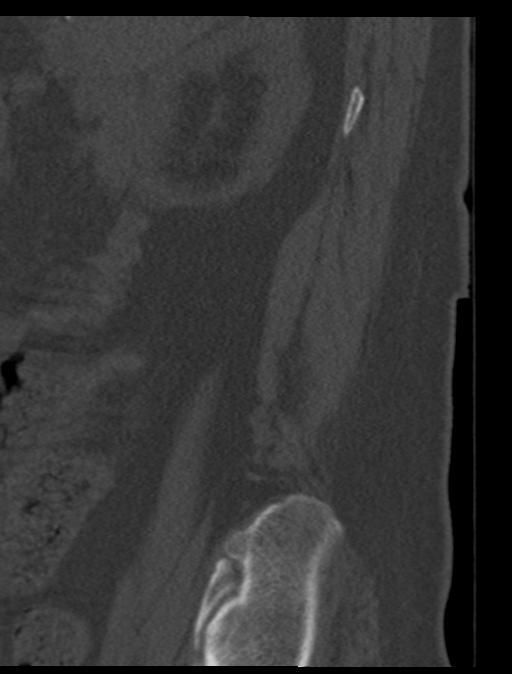

[Series 9: coronal bone · coronal · 0.37mm/px · 3 of 71 slices shown]
[im 15/71  bone]
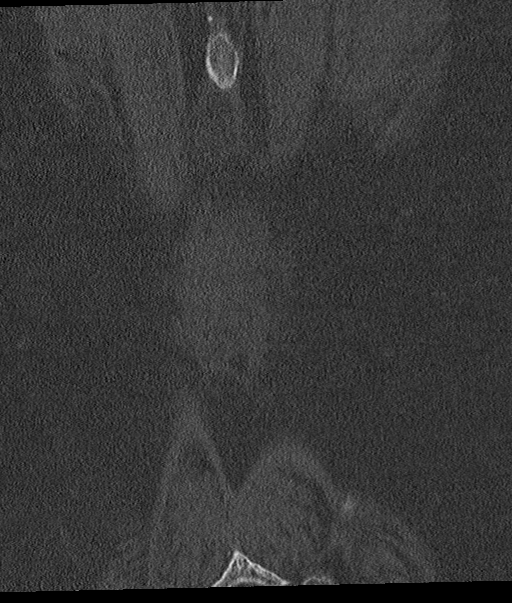
[im 29/71  bone]
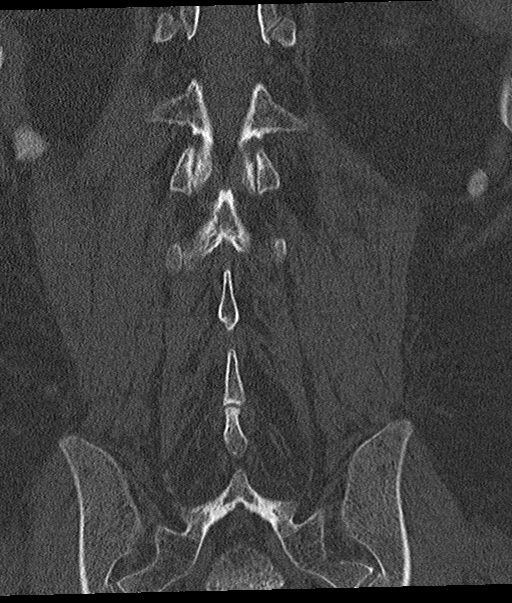
[im 43/71  bone]
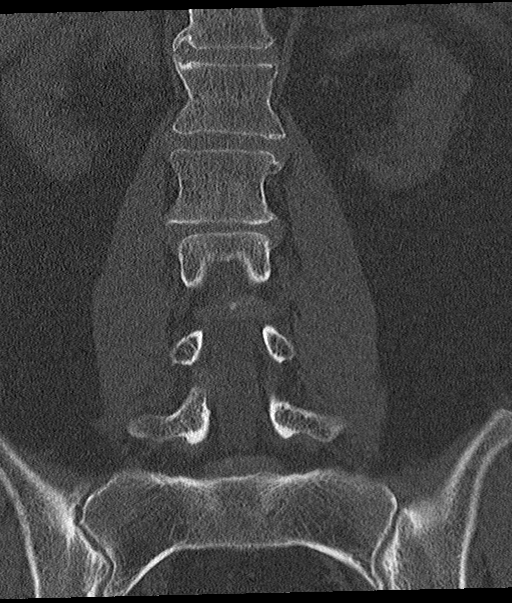

[16 of 33 positions shown; findings below may reference images not displayed]

FINDINGS: Segmentation: There are five lumbar type vertebral bodies. The last
full intervertebral disc space is labeled L5-S1.

Alignment: Normal

Vertebrae: No acute bony findings or worrisome bone lesion. The
facets are normally aligned. No pars defects.

Paraspinal and other soft tissues: Scattered aortic calcifications
but no aneurysm. Small bilateral renal calculi but no
hydroureteronephrosis.

Disc levels: T12-L1: No significant findings.

L1-2: No significant findings.

L2-3: Mild diffuse annular bulge with mild lateral recess
encroachment no spinal or foraminal stenosis.

L3-4: Diffuse annular bulge and mild facet disease with mild
bilateral lateral recess encroachment. No spinal or foraminal
stenosis.

L4-5: Mild diffuse annular bulge and moderate facet disease but no
disc protrusions, spinal or foraminal stenosis.

L5-S1: Diffuse bulging annulus but no focal disc protrusion, spinal
or foraminal stenosis. Moderate to advanced facet disease.
IMPRESSION: 1. No acute bony findings or destructive bony changes.
2. No large disc protrusions, significant spinal or foraminal
stenosis.
3. Bilateral renal calculi noted.

Aortic Atherosclerosis (CIIAE-OUI.I).

## 2024-03-08 IMAGING — CT CT CERVICAL SPINE W/O CM
3 of 4 series · 12 of 34 positions shown, 14 images · non-contrast
Comparison: None.

CLINICAL DATA: Weakness and lower back pain.



[Series 4: sagittal bone · sagittal · 0.23mm/px · 5 of 63 slices shown, 6 images]
[im 21/63  bone]
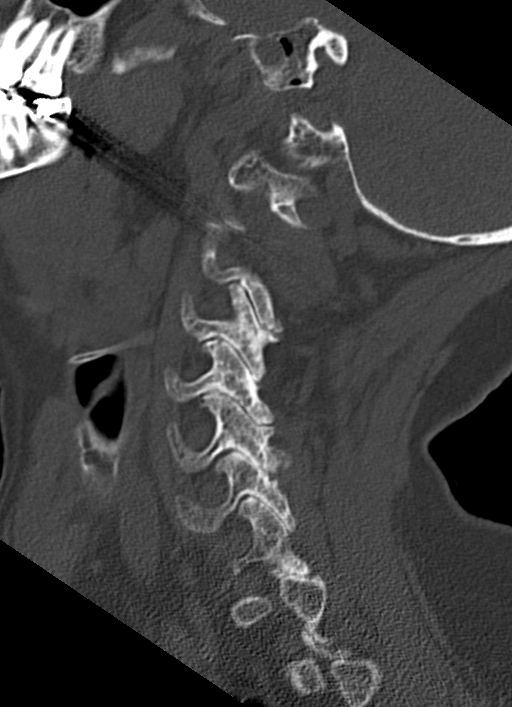
[im 26/63  bone]
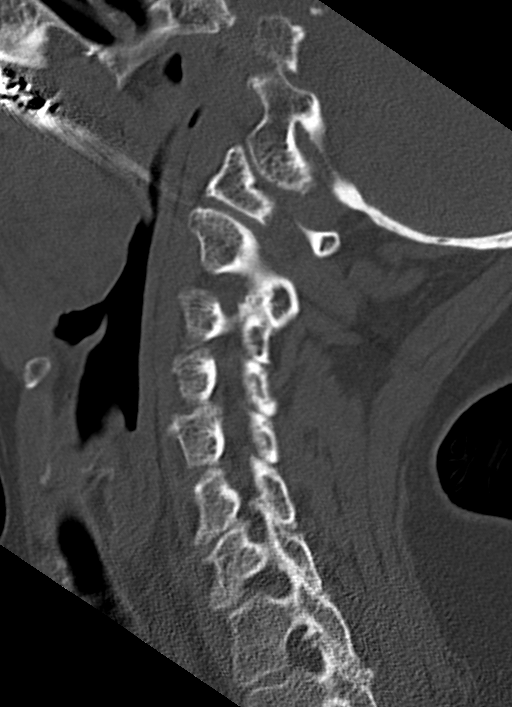
[im 32/63  soft-tissue]
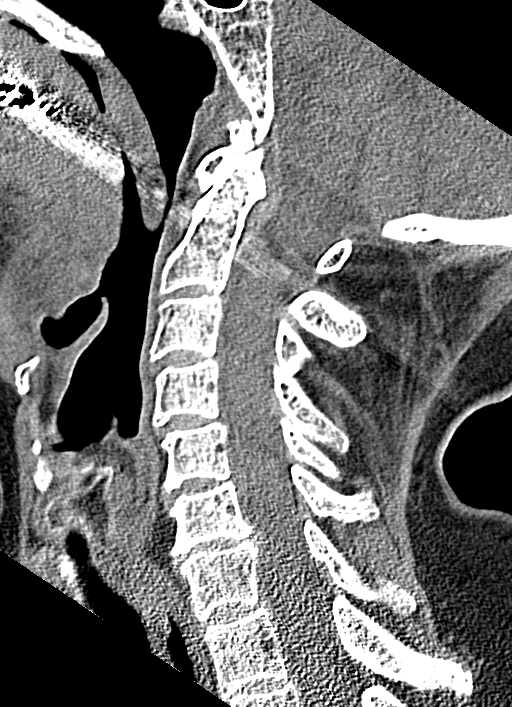
[im 32/63  bone]
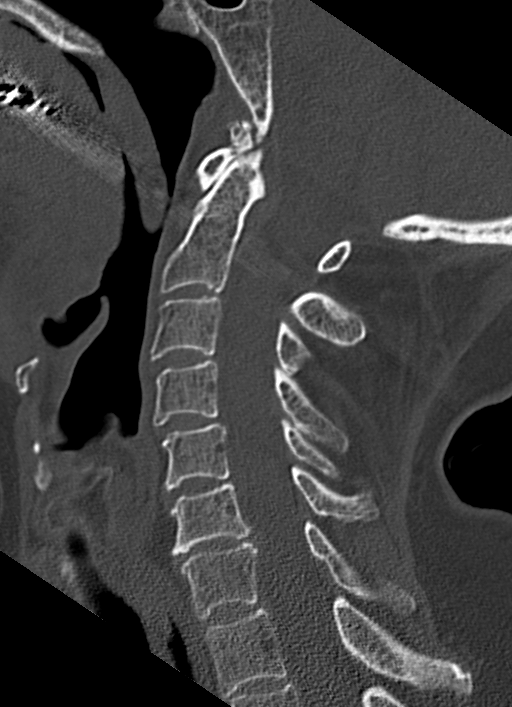
[im 37/63  bone]
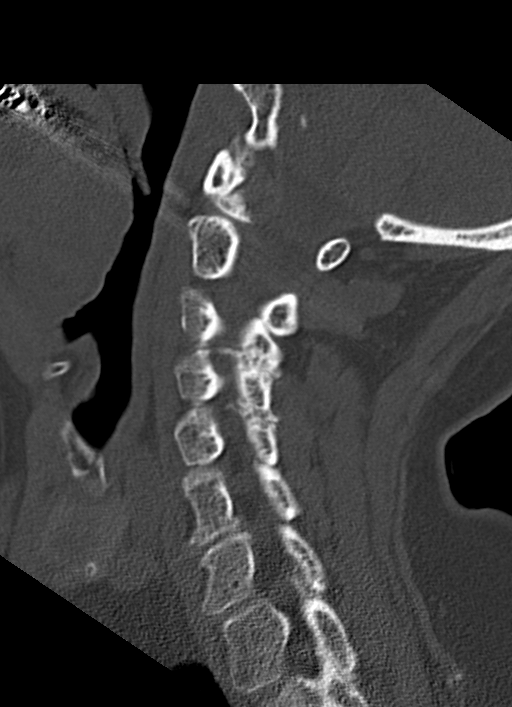
[im 42/63  bone]
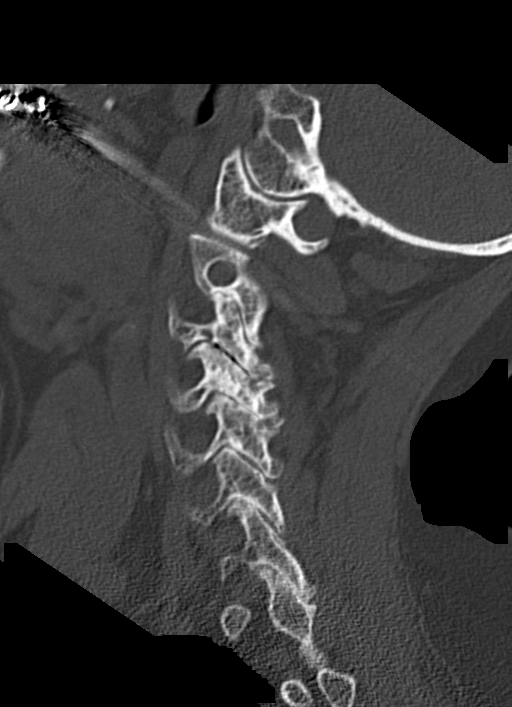

[Series 5: coronal bone · coronal · 0.22mm/px · 3 of 56 slices shown]
[im 13/56  bone]
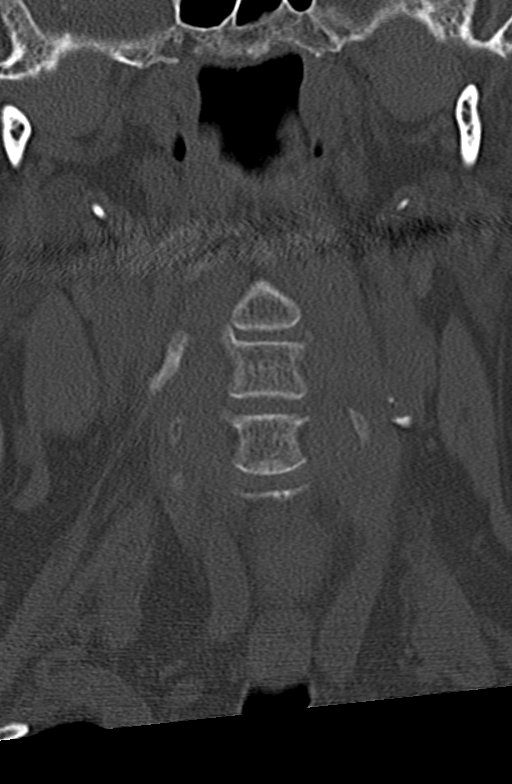
[im 23/56  bone]
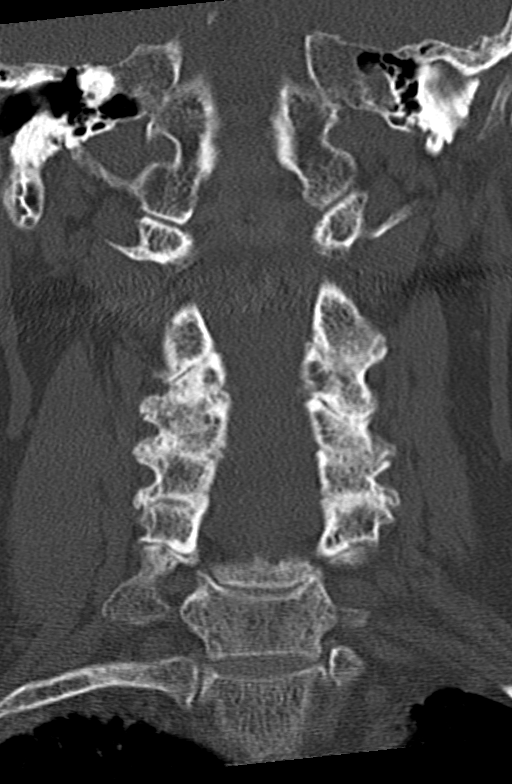
[im 33/56  bone]
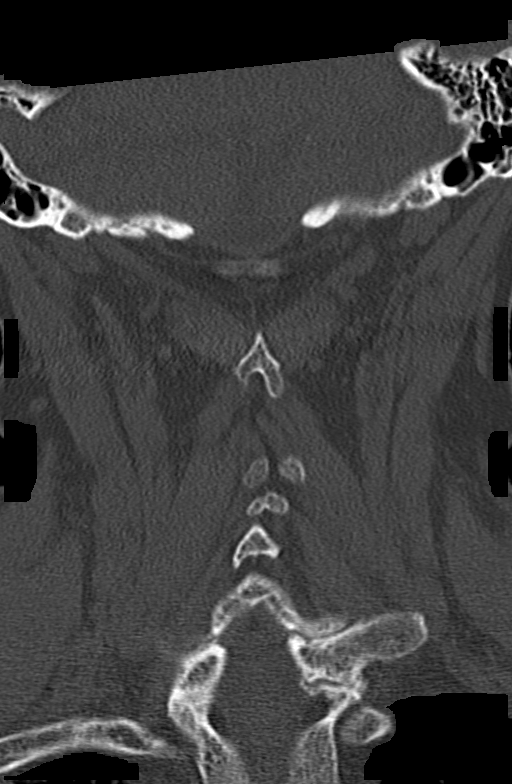

[Series 6: orthogonal bone · axial · 0.22mm/px · z∈[-192,-98]mm · 4 of 83 slices shown, 5 images]
[im 14/83  soft-tissue]
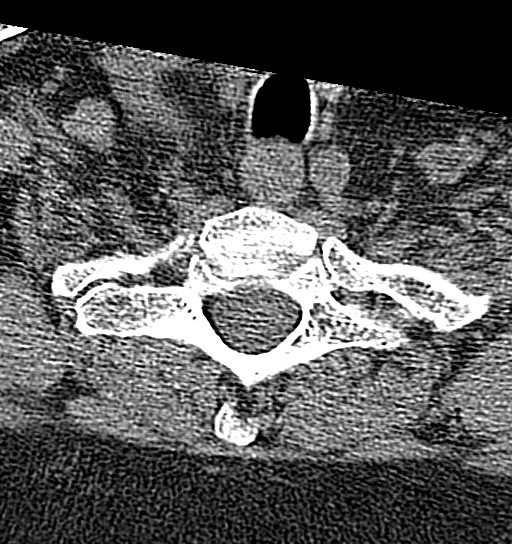
[im 14/83  bone]
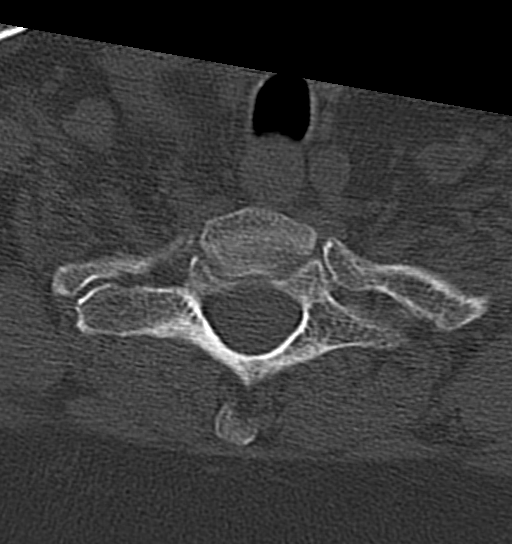
[im 28/83  bone]
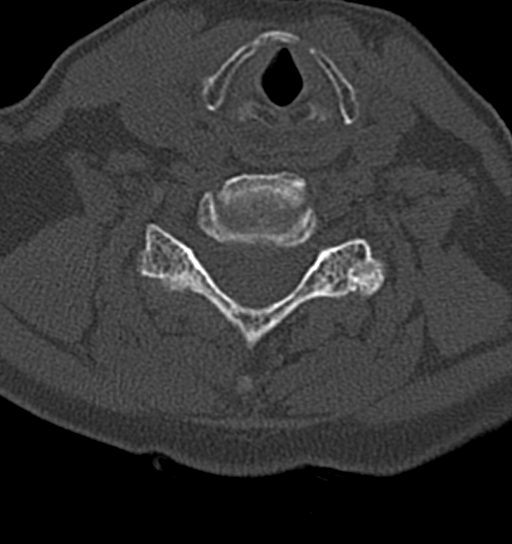
[im 55/83  bone]
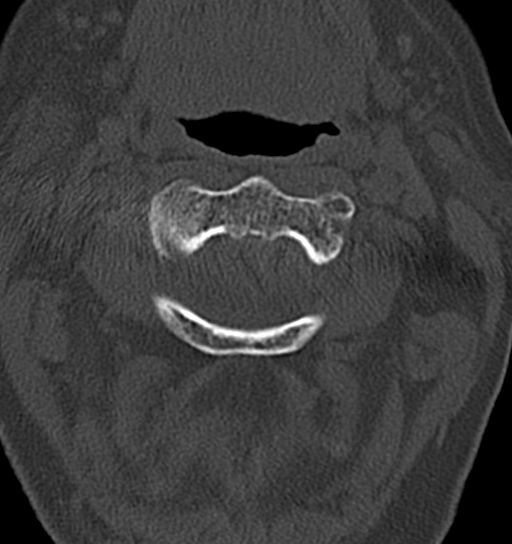
[im 69/83  bone]
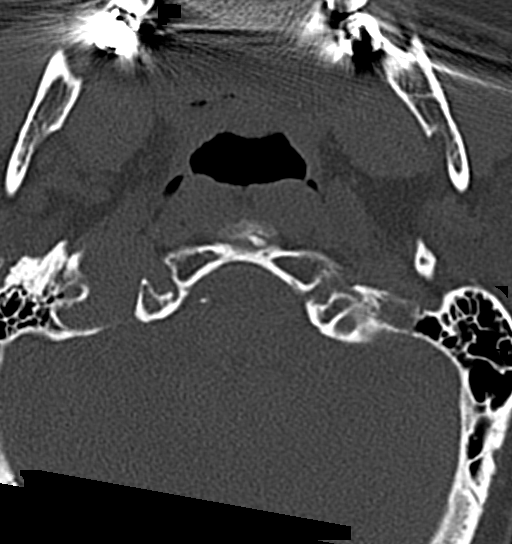

[12 of 34 positions shown; findings below may reference images not displayed]

FINDINGS: Alignment: Normal.

Skull base and vertebrae: No acute fracture. Chronic and
degenerative changes are seen involving the tip of the dens and
adjacent portion of the anterior arch of C1.

Soft tissues and spinal canal: No prevertebral fluid or swelling. No
visible canal hematoma.

Disc levels: Mild endplate sclerosis and mild anterior osteophyte
formation are seen at the levels of C4-C5 and C5-C6. Mild to
moderate severity endplate sclerosis and anterior osteophyte
formation is seen at C6-C7.

There is marked severity narrowing of the anterior atlantoaxial
articulation. Moderate severity intervertebral disc space narrowing
is seen at C6-C7 with mild intervertebral disc space narrowing seen
at C4-C5 and C5-C6.

Bilateral marked severity multilevel facet joint hypertrophy is
noted.

Upper chest: There is mild biapical scarring and/or atelectasis.

Other: None.
IMPRESSION: 1. No acute fracture or subluxation in the cervical spine.
2. Mild to moderate severity degenerative disc disease at the levels
of C4-C5 and C5-C6 and C6-C7.

## 2024-03-09 IMAGING — CT CT ABD-PELV W/O CM
2 of 4 series · 15 of 46 positions shown, 17 images · non-contrast
Comparison: July 14, 2011

CLINICAL DATA: History of gastric bypass surgery with nausea and
vomiting.



[Series 2: routine abd/pel wo · axial · 0.79mm/px · z∈[-752,-312]mm · 12 of 97 slices shown, 14 images]
[im 5/97  soft-tissue]
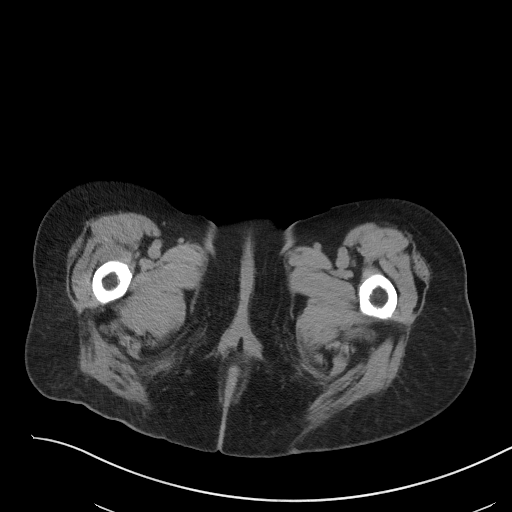
[im 5/97  bone]
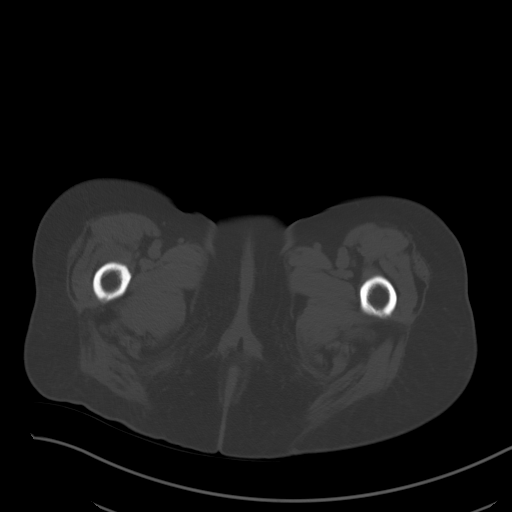
[im 13/97  soft-tissue]
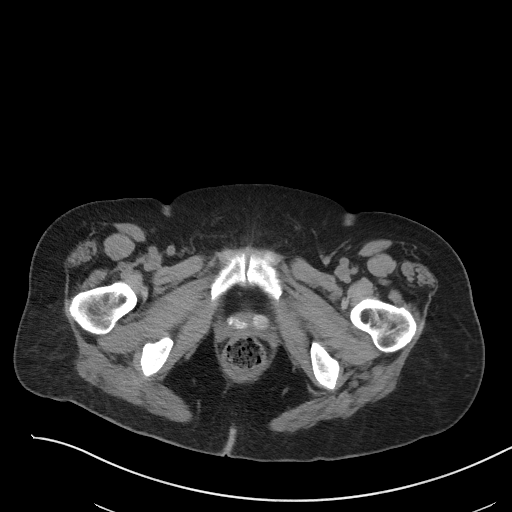
[im 21/97  soft-tissue]
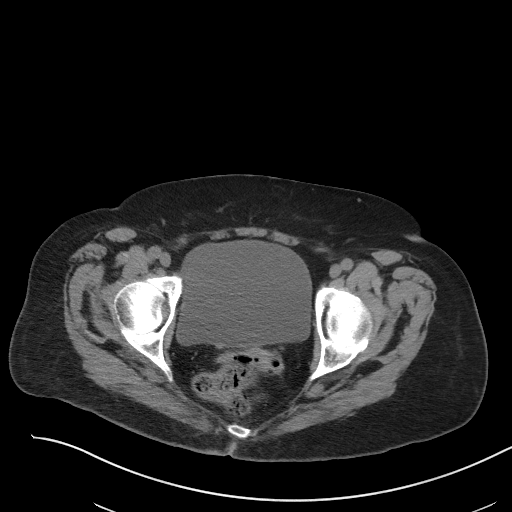
[im 29/97  soft-tissue]
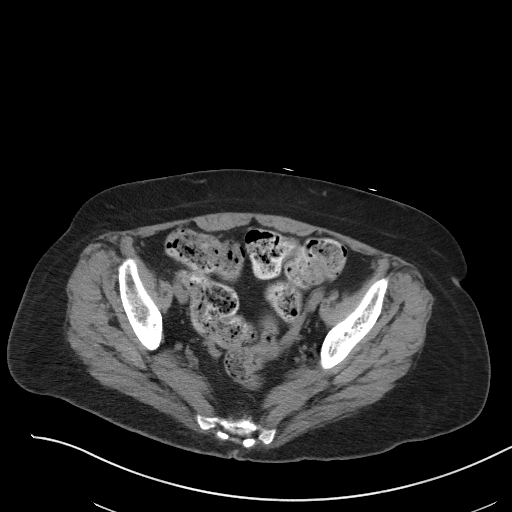
[im 37/97  soft-tissue]
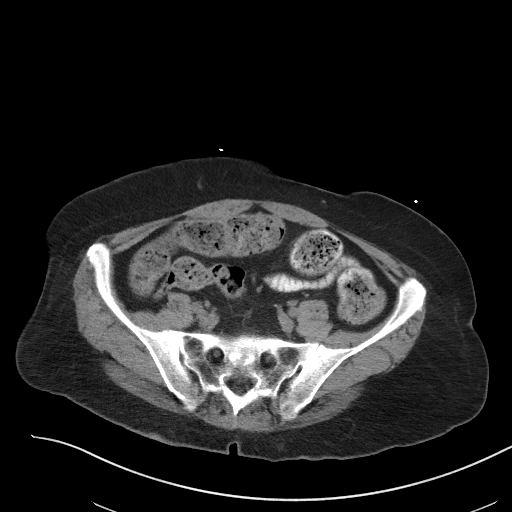
[im 45/97  soft-tissue]
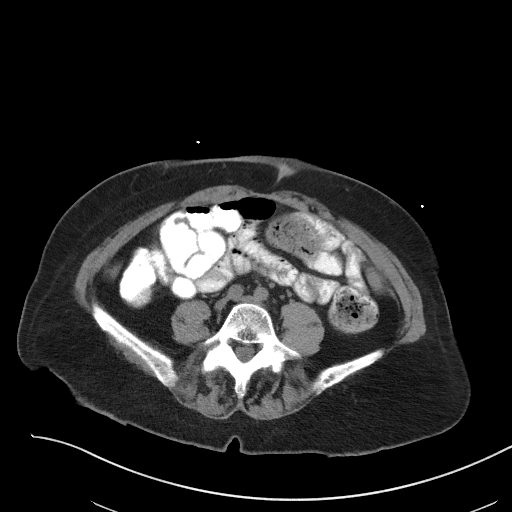
[im 53/97  soft-tissue]
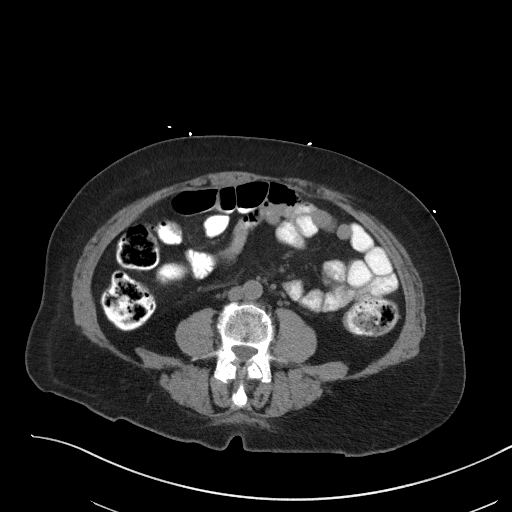
[im 61/97  soft-tissue]
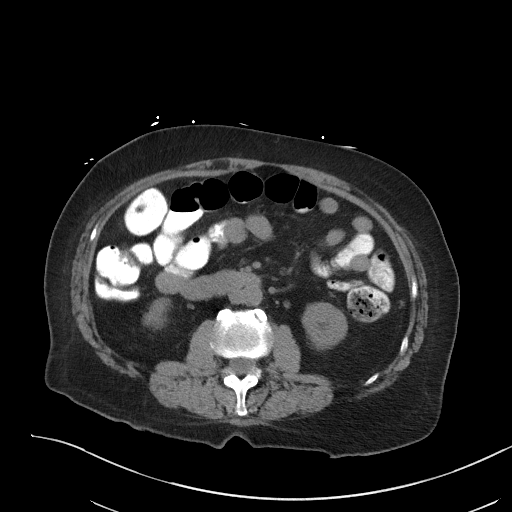
[im 69/97  soft-tissue]
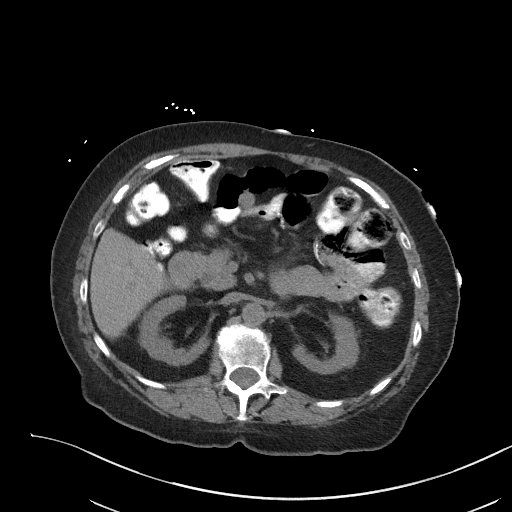
[im 69/97  bone]
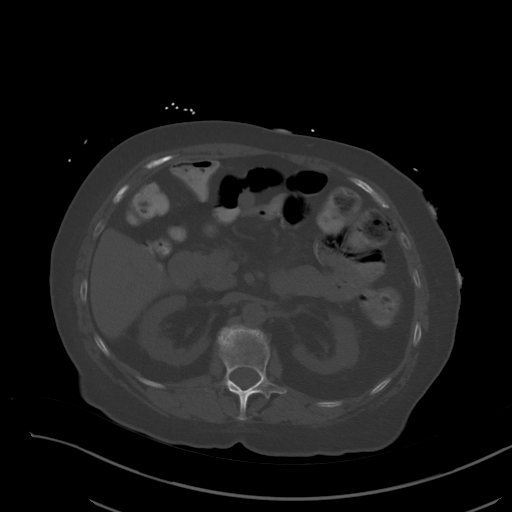
[im 77/97  soft-tissue]
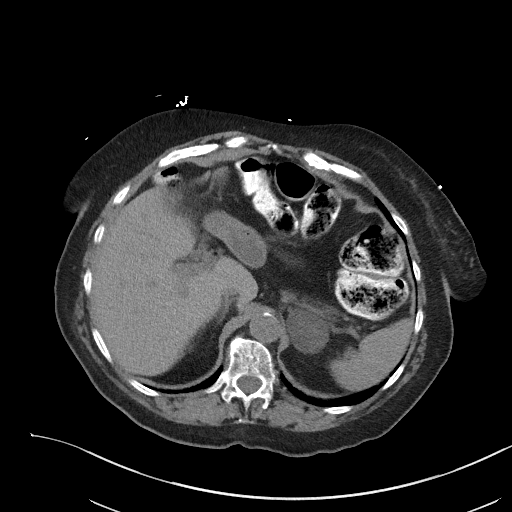
[im 85/97  soft-tissue]
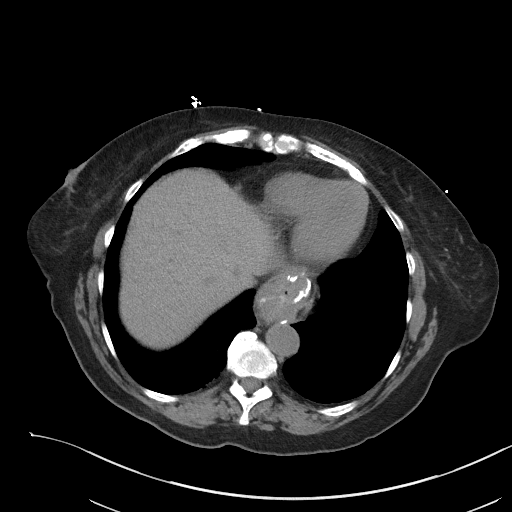
[im 93/97  soft-tissue]
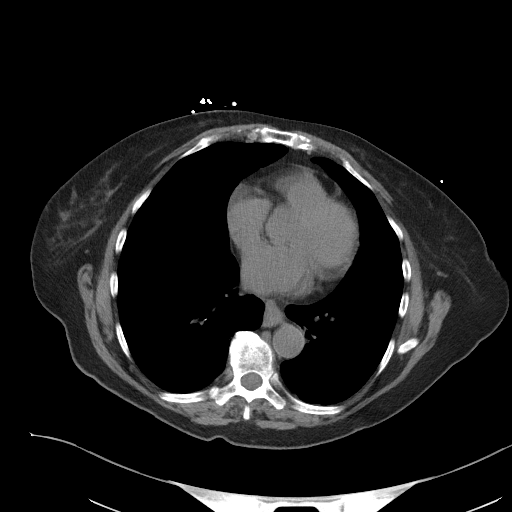

[Series 5: coronal st · coronal · 0.72mm/px · 3 of 88 slices shown]
[im 30/88  soft-tissue]
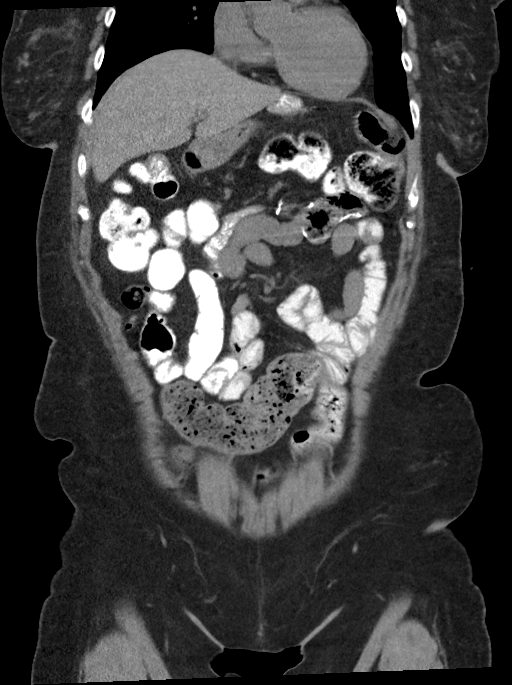
[im 39/88  soft-tissue]
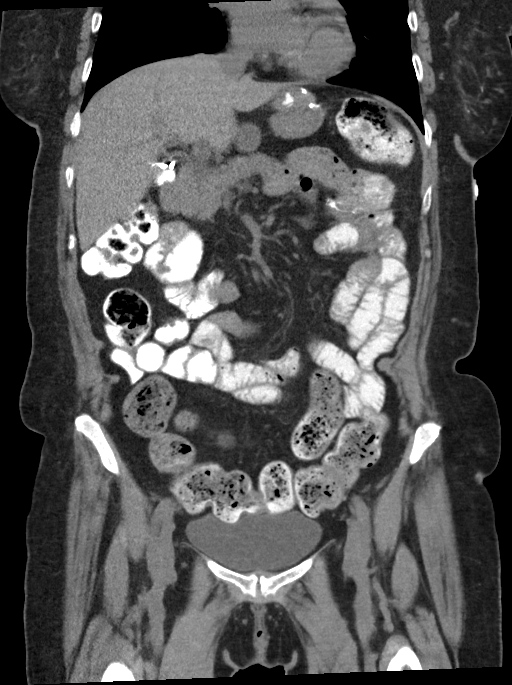
[im 49/88  soft-tissue]
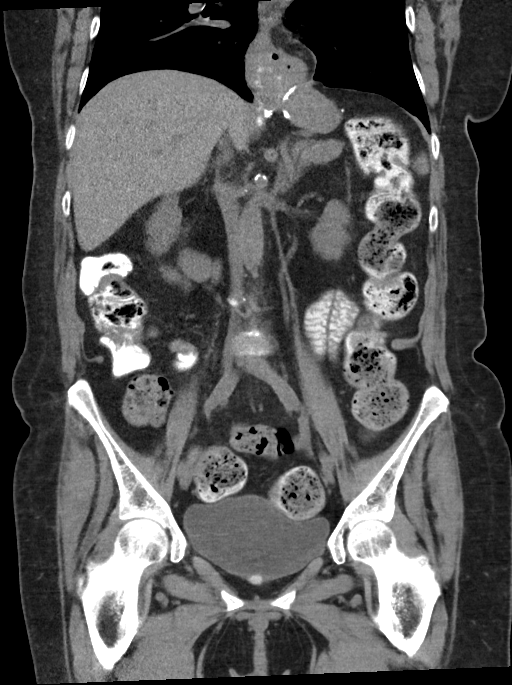

[15 of 46 positions shown; findings below may reference images not displayed]

FINDINGS: Lower chest: No acute abnormality.

Hepatobiliary: No focal liver abnormality is seen. Status post
cholecystectomy. The common bile duct measures approximately 9.3 mm
in diameter.

Pancreas: Unremarkable. No pancreatic ductal dilatation or
surrounding inflammatory changes.

Spleen: Normal in size without focal abnormality.

Adrenals/Urinary Tract: A stable 3.7 cm x 3.3 cm low-attenuation
(approximately 1.35 Hounsfield units) left adrenal mass is seen. The
right adrenal gland is normal in appearance. Kidneys are normal in
size, without obstructing renal calculi, focal lesion, or
hydronephrosis. Bilateral 2 mm and 3 mm nonobstructing renal calculi
are seen. Stable,, likely benign approximately 1.9 cm x 1.5 cm,
cm x 0.6 cm and 1.1 cm x 1.0 cm hyperdense foci are seen along the
base of an otherwise normal appearing urinary bladder.

Stomach/Bowel: There is a small to moderate sized hiatal hernia.
Surgical sutures are seen within the gastric region. Surgically
anastomosed bowel is also noted within the anterior aspect of the
mid left abdomen. Appendix appears normal. No evidence of bowel wall
thickening, distention, or inflammatory changes.

Vascular/Lymphatic: Mild aortic atherosclerosis. No enlarged
abdominal or pelvic lymph nodes.

Reproductive: Uterus and bilateral adnexa are unremarkable.

Other: No abdominal wall hernia or abnormality. No abdominopelvic
ascites.

Musculoskeletal: No acute or significant osseous findings.
IMPRESSION: 1. Bilateral 2 mm and 3 mm nonobstructing renal calculi.
2. Stable left adrenal adenoma.
3. Small to moderate sized hiatal hernia.
4. Evidence of prior gastric bypass surgery.
5. Mild aortic atherosclerosis.

Aortic Atherosclerosis ([id number]-DMT.T).

## 2024-03-14 ENCOUNTER — Encounter (INDEPENDENT_AMBULATORY_CARE_PROVIDER_SITE_OTHER): Payer: Self-pay

## 2024-03-16 ENCOUNTER — Ambulatory Visit
Admission: RE | Admit: 2024-03-16 | Discharge: 2024-03-16 | Disposition: A | Discharge status: Home / Self Care | Attending: Vascular Surgery | Admitting: Vascular Surgery

## 2024-03-16 ENCOUNTER — Other Ambulatory Visit: Payer: Self-pay

## 2024-03-16 ENCOUNTER — Encounter: Payer: Self-pay | Admitting: Vascular Surgery

## 2024-03-16 ENCOUNTER — Encounter
Admission: RE | Disposition: A | Discharge status: Home / Self Care | Payer: Self-pay | Source: Home / Self Care | Attending: Vascular Surgery

## 2024-03-16 DIAGNOSIS — N186 End stage renal disease: Secondary | ICD-10-CM | POA: Diagnosis not present

## 2024-03-16 DIAGNOSIS — Y832 Surgical operation with anastomosis, bypass or graft as the cause of abnormal reaction of the patient, or of later complication, without mention of misadventure at the time of the procedure: Secondary | ICD-10-CM | POA: Diagnosis not present

## 2024-03-16 DIAGNOSIS — Z9884 Bariatric surgery status: Secondary | ICD-10-CM | POA: Insufficient documentation

## 2024-03-16 DIAGNOSIS — I872 Venous insufficiency (chronic) (peripheral): Secondary | ICD-10-CM | POA: Insufficient documentation

## 2024-03-16 DIAGNOSIS — I120 Hypertensive chronic kidney disease with stage 5 chronic kidney disease or end stage renal disease: Secondary | ICD-10-CM | POA: Insufficient documentation

## 2024-03-16 DIAGNOSIS — E1122 Type 2 diabetes mellitus with diabetic chronic kidney disease: Secondary | ICD-10-CM | POA: Diagnosis not present

## 2024-03-16 DIAGNOSIS — Z992 Dependence on renal dialysis: Secondary | ICD-10-CM | POA: Diagnosis not present

## 2024-03-16 DIAGNOSIS — T82858A Stenosis of vascular prosthetic devices, implants and grafts, initial encounter: Secondary | ICD-10-CM | POA: Insufficient documentation

## 2024-03-16 HISTORY — PX: A/V FISTULAGRAM: CATH118298

## 2024-03-16 LAB — POTASSIUM (ARMC VASCULAR LAB ONLY): Potassium (ARMC vascular lab): 3.4 mmol/L — ABNORMAL LOW (ref 3.5–5.1)

## 2024-03-16 LAB — GLUCOSE, CAPILLARY: Glucose-Capillary: 106 mg/dL — ABNORMAL HIGH (ref 70–99)

## 2024-03-16 SURGERY — A/V FISTULAGRAM
Anesthesia: Moderate Sedation | Laterality: Left

## 2024-03-16 MED ORDER — FENTANYL CITRATE PF 50 MCG/ML IJ SOSY
PREFILLED_SYRINGE | INTRAMUSCULAR | Status: AC
  Filled 2024-03-16: qty 1

## 2024-03-16 MED ORDER — MIDAZOLAM HCL 2 MG/ML PO SYRP: 8.0000 mg | ORAL_SOLUTION | Freq: Once | ORAL | Status: DC | PRN

## 2024-03-16 MED ORDER — FENTANYL CITRATE (PF) 100 MCG/2ML IJ SOLN
INTRAMUSCULAR | Status: DC | PRN
  Administered 2024-03-16: 50 ug via INTRAVENOUS
  Administered 2024-03-16: 25 ug via INTRAVENOUS

## 2024-03-16 MED ORDER — MIDAZOLAM HCL 2 MG/2ML IJ SOLN
INTRAMUSCULAR | Status: DC | PRN
  Administered 2024-03-16: 1 mg via INTRAVENOUS
  Administered 2024-03-16: .5 mg via INTRAVENOUS

## 2024-03-16 MED ORDER — HEPARIN SODIUM (PORCINE) 1000 UNIT/ML IJ SOLN
INTRAMUSCULAR | Status: AC
Start: 2024-03-16 — End: ?
  Filled 2024-03-16: qty 10

## 2024-03-16 MED ORDER — ONDANSETRON HCL 4 MG/2ML IJ SOLN: 4.0000 mg | Freq: Four times a day (QID) | INTRAMUSCULAR | Status: DC | PRN

## 2024-03-16 MED ORDER — LIDOCAINE-EPINEPHRINE (PF) 1 %-1:200000 IJ SOLN
INTRAMUSCULAR | Status: DC | PRN
  Administered 2024-03-16: 10 mL

## 2024-03-16 MED ORDER — FAMOTIDINE 20 MG PO TABS: 40.0000 mg | ORAL_TABLET | Freq: Once | ORAL | Status: DC | PRN

## 2024-03-16 MED ORDER — IODIXANOL 320 MG/ML IV SOLN
INTRAVENOUS | Status: DC | PRN
  Administered 2024-03-16: 25 mL

## 2024-03-16 MED ORDER — HEPARIN (PORCINE) IN NACL 1000-0.9 UT/500ML-% IV SOLN
INTRAVENOUS | Status: DC | PRN
  Administered 2024-03-16: 500 mL

## 2024-03-16 MED ORDER — MIDAZOLAM HCL 2 MG/2ML IJ SOLN
INTRAMUSCULAR | Status: AC
  Filled 2024-03-16: qty 4

## 2024-03-16 MED ORDER — DIPHENHYDRAMINE HCL 50 MG/ML IJ SOLN: 50.0000 mg | Freq: Once | INTRAMUSCULAR | Status: DC | PRN

## 2024-03-16 MED ORDER — HEPARIN SODIUM (PORCINE) 1000 UNIT/ML IJ SOLN
INTRAMUSCULAR | Status: DC | PRN
  Administered 2024-03-16: 3000 [IU] via INTRAVENOUS

## 2024-03-16 MED ORDER — METHYLPREDNISOLONE SODIUM SUCC 125 MG IJ SOLR
125.0000 mg | Freq: Once | INTRAMUSCULAR | Status: DC | PRN
Start: 2024-03-16 — End: 2024-03-16

## 2024-03-16 MED ORDER — VANCOMYCIN HCL IN DEXTROSE 1-5 GM/200ML-% IV SOLN
1000.0000 mg | INTRAVENOUS | Status: AC
  Administered 2024-03-16: 1000 mg via INTRAVENOUS

## 2024-03-16 MED ORDER — SODIUM CHLORIDE 0.9 % IV SOLN: INTRAVENOUS | Status: DC

## 2024-03-16 MED ORDER — FENTANYL CITRATE (PF) 100 MCG/2ML IJ SOLN
INTRAMUSCULAR | Status: AC
  Filled 2024-03-16: qty 2

## 2024-03-16 MED ORDER — HYDROMORPHONE HCL 1 MG/ML IJ SOLN: 1.0000 mg | Freq: Once | INTRAMUSCULAR | Status: DC | PRN

## 2024-03-16 MED ORDER — VANCOMYCIN HCL IN DEXTROSE 1-5 GM/200ML-% IV SOLN
INTRAVENOUS | Status: AC
  Filled 2024-03-16: qty 200

## 2024-03-16 SURGICAL SUPPLY — 13 items
BALLOON LUTONIX DCB 7X60X130 (BALLOONS) IMPLANT
COVER PROBE ULTRASOUND 5X96 (MISCELLANEOUS) IMPLANT
DERMABOND ADVANCED .7 DNX12 (GAUZE/BANDAGES/DRESSINGS) IMPLANT
DEVICE PRESTO INFLATION (MISCELLANEOUS) IMPLANT
DRAPE BRACHIAL (DRAPES) IMPLANT
KIT MICROPUNCTURE VSI 5F STIFF (SHEATH) IMPLANT
PACK ANGIOGRAPHY (CUSTOM PROCEDURE TRAY) ×1 IMPLANT
SHEATH BRITE TIP 6FRX5.5 (SHEATH) IMPLANT
SHEATH BRITE TIP 7FRX5.5 (SHEATH) IMPLANT
STENT VIABAHN 8X50X120 (Permanent Stent) IMPLANT
SUT MNCRL AB 4-0 PS2 18 (SUTURE) IMPLANT
WIRE G 018X200 V18 (WIRE) IMPLANT
WIRE SUPRACORE 190CM (WIRE) IMPLANT

## 2024-03-16 NOTE — Interval H&P Note (Signed)
 History and Physical Interval Note:  03/16/2024 12:20 PM  Anita Greene  has presented today for surgery, with the diagnosis of L arm fistulagram   End Stage Renal.  The various methods of treatment have been discussed with the patient and family. After consideration of risks, benefits and other options for treatment, the patient has consented to  Procedure(s): A/V Fistulagram (Left) as a surgical intervention.  The patient's history has been reviewed, patient examined, no change in status, stable for surgery.  I have reviewed the patient's chart and labs.  Questions were answered to the patient's satisfaction.     [name]

## 2024-03-16 NOTE — Op Note (Signed)
 [facility]    OPERATIVE NOTE   PROCEDURE: 1.   Left brachiocephalic arteriovenous fistula cannulation under ultrasound guidance 2.   Left arm fistulagram including central venogram 3.   Percutaneous transluminal angioplasty of the distal upper arm cephalic vein and proximal upper arm cephalic vein with 7 mm diameter Lutonix drug-coated angioplasty balloon inflated twice 4.   Stent placement to the proximal upper arm cephalic vein with 8 mm diameter by 5 cm length Viabahn stent for residual stenosis after angioplasty  PRE-OPERATIVE DIAGNOSIS: 1. ESRD 2. Poorly functional left brachiocephalic AVF  POST-OPERATIVE DIAGNOSIS: same as above   SURGEON: Mikki Alexander, MD  ANESTHESIA: local with MCS  ESTIMATED BLOOD LOSS: 5 cc  FINDING(S): 80 to 85% stenosis of the distal upper arm cephalic vein about 5 cm from the anastomosis.  The vessel was then fairly normal until just before the previously placed stent at the cephalic vein subclavian vein confluence in the proximal upper arm cephalic vein.  There was about a 70 to 80% stenosis in this location.  The central venous circulation was widely patent.  SPECIMEN(S):  None  CONTRAST: 25 cc  FLUORO TIME: 3 minutes  MODERATE CONSCIOUS SEDATION TIME: Approximately 28 minutes with 1.5 mg of Versed  and 75 mcg of Fentanyl    INDICATIONS: Anita Greene is a 73 y.o. female who presents with malfunctioning left brachiocephalic arteriovenous fistula.  The patient is scheduled for left arm fistulagram.  The patient is aware the risks include but are not limited to: bleeding, infection, thrombosis of the cannulated access, and possible anaphylactic reaction to the contrast.  The patient is aware of the risks of the procedure and elects to proceed forward.  DESCRIPTION: After full informed written consent was obtained, the patient was brought back to the angiography suite and placed supine upon the angiography table.  The patient  was connected to monitoring equipment. Moderate conscious sedation was administered with a face to face encounter with the patient throughout the procedure with my supervision of the RN administering medicines and monitoring the patient's vital signs and mental status throughout from the start of the procedure until the patient was taken to the recovery room. The left arm was prepped and draped in the standard fashion for a percutaneous access intervention.  Under ultrasound guidance, the left brachiocephalic arteriovenous fistula was cannulated with a micropuncture needle under direct ultrasound guidance where it was patent and a permanent image was performed.  The microwire was advanced into the fistula and the needle was exchanged for the a microsheath.  I then upsized to a 6 Fr Sheath and imaging was performed.  Hand injections were completed to image the access including the central venous system. This demonstrated 80 to 85% stenosis of the distal upper arm cephalic vein about 5 cm from the anastomosis.  The vessel was then fairly normal until just before the previously placed stent at the cephalic vein subclavian vein confluence in the proximal upper arm cephalic vein.  There was about a 70 to 80% stenosis in this location.  The central venous circulation was widely patent..  Based on the images, this patient will need intervention to both areas of stenosis. I then gave the patient 3000 units of intravenous heparin .  I then crossed the stenosis with a supra core wire.  Based on the imaging, a 7 mm x 6 cm Lutonix drug-coated angioplasty balloon was selected.  The balloon was centered around the proximal upper arm cephalic vein stenosis and inflated  to 10 ATM for 1 minute(s).  Was then pulled back to the distal upper arm cephalic vein and inflated to 8 atm for 1 minute.  On completion imaging, a 25-30% residual stenosis was present in the distal upper arm cephalic vein but about a 60% stenosis remained in the  proximal upper arm cephalic vein.  I then exchanged for 7 French sheath and a V18 wire and treated this lesion with a Viabahn stent.  An 8 mm diameter by 5 cm length Viabahn stent was deployed in the proximal upper arm cephalic vein and postdilated with a 7 mm balloon with excellent angiographic completion result and less than 10% residual stenosis.     Based on the completion imaging, no further intervention is necessary.  The wire and balloon were removed from the sheath.  A 4-0 Monocryl purse-string suture was sewn around the sheath.  The sheath was removed while tying down the suture.  A sterile bandage was applied to the puncture site.  COMPLICATIONS: None  CONDITION: Stable   Mikki Alexander  03/16/2024 1:00 PM   This note was created with Dragon Medical transcription system. Any errors in dictation are purely unintentional.

## 2024-03-17 ENCOUNTER — Encounter: Payer: Self-pay | Admitting: Vascular Surgery

## 2024-03-22 DIAGNOSIS — G4733 Obstructive sleep apnea (adult) (pediatric): Secondary | ICD-10-CM | POA: Diagnosis not present

## 2024-03-25 DIAGNOSIS — Z992 Dependence on renal dialysis: Secondary | ICD-10-CM | POA: Diagnosis not present

## 2024-03-25 DIAGNOSIS — N186 End stage renal disease: Secondary | ICD-10-CM | POA: Diagnosis not present

## 2024-03-26 DIAGNOSIS — Z992 Dependence on renal dialysis: Secondary | ICD-10-CM | POA: Diagnosis not present

## 2024-03-26 DIAGNOSIS — D509 Iron deficiency anemia, unspecified: Secondary | ICD-10-CM | POA: Diagnosis not present

## 2024-03-26 DIAGNOSIS — N186 End stage renal disease: Secondary | ICD-10-CM | POA: Diagnosis not present

## 2024-03-26 DIAGNOSIS — D631 Anemia in chronic kidney disease: Secondary | ICD-10-CM | POA: Diagnosis not present

## 2024-03-27 ENCOUNTER — Telehealth: Payer: Self-pay | Admitting: Internal Medicine

## 2024-03-27 NOTE — Telephone Encounter (Signed)
 Received request for cpap office notes from [employer]. Faxed back stating patient sees [place]

## 2024-04-24 DIAGNOSIS — Z992 Dependence on renal dialysis: Secondary | ICD-10-CM | POA: Diagnosis not present

## 2024-04-24 DIAGNOSIS — N186 End stage renal disease: Secondary | ICD-10-CM | POA: Diagnosis not present

## 2024-04-25 DIAGNOSIS — N186 End stage renal disease: Secondary | ICD-10-CM | POA: Diagnosis not present

## 2024-04-25 DIAGNOSIS — D631 Anemia in chronic kidney disease: Secondary | ICD-10-CM | POA: Diagnosis not present

## 2024-04-25 DIAGNOSIS — D509 Iron deficiency anemia, unspecified: Secondary | ICD-10-CM | POA: Diagnosis not present

## 2024-04-25 DIAGNOSIS — Z992 Dependence on renal dialysis: Secondary | ICD-10-CM | POA: Diagnosis not present

## 2024-04-27 ENCOUNTER — Ambulatory Visit (INDEPENDENT_AMBULATORY_CARE_PROVIDER_SITE_OTHER): Admitting: Vascular Surgery

## 2024-04-27 ENCOUNTER — Encounter (INDEPENDENT_AMBULATORY_CARE_PROVIDER_SITE_OTHER)

## 2024-05-04 DIAGNOSIS — H4311 Vitreous hemorrhage, right eye: Secondary | ICD-10-CM | POA: Diagnosis not present

## 2024-05-04 DIAGNOSIS — E119 Type 2 diabetes mellitus without complications: Secondary | ICD-10-CM | POA: Diagnosis not present

## 2024-05-04 DIAGNOSIS — H4423 Degenerative myopia, bilateral: Secondary | ICD-10-CM | POA: Diagnosis not present

## 2024-05-11 DIAGNOSIS — E119 Type 2 diabetes mellitus without complications: Secondary | ICD-10-CM | POA: Diagnosis not present

## 2024-05-11 DIAGNOSIS — H4311 Vitreous hemorrhage, right eye: Secondary | ICD-10-CM | POA: Diagnosis not present

## 2024-05-11 DIAGNOSIS — H33311 Horseshoe tear of retina without detachment, right eye: Secondary | ICD-10-CM | POA: Diagnosis not present

## 2024-05-22 DIAGNOSIS — G4733 Obstructive sleep apnea (adult) (pediatric): Secondary | ICD-10-CM | POA: Diagnosis not present

## 2024-05-25 DIAGNOSIS — Z992 Dependence on renal dialysis: Secondary | ICD-10-CM | POA: Diagnosis not present

## 2024-05-25 DIAGNOSIS — N186 End stage renal disease: Secondary | ICD-10-CM | POA: Diagnosis not present

## 2024-05-26 DIAGNOSIS — N186 End stage renal disease: Secondary | ICD-10-CM | POA: Diagnosis not present

## 2024-05-26 DIAGNOSIS — D631 Anemia in chronic kidney disease: Secondary | ICD-10-CM | POA: Diagnosis not present

## 2024-05-26 DIAGNOSIS — Z992 Dependence on renal dialysis: Secondary | ICD-10-CM | POA: Diagnosis not present

## 2024-05-26 DIAGNOSIS — D509 Iron deficiency anemia, unspecified: Secondary | ICD-10-CM | POA: Diagnosis not present

## 2024-06-07 ENCOUNTER — Ambulatory Visit (INDEPENDENT_AMBULATORY_CARE_PROVIDER_SITE_OTHER): Admitting: Nurse Practitioner

## 2024-06-07 ENCOUNTER — Encounter (INDEPENDENT_AMBULATORY_CARE_PROVIDER_SITE_OTHER)

## 2024-06-13 DIAGNOSIS — K2100 Gastro-esophageal reflux disease with esophagitis, without bleeding: Secondary | ICD-10-CM | POA: Diagnosis not present

## 2024-06-13 DIAGNOSIS — G4733 Obstructive sleep apnea (adult) (pediatric): Secondary | ICD-10-CM | POA: Diagnosis not present

## 2024-06-13 DIAGNOSIS — Z992 Dependence on renal dialysis: Secondary | ICD-10-CM | POA: Diagnosis not present

## 2024-06-13 DIAGNOSIS — N186 End stage renal disease: Secondary | ICD-10-CM | POA: Diagnosis not present

## 2024-06-13 DIAGNOSIS — I69351 Hemiplegia and hemiparesis following cerebral infarction affecting right dominant side: Secondary | ICD-10-CM | POA: Diagnosis not present

## 2024-06-13 DIAGNOSIS — Z0000 Encounter for general adult medical examination without abnormal findings: Secondary | ICD-10-CM | POA: Diagnosis not present

## 2024-06-13 DIAGNOSIS — I10 Essential (primary) hypertension: Secondary | ICD-10-CM | POA: Diagnosis not present

## 2024-06-13 DIAGNOSIS — E119 Type 2 diabetes mellitus without complications: Secondary | ICD-10-CM | POA: Diagnosis not present

## 2024-06-20 ENCOUNTER — Ambulatory Visit (INDEPENDENT_AMBULATORY_CARE_PROVIDER_SITE_OTHER): Admitting: Nurse Practitioner

## 2024-06-20 ENCOUNTER — Encounter (INDEPENDENT_AMBULATORY_CARE_PROVIDER_SITE_OTHER)

## 2024-06-22 DIAGNOSIS — G4733 Obstructive sleep apnea (adult) (pediatric): Secondary | ICD-10-CM | POA: Diagnosis not present

## 2024-06-25 DIAGNOSIS — N186 End stage renal disease: Secondary | ICD-10-CM | POA: Diagnosis not present

## 2024-06-25 DIAGNOSIS — Z992 Dependence on renal dialysis: Secondary | ICD-10-CM | POA: Diagnosis not present

## 2024-06-26 DIAGNOSIS — Z23 Encounter for immunization: Secondary | ICD-10-CM | POA: Diagnosis not present

## 2024-06-26 DIAGNOSIS — N186 End stage renal disease: Secondary | ICD-10-CM | POA: Diagnosis not present

## 2024-06-26 DIAGNOSIS — D509 Iron deficiency anemia, unspecified: Secondary | ICD-10-CM | POA: Diagnosis not present

## 2024-06-26 DIAGNOSIS — Z992 Dependence on renal dialysis: Secondary | ICD-10-CM | POA: Diagnosis not present

## 2024-06-26 DIAGNOSIS — D631 Anemia in chronic kidney disease: Secondary | ICD-10-CM | POA: Diagnosis not present

## 2024-06-28 DIAGNOSIS — E119 Type 2 diabetes mellitus without complications: Secondary | ICD-10-CM | POA: Diagnosis not present

## 2024-06-28 DIAGNOSIS — H4311 Vitreous hemorrhage, right eye: Secondary | ICD-10-CM | POA: Diagnosis not present

## 2024-06-28 DIAGNOSIS — H4423 Degenerative myopia, bilateral: Secondary | ICD-10-CM | POA: Diagnosis not present

## 2024-07-04 ENCOUNTER — Other Ambulatory Visit (INDEPENDENT_AMBULATORY_CARE_PROVIDER_SITE_OTHER): Payer: Self-pay | Admitting: Vascular Surgery

## 2024-07-04 DIAGNOSIS — N186 End stage renal disease: Secondary | ICD-10-CM

## 2024-07-06 ENCOUNTER — Ambulatory Visit (INDEPENDENT_AMBULATORY_CARE_PROVIDER_SITE_OTHER): Admitting: Nurse Practitioner

## 2024-07-06 ENCOUNTER — Encounter (INDEPENDENT_AMBULATORY_CARE_PROVIDER_SITE_OTHER): Payer: Self-pay | Admitting: Nurse Practitioner

## 2024-07-06 ENCOUNTER — Other Ambulatory Visit (INDEPENDENT_AMBULATORY_CARE_PROVIDER_SITE_OTHER)

## 2024-07-06 VITALS — BP 108/70 | HR 73 | Ht 60.0 in | Wt 141.0 lb

## 2024-07-06 DIAGNOSIS — N186 End stage renal disease: Secondary | ICD-10-CM

## 2024-07-06 DIAGNOSIS — E1149 Type 2 diabetes mellitus with other diabetic neurological complication: Secondary | ICD-10-CM

## 2024-07-06 DIAGNOSIS — I10 Essential (primary) hypertension: Secondary | ICD-10-CM

## 2024-07-10 ENCOUNTER — Encounter (INDEPENDENT_AMBULATORY_CARE_PROVIDER_SITE_OTHER): Payer: Self-pay | Admitting: Nurse Practitioner

## 2024-07-10 NOTE — Progress Notes (Signed)
 Subjective:    Patient ID: Anita Greene, female    DOB: [birth year], 73 y.o.   MRN: [id number] Chief Complaint  Patient presents with   Follow-up    fu 6 weeks + HDA see FB- pt states she is doing ok and is fine with pushing appt out (She has transporation issues and couldn't come in last scheduled appt) Having pain on left elbow she is not sure if it coming from dialysis     The patient returns to the office for followup of their dialysis access.   The patient reports the function of the access has been stable. Patient denies difficulty with cannulation. The patient denies increased bleeding time after removing the needles. The patient denies hand pain or other symptoms consistent with steal phenomena.  No significant arm swelling.  The patient denies any complaints from the dialysis center or their nephrologist.  The patient denies redness or swelling at the access site. The patient denies fever or chills at home or while on dialysis.  No recent shortening of the patient's walking distance or new symptoms consistent with claudication.  No history of rest pain symptoms. No new ulcers or wounds of the lower extremities have occurred.  The patient denies amaurosis fugax or recent TIA symptoms. There are no recent neurological changes noted. There is no history of DVT, PE or superficial thrombophlebitis. No recent episodes of angina or shortness of breath documented.   Duplex ultrasound of the AV access shows a patent access.  The previously noted stenosis is not significantly changed compared to last study.  Flow volume today is 983 cc/min (previous flow volume was 1285 cc/min)      Review of Systems  All other systems reviewed and are negative.      Objective:   Physical Exam Vitals reviewed.  HENT:     Head: Normocephalic.  Cardiovascular:     Rate and Rhythm: Normal rate.     Pulses:          Femoral pulses are 1+ on the left side.    Arteriovenous access: Left  arteriovenous access is present.     Comments: Good thrill and bruit Pulmonary:     Effort: Pulmonary effort is normal.  Skin:    General: Skin is warm and dry.  Neurological:     Mental Status: She is alert and oriented to person, place, and time.  Psychiatric:        Mood and Affect: Mood normal.        Behavior: Behavior normal.        Thought Content: Thought content normal.        Judgment: Judgment normal.     BP 108/70   Pulse 73   Ht 5' (1.524 m)   Wt 141 lb (64 kg)   BMI 27.54 kg/m   Past Medical History:  Diagnosis Date   Allergic rhinitis due to pollen    Arthritis    B12 deficiency    Cataract cortical, senile    Chronic kidney disease    Colon polyp    Depression    End stage chronic kidney disease (HCC)    Fibromyalgia    GERD (gastroesophageal reflux disease)    Hemiparesis affecting right side as late effect of cerebrovascular accident (CVA) (HCC)    Hyperlipidemia    Hypertension    IBS (irritable bowel syndrome)    diarrhea prone   Iron deficiency anemia    Irritable bowel syndrome with diarrhea  Macular degeneration of left eye    Migraine headache    Mood disorder (HCC)    Obstructive sleep apnea    Optic atrophy of right eye    Osteoporosis    Paraesophageal hernia    PFO (patent foramen ovale)    Pneumonia    RLS (restless legs syndrome)    Secondary hyperparathyroidism, renal (HCC)    Sleep disturbance    Stroke (HCC)    Type 2 diabetes mellitus with neurological manifestations, controlled (HCC)     Social History   Socioeconomic History   Marital status: Widowed    Spouse name: Not on file   Number of children: 2   Years of education: Not on file   Highest education level: Not on file  Occupational History   Occupation: [employer]  Tobacco Use   Smoking status: Never   Smokeless tobacco: Never  Vaping Use   Vaping status: Never Used  Substance and Sexual Activity   Alcohol use: Never    Alcohol/week: 0.0  standard drinks of alcohol   Drug use: Never   Sexual activity: Not on file  Other Topics Concern   Not on file  Social History Narrative   1 son died    1 son living--2 grandchildren      Had living will--not sure about it   No formal health care POA--would want son   Would accept resuscitation   No prolonged tube feeds if cognitively unaware   Social Drivers of Health   Financial Resource Strain: Low Risk  (06/13/2024)   Received from [facility]   Overall Financial Resource Strain (CARDIA)    Difficulty of Paying Living Expenses: Not very hard  Food Insecurity: Food Insecurity Present (06/13/2024)   Received from [facility]   Hunger Vital Sign    Within the past 12 months, you worried that your food would run out before you got the money to buy more.: Sometimes true    Within the past 12 months, the food you bought just didn't last and you didn't have money to get more.: Never true  Transportation Needs: Unmet Transportation Needs (06/13/2024)   Received from [facility] - Transportation    In the past 12 months, has lack of transportation kept you from medical appointments or from getting medications?: Yes    Lack of Transportation (Non-Medical): Yes  Physical Activity: Not on file  Stress: No Stress Concern Present (01/13/2023)   Received from [facility] - Occupational Stress Questionnaire    Feeling of Stress : Not at all  Social Connections: Unknown (12/31/2022)   Received from [facility]   Social Network    Social Network: Not on file  Intimate Partner Violence: Unknown (12/31/2022)   Received from [facility]   HITS    Physically Hurt: Not on file    Insult or Talk Down To: Not on file    Threaten Physical Harm: Not on file    Scream or Curse: Not on file    Past Surgical History:  Procedure Laterality Date   A/V FISTULAGRAM Left 06/03/2023   Procedure:  A/V Fistulagram;  Surgeon: Marea Selinda RAMAN, MD;  Location: [facility];  Service: Cardiovascular;  Laterality: Left;   A/V FISTULAGRAM Left 08/16/2023   Procedure: A/V Fistulagram;  Surgeon: Marea Selinda RAMAN, MD;  Location: [facility];  Service: Cardiovascular;  Laterality: Left;  A/V FISTULAGRAM Left 03/16/2024   Procedure: A/V Fistulagram;  Surgeon: Marea Selinda RAMAN, MD;  Location: [facility];  Service: Cardiovascular;  Laterality: Left;   AV FISTULA PLACEMENT Left 04/14/2023   Procedure: ARTERIOVENOUS (AV) FISTULA CREATION;  Surgeon: Marea Selinda RAMAN, MD;  Location: [facility] ORS;  Service: Vascular;  Laterality: Left;   CATARACT EXTRACTION W/ INTRAOCULAR LENS  IMPLANT, BILATERAL     CHOLECYSTECTOMY     COLONOSCOPY     COLONOSCOPY WITH PROPOFOL  N/A 04/04/2018   Procedure: COLONOSCOPY WITH PROPOFOL ;  Surgeon: Gaylyn Gladis PENNER, MD;  Location: [facility];  Service: Endoscopy;  Laterality: N/A;   DIALYSIS/PERMA CATHETER INSERTION N/A 01/23/2022   Procedure: DIALYSIS/PERMA CATHETER INSERTION;  Surgeon: Marea Selinda RAMAN, MD;  Location: [facility];  Service: Cardiovascular;  Laterality: N/A;   DIALYSIS/PERMA CATHETER INSERTION N/A 01/28/2023   Procedure: DIALYSIS/PERMA CATHETER INSERTION;  Surgeon: Marea Selinda RAMAN, MD;  Location: [facility];  Service: Cardiovascular;  Laterality: N/A;   DIALYSIS/PERMA CATHETER REMOVAL N/A 06/15/2022   Procedure: DIALYSIS/PERMA CATHETER REMOVAL;  Surgeon: Marea Selinda RAMAN, MD;  Location: [facility];  Service: Cardiovascular;  Laterality: N/A;   DIALYSIS/PERMA CATHETER REMOVAL N/A 07/06/2023   Procedure: DIALYSIS/PERMA CATHETER REMOVAL;  Surgeon: Jama Cordella MATSU, MD;  Location: [facility];  Service: Cardiovascular;  Laterality: N/A;   ESOPHAGOGASTRODUODENOSCOPY (EGD) WITH PROPOFOL  N/A 04/04/2018   Procedure: ESOPHAGOGASTRODUODENOSCOPY (EGD) WITH PROPOFOL ;  Surgeon: Gaylyn Gladis PENNER, MD;  Location: [facility] ENDOSCOPY;   Service: Endoscopy;  Laterality: N/A;   HIATAL HERNIA REPAIR  1990   NISSEN FUNDOPLICATION  2021   PERITONEAL CATHETER INSERTION  04/09/2022   PERITONEAL CATHETER REMOVAL  02/2023   ROUX-EN-Y GASTRIC BYPASS  2021   TONSILLECTOMY AND ADENOIDECTOMY     TUBAL LIGATION     UPPER GASTROINTESTINAL ENDOSCOPY     Uroplasty  ~2000's   Dr Twylla    Family History  Problem Relation Age of Onset   Cancer Mother        lung cancer   Parkinson's disease Father    Dementia Father    COPD Father    Heart disease Father    Cancer Sister        lung cancer   Cancer Son        Ewing's sarcoma   Breast cancer Neg Hx     Allergies  Allergen Reactions   Cefditoren Pivoxil     Other reaction(s): Other (See Comments) GI issues, severe abdominal pain   Clarithromycin     Other reaction(s): Other (See Comments) GI issues   Codeine Sulfate Itching   Erythromycin Other (See Comments)    GI issues   Other Other (See Comments)    Oral iron   Pseudoephedrine Other (See Comments)    Altered mental status   Sulfa Antibiotics Itching   Tape     Pt states she can only use silicone tape       Latest Ref Rng & Units 04/14/2023   10:25 AM 04/12/2023    3:43 PM 01/12/2023   11:43 AM  CBC  WBC 4.0 - 10.5 K/uL  7.3  10.1   Hemoglobin 12.0 - 15.0 g/dL 86.0  86.1  87.9   Hematocrit 36.0 - 46.0 % 41.0  42.9  39.4   Platelets 150 - 400 K/uL  238  392       CMP     Component Value Date/Time   NA 139 04/14/2023 1025  K 4.6 06/03/2023 1237   CL 106 04/14/2023 1025   CO2 22 04/12/2023 1543   GLUCOSE 99 04/14/2023 1025   BUN 46 (H) 04/14/2023 1025   CREATININE 4.80 (H) 04/14/2023 1025   CALCIUM  8.3 (L) 04/12/2023 1543   PROT 7.0 01/21/2022 1036   ALBUMIN 3.4 (L) 01/12/2023 1143   AST 20 01/21/2022 1036   ALT 15 01/21/2022 1036   ALKPHOS 88 01/21/2022 1036   BILITOT 0.7 01/21/2022 1036   GFR 40.72 (L) 01/01/2017 1432   GFRNONAA 25 (L) 04/12/2023 1543     No results found.      Assessment & Plan:   1. ESRD (end stage renal disease) (HCC) (Primary) Recommend:  The patient is doing well and currently has adequate dialysis access.  Although there are some parameters suggesting possible future issues.  The patient's dialysis center is not reporting any major access issues.  However, the flow rates in the patient's dialysis access are low and there is no exact stenosis identified.  This raises concerns that the access is at moderate but not high risk for a problem or thrombosis and should be followed more closely  The patient will follow-up with me in the office in 3 months.  The need for a follow up duplex ultrasound will be made at that time based on whether problems with the access are persistent.    2. Essential hypertension Continue antihypertensive medications as already ordered, these medications have been reviewed and there are no changes at this time.  3. Type 2 diabetes mellitus with neurological manifestations, controlled (HCC) Continue hypoglycemic medications as already ordered, these medications have been reviewed and there are no changes at this time.  Hgb A1C to be monitored as already arranged by primary service   Current Outpatient Medications on File Prior to Visit  Medication Sig Dispense Refill   aspirin  EC 81 MG tablet Take 81 mg by mouth at bedtime.     buPROPion  (WELLBUTRIN  XL) 150 MG 24 hr tablet Take 150 mg by mouth at bedtime.     busPIRone  (BUSPAR ) 15 MG tablet Take 15 mg by mouth 3 (three) times daily.     calcium  acetate (PHOSLO ) 667 MG tablet Take 1,334 mg by mouth 3 (three) times daily.     carvedilol (COREG) 6.25 MG tablet Take 6.25 mg by mouth 2 (two) times daily with a meal.     cetirizine  (ZYRTEC ) 10 MG tablet Take 1 tablet (10 mg total) by mouth daily. 90 tablet 3   cholecalciferol (VITAMIN D3) 10 MCG (400 UNIT) TABS tablet Take 2,000 Units by mouth daily.     cyclobenzaprine  (FLEXERIL ) 10 MG tablet Take 1 tablet by mouth 3  (three) times daily as needed.     diphenoxylate -atropine  (LOMOTIL ) 2.5-0.025 MG tablet TAKE 1 TABLET BY MOUTH 4 TIMES DAILY AS NEEDED FOR DIARRHEA OR LOOSE STOOLS. 90 tablet 0   DULoxetine  (CYMBALTA ) 30 MG capsule Take 1 capsule (30 mg total) by mouth daily. 90 capsule 3   famotidine  (PEPCID ) 20 MG tablet Take 20 mg by mouth daily.     furosemide (LASIX) 40 MG tablet Take 40 mg by mouth daily. [id number]     gabapentin  (NEURONTIN ) 100 MG capsule Take 100 mg by mouth at bedtime.     Lifitegrast (XIIDRA) 5 % SOLN Apply to eye.     magnesium  gluconate (MAGONATE) 500 MG tablet Take 500 mg by mouth daily.     midodrine  (PROAMATINE ) 10 MG tablet Take 10 mg by mouth  3 (three) times daily as needed.     MIEBO 1.338 GM/ML SOLN      Multiple Vitamins-Minerals (ICAPS) TABS Take 1 tablet by mouth daily.     multivitamin (RENA-VIT) TABS tablet Take 1 tablet by mouth at bedtime.     ondansetron  (ZOFRAN ) 4 MG tablet Take by mouth as needed.     sennosides-docusate sodium (SENOKOT-S) 8.6-50 MG tablet Take 1 tablet by mouth daily as needed. Do not take this med within 2 hours of other meds     silver  sulfADIAZINE  (SILVADENE ) 1 % cream Apply topically continuously as needed     spironolactone (ALDACTONE) 25 MG tablet Take 25 mg by mouth daily.     valsartan (DIOVAN) 160 MG tablet Take 160 mg by mouth daily.     vitamin B-12 (CYANOCOBALAMIN) 500 MCG tablet Take 500 mcg by mouth daily.     zolpidem  (AMBIEN ) 5 MG tablet Take 5 mg by mouth at bedtime as needed for sleep.     Magnesium  Oxide -Mg Supplement 500 MG TABS Take by mouth. (Patient not taking: Reported on 07/06/2024)     potassium chloride  SA (KLOR-CON  M) 20 MEQ tablet Take 2 tablets (40 mEq) today, then 1 tablet (20 mEq) BID tomorrow, then 1 tablet (20 mEq) on the day of surgery. Follow up with PCP/nephrology for repeat labs. (Patient not taking: Reported on 07/06/2024) 5 tablet 0   verapamil  (CALAN ) 40 MG tablet Take 20 mg by mouth as directed. Tuesday,  Wednesday, Thursday,Saturday,Sunday (Patient not taking: Reported on 07/06/2024)     Current Facility-Administered Medications on File Prior to Visit  Medication Dose Route Frequency Provider Last Rate Last Admin   lidocaine -EPINEPHrine  (PF) (XYLOCAINE -EPINEPHrine ) 1 %-1:200000 (PF) injection    PRN Dew, Jason S, MD   10 mL at 06/03/23 1345    There are no Patient Instructions on file for this visit. No follow-ups on file.   Melysa Schroyer E Brett Darko, NP

## 2024-07-11 DIAGNOSIS — H4423 Degenerative myopia, bilateral: Secondary | ICD-10-CM | POA: Diagnosis not present

## 2024-07-11 DIAGNOSIS — H33311 Horseshoe tear of retina without detachment, right eye: Secondary | ICD-10-CM | POA: Diagnosis not present

## 2024-07-11 DIAGNOSIS — Z961 Presence of intraocular lens: Secondary | ICD-10-CM | POA: Diagnosis not present

## 2024-07-11 DIAGNOSIS — H4311 Vitreous hemorrhage, right eye: Secondary | ICD-10-CM | POA: Diagnosis not present

## 2024-07-23 DIAGNOSIS — G4733 Obstructive sleep apnea (adult) (pediatric): Secondary | ICD-10-CM | POA: Diagnosis not present

## 2024-07-25 DIAGNOSIS — Z992 Dependence on renal dialysis: Secondary | ICD-10-CM | POA: Diagnosis not present

## 2024-07-25 DIAGNOSIS — N186 End stage renal disease: Secondary | ICD-10-CM | POA: Diagnosis not present

## 2024-07-26 DIAGNOSIS — N186 End stage renal disease: Secondary | ICD-10-CM | POA: Diagnosis not present

## 2024-07-26 DIAGNOSIS — Z23 Encounter for immunization: Secondary | ICD-10-CM | POA: Diagnosis not present

## 2024-07-26 DIAGNOSIS — D509 Iron deficiency anemia, unspecified: Secondary | ICD-10-CM | POA: Diagnosis not present

## 2024-07-26 DIAGNOSIS — Z992 Dependence on renal dialysis: Secondary | ICD-10-CM | POA: Diagnosis not present

## 2024-07-26 DIAGNOSIS — D631 Anemia in chronic kidney disease: Secondary | ICD-10-CM | POA: Diagnosis not present

## 2024-08-17 DIAGNOSIS — H4311 Vitreous hemorrhage, right eye: Secondary | ICD-10-CM | POA: Diagnosis not present

## 2024-08-17 DIAGNOSIS — H4423 Degenerative myopia, bilateral: Secondary | ICD-10-CM | POA: Diagnosis not present

## 2024-08-17 DIAGNOSIS — Z961 Presence of intraocular lens: Secondary | ICD-10-CM | POA: Diagnosis not present

## 2024-08-25 DIAGNOSIS — Z992 Dependence on renal dialysis: Secondary | ICD-10-CM | POA: Diagnosis not present

## 2024-08-25 DIAGNOSIS — N186 End stage renal disease: Secondary | ICD-10-CM | POA: Diagnosis not present

## 2024-08-26 DIAGNOSIS — D631 Anemia in chronic kidney disease: Secondary | ICD-10-CM | POA: Diagnosis not present

## 2024-08-26 DIAGNOSIS — D509 Iron deficiency anemia, unspecified: Secondary | ICD-10-CM | POA: Diagnosis not present

## 2024-08-26 DIAGNOSIS — Z992 Dependence on renal dialysis: Secondary | ICD-10-CM | POA: Diagnosis not present

## 2024-09-22 DIAGNOSIS — G4733 Obstructive sleep apnea (adult) (pediatric): Secondary | ICD-10-CM | POA: Diagnosis not present

## 2024-09-24 DIAGNOSIS — Z992 Dependence on renal dialysis: Secondary | ICD-10-CM | POA: Diagnosis not present

## 2024-09-24 DIAGNOSIS — N186 End stage renal disease: Secondary | ICD-10-CM | POA: Diagnosis not present

## 2024-09-27 ENCOUNTER — Other Ambulatory Visit (INDEPENDENT_AMBULATORY_CARE_PROVIDER_SITE_OTHER): Payer: Self-pay | Admitting: Nurse Practitioner

## 2024-09-27 DIAGNOSIS — N186 End stage renal disease: Secondary | ICD-10-CM

## 2024-10-05 ENCOUNTER — Other Ambulatory Visit (INDEPENDENT_AMBULATORY_CARE_PROVIDER_SITE_OTHER)

## 2024-10-05 ENCOUNTER — Encounter (INDEPENDENT_AMBULATORY_CARE_PROVIDER_SITE_OTHER): Payer: Self-pay | Admitting: Nurse Practitioner

## 2024-10-05 ENCOUNTER — Ambulatory Visit (INDEPENDENT_AMBULATORY_CARE_PROVIDER_SITE_OTHER): Admitting: Nurse Practitioner

## 2024-10-05 VITALS — BP 101/62 | HR 71 | Resp 17 | Ht 60.0 in | Wt 140.2 lb

## 2024-10-05 DIAGNOSIS — E1149 Type 2 diabetes mellitus with other diabetic neurological complication: Secondary | ICD-10-CM | POA: Diagnosis not present

## 2024-10-05 DIAGNOSIS — N186 End stage renal disease: Secondary | ICD-10-CM

## 2024-10-05 DIAGNOSIS — I10 Essential (primary) hypertension: Secondary | ICD-10-CM

## 2024-10-16 ENCOUNTER — Encounter (INDEPENDENT_AMBULATORY_CARE_PROVIDER_SITE_OTHER): Payer: Self-pay | Admitting: Nurse Practitioner

## 2024-10-16 NOTE — H&P (View-Only) (Signed)
 "  Subjective:    Patient ID: Anita Greene, female    DOB: 1951/09/08, 73 y.o.   MRN: [id number] Chief Complaint  Patient presents with   Follow-up    3 months + HDA     HPI  Discussed the use of AI scribe software for clinical note transcription with the patient, who gave verbal consent to proceed.  History of Present Illness Anita Greene is a 73 year old female who presents for evaluation of her dialysis access.  She experiences occasional bleeding from her dialysis access site, occurring approximately once a month, which she attributes to the manner of needle insertion.  She has experienced clotting at the access site a couple of times, leading to an increase in her heparin  dosage. She is currently undergoing dialysis twice a week, on Mondays and Fridays.  She mentions that she bruises easily, although the last procedure did not result in significant bruising, possibly due to the care taken by the medical staff.    Results DIAGNOSTIC Flow volume measurement: 1241 with multiple stenotic areas noted   Review of Systems  Hematological:  Bruises/bleeds easily.  All other systems reviewed and are negative.      Objective:   Physical Exam Vitals reviewed.  HENT:     Head: Normocephalic.  Cardiovascular:     Rate and Rhythm: Normal rate.     Pulses: Normal pulses.  Pulmonary:     Effort: Pulmonary effort is normal.  Skin:    General: Skin is warm and dry.  Neurological:     Mental Status: She is alert and oriented to person, place, and time.  Psychiatric:        Mood and Affect: Mood normal.        Behavior: Behavior normal.        Thought Content: Thought content normal.        Judgment: Judgment normal.     Physical Exam    BP 101/62   Pulse 71   Resp 17   Ht 5' (1.524 m)   Wt 140 lb 3.2 oz (63.6 kg)   BMI 27.38 kg/m   Past Medical History:  Diagnosis Date   Allergic rhinitis due to pollen    Arthritis    B12 deficiency    Cataract  cortical, senile    Chronic kidney disease    Colon polyp    Depression    End stage chronic kidney disease (HCC)    Fibromyalgia    GERD (gastroesophageal reflux disease)    Hemiparesis affecting right side as late effect of cerebrovascular accident (CVA) (HCC)    Hyperlipidemia    Hypertension    IBS (irritable bowel syndrome)    diarrhea prone   Iron deficiency anemia    Irritable bowel syndrome with diarrhea    Macular degeneration of left eye    Migraine headache    Mood disorder    Obstructive sleep apnea    Optic atrophy of right eye    Osteoporosis    Paraesophageal hernia    PFO (patent foramen ovale)    Pneumonia    RLS (restless legs syndrome)    Secondary hyperparathyroidism, renal    Sleep disturbance    Stroke (HCC)    Type 2 diabetes mellitus with neurological manifestations, controlled (HCC)     Social History   Socioeconomic History   Marital status: Widowed    Spouse name: Not on file   Number of children: 2   Years of  education: Not on file   Highest education level: Not on file  Occupational History   Occupation: [employer]  Tobacco Use   Smoking status: Never   Smokeless tobacco: Never  Vaping Use   Vaping status: Never Used  Substance and Sexual Activity   Alcohol use: Never    Alcohol/week: 0.0 standard drinks of alcohol   Drug use: Never   Sexual activity: Not on file  Other Topics Concern   Not on file  Social History Narrative   1 son died    1 son living--2 grandchildren      Had living will--not sure about it   No formal health care POA--would want son   Would accept resuscitation   No prolonged tube feeds if cognitively unaware   Social Drivers of Health   Tobacco Use: Low Risk (10/05/2024)   Patient History    Smoking Tobacco Use: Never    Smokeless Tobacco Use: Never    Passive Exposure: Not on file  Financial Resource Strain: Low Risk  (06/13/2024)   Received from [facility]   Overall  Financial Resource Strain (CARDIA)    Difficulty of Paying Living Expenses: Not very hard  Food Insecurity: Food Insecurity Present (06/13/2024)   Received from [facility]   Epic    Within the past 12 months, you worried that your food would run out before you got the money to buy more.: Sometimes true    Within the past 12 months, the food you bought just didn't last and you didn't have money to get more.: Never true  Transportation Needs: Unmet Transportation Needs (06/13/2024)   Received from [facility] - Transportation    In the past 12 months, has lack of transportation kept you from medical appointments or from getting medications?: Yes    Lack of Transportation (Non-Medical): Yes  Physical Activity: Not on file  Stress: No Stress Concern Present (01/13/2023)   Received from [facility] - Occupational Stress Questionnaire    Feeling of Stress : Not at all  Social Connections: Not on file  Intimate Partner Violence: Not on file  Depression (PHQ2-9): Not on file  Alcohol Screen: Not on file  Housing: Unknown (06/13/2024)   Received from [facility]   Epic    At any time in the past 12 months, were you homeless or living in a shelter (including now)?: No    Number of Times Moved in the Last Year: Not on file    In the last 12 months, was there a time when you were not able to pay the mortgage or rent on time?: No  Utilities: Not At Risk (06/13/2024)   Received from [facility]   Epic    In the past 12 months has the electric, gas, oil, or water  company threatened to shut off services in your home?: No  Health Literacy: Not on file    Past Surgical History:  Procedure Laterality Date   A/V FISTULAGRAM Left 06/03/2023   Procedure: A/V Fistulagram;  Surgeon: Marea Selinda RAMAN, MD;  Location: [facility];  Service: Cardiovascular;  Laterality: Left;   A/V  FISTULAGRAM Left 08/16/2023   Procedure: A/V Fistulagram;  Surgeon: Marea Selinda RAMAN, MD;  Location: [facility];  Service: Cardiovascular;  Laterality: Left;   A/V FISTULAGRAM Left 03/16/2024   Procedure: A/V Fistulagram;  Surgeon: Marea Selinda  S, MD;  Location: [facility];  Service: Cardiovascular;  Laterality: Left;   AV FISTULA PLACEMENT Left 04/14/2023   Procedure: ARTERIOVENOUS (AV) FISTULA CREATION;  Surgeon: Marea Selinda RAMAN, MD;  Location: [facility] ORS;  Service: Vascular;  Laterality: Left;   CATARACT EXTRACTION W/ INTRAOCULAR LENS  IMPLANT, BILATERAL     CHOLECYSTECTOMY     COLONOSCOPY     COLONOSCOPY WITH PROPOFOL  N/A 04/04/2018   Procedure: COLONOSCOPY WITH PROPOFOL ;  Surgeon: Gaylyn Gladis PENNER, MD;  Location: [facility] ENDOSCOPY;  Service: Endoscopy;  Laterality: N/A;   DIALYSIS/PERMA CATHETER INSERTION N/A 01/23/2022   Procedure: DIALYSIS/PERMA CATHETER INSERTION;  Surgeon: Marea Selinda RAMAN, MD;  Location: [facility];  Service: Cardiovascular;  Laterality: N/A;   DIALYSIS/PERMA CATHETER INSERTION N/A 01/28/2023   Procedure: DIALYSIS/PERMA CATHETER INSERTION;  Surgeon: Marea Selinda RAMAN, MD;  Location: [facility];  Service: Cardiovascular;  Laterality: N/A;   DIALYSIS/PERMA CATHETER REMOVAL N/A 06/15/2022   Procedure: DIALYSIS/PERMA CATHETER REMOVAL;  Surgeon: Marea Selinda RAMAN, MD;  Location: [facility];  Service: Cardiovascular;  Laterality: N/A;   DIALYSIS/PERMA CATHETER REMOVAL N/A 07/06/2023   Procedure: DIALYSIS/PERMA CATHETER REMOVAL;  Surgeon: Jama Cordella MATSU, MD;  Location: [facility];  Service: Cardiovascular;  Laterality: N/A;   ESOPHAGOGASTRODUODENOSCOPY (EGD) WITH PROPOFOL  N/A 04/04/2018   Procedure: ESOPHAGOGASTRODUODENOSCOPY (EGD) WITH PROPOFOL ;  Surgeon: Gaylyn Gladis PENNER, MD;  Location: [facility] ENDOSCOPY;  Service: Endoscopy;  Laterality: N/A;   HIATAL HERNIA REPAIR  1990   NISSEN FUNDOPLICATION  2021   PERITONEAL CATHETER INSERTION   04/09/2022   PERITONEAL CATHETER REMOVAL  02/2023   ROUX-EN-Y GASTRIC BYPASS  2021   TONSILLECTOMY AND ADENOIDECTOMY     TUBAL LIGATION     UPPER GASTROINTESTINAL ENDOSCOPY     Uroplasty  ~2000's   Dr Twylla    Family History  Problem Relation Age of Onset   Cancer Mother        lung cancer   Parkinson's disease Father    Dementia Father    COPD Father    Heart disease Father    Cancer Sister        lung cancer   Cancer Son        Ewing's sarcoma   Breast cancer Neg Hx     Allergies[1]     Latest Ref Rng & Units 04/14/2023   10:25 AM 04/12/2023    3:43 PM 01/12/2023   11:43 AM  CBC  WBC 4.0 - 10.5 K/uL  7.3  10.1   Hemoglobin 12.0 - 15.0 g/dL 86.0  86.1  87.9   Hematocrit 36.0 - 46.0 % 41.0  42.9  39.4   Platelets 150 - 400 K/uL  238  392       CMP     Component Value Date/Time   NA 139 04/14/2023 1025   K 4.6 06/03/2023 1237   CL 106 04/14/2023 1025   CO2 22 04/12/2023 1543   GLUCOSE 99 04/14/2023 1025   BUN 46 (H) 04/14/2023 1025   CREATININE 4.80 (H) 04/14/2023 1025   CALCIUM  8.3 (L) 04/12/2023 1543   PROT 7.0 01/21/2022 1036   ALBUMIN 3.4 (L) 01/12/2023 1143   AST 20 01/21/2022 1036   ALT 15 01/21/2022 1036   ALKPHOS 88 01/21/2022 1036   BILITOT 0.7 01/21/2022 1036   GFR 40.72 (L) 01/01/2017 1432   GFRNONAA 25 (L) 04/12/2023 1543     No results found.     Assessment & Plan:  1. ESRD (end stage renal disease) (HCC) (Primary) Recommend:  The patient is experiencing increasing problems with their dialysis access.  Patient should have a fistulagram with the intention for intervention.  The intention for intervention is to restore appropriate flow and prevent thrombosis and possible loss of the access.  As well as improve the quality of dialysis therapy.  The risks, benefits and alternative therapies were reviewed in detail with the patient.  All questions were answered.  The patient agrees to proceed with angio/intervention.    The patient  will follow up with me in the office after the procedure.   2. Type 2 diabetes mellitus with neurological manifestations, controlled (HCC) Continue hypoglycemic medications as already ordered, these medications have been reviewed and there are no changes at this time.  Hgb A1C to be monitored as already arranged by primary service  3. Primary hypertension Continue antihypertensive medications as already ordered, these medications have been reviewed and there are no changes at this time.  Current Outpatient Medications on File Prior to Visit  Medication Sig Dispense Refill   aspirin  EC 81 MG tablet Take 81 mg by mouth at bedtime.     buPROPion  (WELLBUTRIN  XL) 150 MG 24 hr tablet Take 150 mg by mouth at bedtime.     busPIRone  (BUSPAR ) 15 MG tablet Take 15 mg by mouth 3 (three) times daily.     calcium  acetate (PHOSLO ) 667 MG tablet Take 1,334 mg by mouth 3 (three) times daily.     carvedilol (COREG) 6.25 MG tablet Take 6.25 mg by mouth 2 (two) times daily with a meal.     cetirizine  (ZYRTEC ) 10 MG tablet Take 1 tablet (10 mg total) by mouth daily. 90 tablet 3   cholecalciferol (VITAMIN D3) 10 MCG (400 UNIT) TABS tablet Take 2,000 Units by mouth daily.     cyclobenzaprine  (FLEXERIL ) 10 MG tablet Take 1 tablet by mouth 3 (three) times daily as needed.     diphenoxylate -atropine  (LOMOTIL ) 2.5-0.025 MG tablet TAKE 1 TABLET BY MOUTH 4 TIMES DAILY AS NEEDED FOR DIARRHEA OR LOOSE STOOLS. 90 tablet 0   DULoxetine  (CYMBALTA ) 30 MG capsule Take 1 capsule (30 mg total) by mouth daily. 90 capsule 3   famotidine  (PEPCID ) 20 MG tablet Take 20 mg by mouth daily.     furosemide (LASIX) 40 MG tablet Take 40 mg by mouth daily. [id number]     gabapentin  (NEURONTIN ) 100 MG capsule Take 100 mg by mouth at bedtime.     Lifitegrast (XIIDRA) 5 % SOLN Apply to eye.     magnesium  gluconate (MAGONATE) 500 MG tablet Take 500 mg by mouth daily.     midodrine  (PROAMATINE ) 10 MG tablet Take 10 mg by mouth 3 (three)  times daily as needed.     Multiple Vitamins-Minerals (ICAPS) TABS Take 1 tablet by mouth daily.     multivitamin (RENA-VIT) TABS tablet Take 1 tablet by mouth at bedtime.     ondansetron  (ZOFRAN ) 4 MG tablet Take by mouth as needed.     sennosides-docusate sodium (SENOKOT-S) 8.6-50 MG tablet Take 1 tablet by mouth daily as needed. Do not take this med within 2 hours of other meds     silver  sulfADIAZINE  (SILVADENE ) 1 % cream Apply topically continuously as needed     spironolactone (ALDACTONE) 25 MG tablet Take 25 mg by mouth daily.     valsartan (DIOVAN) 160 MG tablet Take 160 mg by mouth daily.     vitamin B-12 (CYANOCOBALAMIN) 500 MCG tablet  Take 500 mcg by mouth daily.     zolpidem  (AMBIEN ) 5 MG tablet Take 5 mg by mouth at bedtime as needed for sleep.     Magnesium  Oxide -Mg Supplement 500 MG TABS Take by mouth. (Patient not taking: Reported on 07/06/2024)     MIEBO 1.338 GM/ML SOLN  (Patient not taking: Reported on 10/05/2024)     potassium chloride  SA (KLOR-CON  M) 20 MEQ tablet Take 2 tablets (40 mEq) today, then 1 tablet (20 mEq) BID tomorrow, then 1 tablet (20 mEq) on the day of surgery. Follow up with PCP/nephrology for repeat labs. (Patient not taking: Reported on 07/06/2024) 5 tablet 0   verapamil  (CALAN ) 40 MG tablet Take 20 mg by mouth as directed. Tuesday, Wednesday, Thursday,Saturday,Sunday (Patient not taking: Reported on 07/06/2024)     Current Facility-Administered Medications on File Prior to Visit  Medication Dose Route Frequency Provider Last Rate Last Admin   lidocaine -EPINEPHrine  (PF) (XYLOCAINE -EPINEPHrine ) 1 %-1:200000 (PF) injection    PRN Dew, Jason S, MD   10 mL at 06/03/23 1345    There are no Patient Instructions on file for this visit. Return for Schedule Fistulogram .   Suetta Hoffmeister E Earle Troiano, NP      [1]  Allergies Allergen Reactions   Cefditoren Pivoxil     Other reaction(s): Other (See Comments) GI issues, severe abdominal pain   Clarithromycin     Other  reaction(s): Other (See Comments) GI issues   Codeine Sulfate Itching   Erythromycin Other (See Comments)    GI issues   Other Other (See Comments)    Oral iron   Pseudoephedrine Other (See Comments)    Altered mental status   Sulfa Antibiotics Itching   Tape     Pt states she can only use silicone tape   "

## 2024-10-16 NOTE — Progress Notes (Signed)
 "  Subjective:    Patient ID: Anita Greene, female    DOB: 1951/09/08, 73 y.o.   MRN: [id number] Chief Complaint  Patient presents with   Follow-up    3 months + HDA     HPI  Discussed the use of AI scribe software for clinical note transcription with the patient, who gave verbal consent to proceed.  History of Present Illness Anita Greene is a 73 year old female who presents for evaluation of her dialysis access.  She experiences occasional bleeding from her dialysis access site, occurring approximately once a month, which she attributes to the manner of needle insertion.  She has experienced clotting at the access site a couple of times, leading to an increase in her heparin  dosage. She is currently undergoing dialysis twice a week, on Mondays and Fridays.  She mentions that she bruises easily, although the last procedure did not result in significant bruising, possibly due to the care taken by the medical staff.    Results DIAGNOSTIC Flow volume measurement: 1241 with multiple stenotic areas noted   Review of Systems  Hematological:  Bruises/bleeds easily.  All other systems reviewed and are negative.      Objective:   Physical Exam Vitals reviewed.  HENT:     Head: Normocephalic.  Cardiovascular:     Rate and Rhythm: Normal rate.     Pulses: Normal pulses.  Pulmonary:     Effort: Pulmonary effort is normal.  Skin:    General: Skin is warm and dry.  Neurological:     Mental Status: She is alert and oriented to person, place, and time.  Psychiatric:        Mood and Affect: Mood normal.        Behavior: Behavior normal.        Thought Content: Thought content normal.        Judgment: Judgment normal.     Physical Exam    BP 101/62   Pulse 71   Resp 17   Ht 5' (1.524 m)   Wt 140 lb 3.2 oz (63.6 kg)   BMI 27.38 kg/m   Past Medical History:  Diagnosis Date   Allergic rhinitis due to pollen    Arthritis    B12 deficiency    Cataract  cortical, senile    Chronic kidney disease    Colon polyp    Depression    End stage chronic kidney disease (HCC)    Fibromyalgia    GERD (gastroesophageal reflux disease)    Hemiparesis affecting right side as late effect of cerebrovascular accident (CVA) (HCC)    Hyperlipidemia    Hypertension    IBS (irritable bowel syndrome)    diarrhea prone   Iron deficiency anemia    Irritable bowel syndrome with diarrhea    Macular degeneration of left eye    Migraine headache    Mood disorder    Obstructive sleep apnea    Optic atrophy of right eye    Osteoporosis    Paraesophageal hernia    PFO (patent foramen ovale)    Pneumonia    RLS (restless legs syndrome)    Secondary hyperparathyroidism, renal    Sleep disturbance    Stroke (HCC)    Type 2 diabetes mellitus with neurological manifestations, controlled (HCC)     Social History   Socioeconomic History   Marital status: Widowed    Spouse name: Not on file   Number of children: 2   Years of  education: Not on file   Highest education level: Not on file  Occupational History   Occupation: [employer]  Tobacco Use   Smoking status: Never   Smokeless tobacco: Never  Vaping Use   Vaping status: Never Used  Substance and Sexual Activity   Alcohol use: Never    Alcohol/week: 0.0 standard drinks of alcohol   Drug use: Never   Sexual activity: Not on file  Other Topics Concern   Not on file  Social History Narrative   1 son died    1 son living--2 grandchildren      Had living will--not sure about it   No formal health care POA--would want son   Would accept resuscitation   No prolonged tube feeds if cognitively unaware   Social Drivers of Health   Tobacco Use: Low Risk (10/05/2024)   Patient History    Smoking Tobacco Use: Never    Smokeless Tobacco Use: Never    Passive Exposure: Not on file  Financial Resource Strain: Low Risk  (06/13/2024)   Received from [facility]   Overall  Financial Resource Strain (CARDIA)    Difficulty of Paying Living Expenses: Not very hard  Food Insecurity: Food Insecurity Present (06/13/2024)   Received from [facility]   Epic    Within the past 12 months, you worried that your food would run out before you got the money to buy more.: Sometimes true    Within the past 12 months, the food you bought just didn't last and you didn't have money to get more.: Never true  Transportation Needs: Unmet Transportation Needs (06/13/2024)   Received from [facility] - Transportation    In the past 12 months, has lack of transportation kept you from medical appointments or from getting medications?: Yes    Lack of Transportation (Non-Medical): Yes  Physical Activity: Not on file  Stress: No Stress Concern Present (01/13/2023)   Received from [facility] - Occupational Stress Questionnaire    Feeling of Stress : Not at all  Social Connections: Not on file  Intimate Partner Violence: Not on file  Depression (PHQ2-9): Not on file  Alcohol Screen: Not on file  Housing: Unknown (06/13/2024)   Received from [facility]   Epic    At any time in the past 12 months, were you homeless or living in a shelter (including now)?: No    Number of Times Moved in the Last Year: Not on file    In the last 12 months, was there a time when you were not able to pay the mortgage or rent on time?: No  Utilities: Not At Risk (06/13/2024)   Received from [facility]   Epic    In the past 12 months has the electric, gas, oil, or water  company threatened to shut off services in your home?: No  Health Literacy: Not on file    Past Surgical History:  Procedure Laterality Date   A/V FISTULAGRAM Left 06/03/2023   Procedure: A/V Fistulagram;  Surgeon: Marea Selinda RAMAN, MD;  Location: [facility];  Service: Cardiovascular;  Laterality: Left;   A/V  FISTULAGRAM Left 08/16/2023   Procedure: A/V Fistulagram;  Surgeon: Marea Selinda RAMAN, MD;  Location: [facility];  Service: Cardiovascular;  Laterality: Left;   A/V FISTULAGRAM Left 03/16/2024   Procedure: A/V Fistulagram;  Surgeon: Marea Selinda  S, MD;  Location: [facility];  Service: Cardiovascular;  Laterality: Left;   AV FISTULA PLACEMENT Left 04/14/2023   Procedure: ARTERIOVENOUS (AV) FISTULA CREATION;  Surgeon: Marea Selinda RAMAN, MD;  Location: [facility] ORS;  Service: Vascular;  Laterality: Left;   CATARACT EXTRACTION W/ INTRAOCULAR LENS  IMPLANT, BILATERAL     CHOLECYSTECTOMY     COLONOSCOPY     COLONOSCOPY WITH PROPOFOL  N/A 04/04/2018   Procedure: COLONOSCOPY WITH PROPOFOL ;  Surgeon: Gaylyn Gladis PENNER, MD;  Location: [facility] ENDOSCOPY;  Service: Endoscopy;  Laterality: N/A;   DIALYSIS/PERMA CATHETER INSERTION N/A 01/23/2022   Procedure: DIALYSIS/PERMA CATHETER INSERTION;  Surgeon: Marea Selinda RAMAN, MD;  Location: [facility];  Service: Cardiovascular;  Laterality: N/A;   DIALYSIS/PERMA CATHETER INSERTION N/A 01/28/2023   Procedure: DIALYSIS/PERMA CATHETER INSERTION;  Surgeon: Marea Selinda RAMAN, MD;  Location: [facility];  Service: Cardiovascular;  Laterality: N/A;   DIALYSIS/PERMA CATHETER REMOVAL N/A 06/15/2022   Procedure: DIALYSIS/PERMA CATHETER REMOVAL;  Surgeon: Marea Selinda RAMAN, MD;  Location: [facility];  Service: Cardiovascular;  Laterality: N/A;   DIALYSIS/PERMA CATHETER REMOVAL N/A 07/06/2023   Procedure: DIALYSIS/PERMA CATHETER REMOVAL;  Surgeon: Jama Cordella MATSU, MD;  Location: [facility];  Service: Cardiovascular;  Laterality: N/A;   ESOPHAGOGASTRODUODENOSCOPY (EGD) WITH PROPOFOL  N/A 04/04/2018   Procedure: ESOPHAGOGASTRODUODENOSCOPY (EGD) WITH PROPOFOL ;  Surgeon: Gaylyn Gladis PENNER, MD;  Location: [facility] ENDOSCOPY;  Service: Endoscopy;  Laterality: N/A;   HIATAL HERNIA REPAIR  1990   NISSEN FUNDOPLICATION  2021   PERITONEAL CATHETER INSERTION   04/09/2022   PERITONEAL CATHETER REMOVAL  02/2023   ROUX-EN-Y GASTRIC BYPASS  2021   TONSILLECTOMY AND ADENOIDECTOMY     TUBAL LIGATION     UPPER GASTROINTESTINAL ENDOSCOPY     Uroplasty  ~2000's   Dr Twylla    Family History  Problem Relation Age of Onset   Cancer Mother        lung cancer   Parkinson's disease Father    Dementia Father    COPD Father    Heart disease Father    Cancer Sister        lung cancer   Cancer Son        Ewing's sarcoma   Breast cancer Neg Hx     Allergies[1]     Latest Ref Rng & Units 04/14/2023   10:25 AM 04/12/2023    3:43 PM 01/12/2023   11:43 AM  CBC  WBC 4.0 - 10.5 K/uL  7.3  10.1   Hemoglobin 12.0 - 15.0 g/dL 86.0  86.1  87.9   Hematocrit 36.0 - 46.0 % 41.0  42.9  39.4   Platelets 150 - 400 K/uL  238  392       CMP     Component Value Date/Time   NA 139 04/14/2023 1025   K 4.6 06/03/2023 1237   CL 106 04/14/2023 1025   CO2 22 04/12/2023 1543   GLUCOSE 99 04/14/2023 1025   BUN 46 (H) 04/14/2023 1025   CREATININE 4.80 (H) 04/14/2023 1025   CALCIUM  8.3 (L) 04/12/2023 1543   PROT 7.0 01/21/2022 1036   ALBUMIN 3.4 (L) 01/12/2023 1143   AST 20 01/21/2022 1036   ALT 15 01/21/2022 1036   ALKPHOS 88 01/21/2022 1036   BILITOT 0.7 01/21/2022 1036   GFR 40.72 (L) 01/01/2017 1432   GFRNONAA 25 (L) 04/12/2023 1543     No results found.     Assessment & Plan:  1. ESRD (end stage renal disease) (HCC) (Primary) Recommend:  The patient is experiencing increasing problems with their dialysis access.  Patient should have a fistulagram with the intention for intervention.  The intention for intervention is to restore appropriate flow and prevent thrombosis and possible loss of the access.  As well as improve the quality of dialysis therapy.  The risks, benefits and alternative therapies were reviewed in detail with the patient.  All questions were answered.  The patient agrees to proceed with angio/intervention.    The patient  will follow up with me in the office after the procedure.   2. Type 2 diabetes mellitus with neurological manifestations, controlled (HCC) Continue hypoglycemic medications as already ordered, these medications have been reviewed and there are no changes at this time.  Hgb A1C to be monitored as already arranged by primary service  3. Primary hypertension Continue antihypertensive medications as already ordered, these medications have been reviewed and there are no changes at this time.  Current Outpatient Medications on File Prior to Visit  Medication Sig Dispense Refill   aspirin  EC 81 MG tablet Take 81 mg by mouth at bedtime.     buPROPion  (WELLBUTRIN  XL) 150 MG 24 hr tablet Take 150 mg by mouth at bedtime.     busPIRone  (BUSPAR ) 15 MG tablet Take 15 mg by mouth 3 (three) times daily.     calcium  acetate (PHOSLO ) 667 MG tablet Take 1,334 mg by mouth 3 (three) times daily.     carvedilol (COREG) 6.25 MG tablet Take 6.25 mg by mouth 2 (two) times daily with a meal.     cetirizine  (ZYRTEC ) 10 MG tablet Take 1 tablet (10 mg total) by mouth daily. 90 tablet 3   cholecalciferol (VITAMIN D3) 10 MCG (400 UNIT) TABS tablet Take 2,000 Units by mouth daily.     cyclobenzaprine  (FLEXERIL ) 10 MG tablet Take 1 tablet by mouth 3 (three) times daily as needed.     diphenoxylate -atropine  (LOMOTIL ) 2.5-0.025 MG tablet TAKE 1 TABLET BY MOUTH 4 TIMES DAILY AS NEEDED FOR DIARRHEA OR LOOSE STOOLS. 90 tablet 0   DULoxetine  (CYMBALTA ) 30 MG capsule Take 1 capsule (30 mg total) by mouth daily. 90 capsule 3   famotidine  (PEPCID ) 20 MG tablet Take 20 mg by mouth daily.     furosemide (LASIX) 40 MG tablet Take 40 mg by mouth daily. [id number]     gabapentin  (NEURONTIN ) 100 MG capsule Take 100 mg by mouth at bedtime.     Lifitegrast (XIIDRA) 5 % SOLN Apply to eye.     magnesium  gluconate (MAGONATE) 500 MG tablet Take 500 mg by mouth daily.     midodrine  (PROAMATINE ) 10 MG tablet Take 10 mg by mouth 3 (three)  times daily as needed.     Multiple Vitamins-Minerals (ICAPS) TABS Take 1 tablet by mouth daily.     multivitamin (RENA-VIT) TABS tablet Take 1 tablet by mouth at bedtime.     ondansetron  (ZOFRAN ) 4 MG tablet Take by mouth as needed.     sennosides-docusate sodium (SENOKOT-S) 8.6-50 MG tablet Take 1 tablet by mouth daily as needed. Do not take this med within 2 hours of other meds     silver  sulfADIAZINE  (SILVADENE ) 1 % cream Apply topically continuously as needed     spironolactone (ALDACTONE) 25 MG tablet Take 25 mg by mouth daily.     valsartan (DIOVAN) 160 MG tablet Take 160 mg by mouth daily.     vitamin B-12 (CYANOCOBALAMIN) 500 MCG tablet  Take 500 mcg by mouth daily.     zolpidem  (AMBIEN ) 5 MG tablet Take 5 mg by mouth at bedtime as needed for sleep.     Magnesium  Oxide -Mg Supplement 500 MG TABS Take by mouth. (Patient not taking: Reported on 07/06/2024)     MIEBO 1.338 GM/ML SOLN  (Patient not taking: Reported on 10/05/2024)     potassium chloride  SA (KLOR-CON  M) 20 MEQ tablet Take 2 tablets (40 mEq) today, then 1 tablet (20 mEq) BID tomorrow, then 1 tablet (20 mEq) on the day of surgery. Follow up with PCP/nephrology for repeat labs. (Patient not taking: Reported on 07/06/2024) 5 tablet 0   verapamil  (CALAN ) 40 MG tablet Take 20 mg by mouth as directed. Tuesday, Wednesday, Thursday,Saturday,Sunday (Patient not taking: Reported on 07/06/2024)     Current Facility-Administered Medications on File Prior to Visit  Medication Dose Route Frequency Provider Last Rate Last Admin   lidocaine -EPINEPHrine  (PF) (XYLOCAINE -EPINEPHrine ) 1 %-1:200000 (PF) injection    PRN Dew, Jason S, MD   10 mL at 06/03/23 1345    There are no Patient Instructions on file for this visit. Return for Schedule Fistulogram .   Suetta Hoffmeister E Earle Troiano, NP      [1]  Allergies Allergen Reactions   Cefditoren Pivoxil     Other reaction(s): Other (See Comments) GI issues, severe abdominal pain   Clarithromycin     Other  reaction(s): Other (See Comments) GI issues   Codeine Sulfate Itching   Erythromycin Other (See Comments)    GI issues   Other Other (See Comments)    Oral iron   Pseudoephedrine Other (See Comments)    Altered mental status   Sulfa Antibiotics Itching   Tape     Pt states she can only use silicone tape   "

## 2024-10-18 ENCOUNTER — Telehealth (INDEPENDENT_AMBULATORY_CARE_PROVIDER_SITE_OTHER): Payer: Self-pay

## 2024-10-18 NOTE — Telephone Encounter (Signed)
 Spoke with the patient and she is scheduled with Dr. Marea for a left arm fistulagram on 11/02/24 with a 11:00 am arrival time to the [facility]. Pre-procedure instructions were discussed and will be sent to Mychart and mailed.

## 2024-11-02 ENCOUNTER — Telehealth (INDEPENDENT_AMBULATORY_CARE_PROVIDER_SITE_OTHER): Payer: Self-pay | Admitting: Vascular Surgery

## 2024-11-02 ENCOUNTER — Other Ambulatory Visit: Payer: Self-pay

## 2024-11-02 ENCOUNTER — Encounter
Admission: RE | Disposition: A | Discharge status: Home / Self Care | Payer: Self-pay | Source: Home / Self Care | Attending: Vascular Surgery

## 2024-11-02 ENCOUNTER — Encounter: Payer: Self-pay | Admitting: Vascular Surgery

## 2024-11-02 ENCOUNTER — Ambulatory Visit
Admission: RE | Admit: 2024-11-02 | Discharge: 2024-11-02 | Disposition: A | Discharge status: Home / Self Care | Payer: Medicare (Managed Care) | Attending: Vascular Surgery | Admitting: Vascular Surgery

## 2024-11-02 DIAGNOSIS — I120 Hypertensive chronic kidney disease with stage 5 chronic kidney disease or end stage renal disease: Secondary | ICD-10-CM | POA: Insufficient documentation

## 2024-11-02 DIAGNOSIS — Z7984 Long term (current) use of oral hypoglycemic drugs: Secondary | ICD-10-CM | POA: Diagnosis not present

## 2024-11-02 DIAGNOSIS — I871 Compression of vein: Secondary | ICD-10-CM | POA: Diagnosis not present

## 2024-11-02 DIAGNOSIS — Z79899 Other long term (current) drug therapy: Secondary | ICD-10-CM | POA: Diagnosis not present

## 2024-11-02 DIAGNOSIS — Y832 Surgical operation with anastomosis, bypass or graft as the cause of abnormal reaction of the patient, or of later complication, without mention of misadventure at the time of the procedure: Secondary | ICD-10-CM | POA: Diagnosis not present

## 2024-11-02 DIAGNOSIS — Z992 Dependence on renal dialysis: Secondary | ICD-10-CM | POA: Diagnosis not present

## 2024-11-02 DIAGNOSIS — N186 End stage renal disease: Secondary | ICD-10-CM | POA: Diagnosis present

## 2024-11-02 DIAGNOSIS — E1122 Type 2 diabetes mellitus with diabetic chronic kidney disease: Secondary | ICD-10-CM | POA: Diagnosis not present

## 2024-11-02 DIAGNOSIS — Z5941 Food insecurity: Secondary | ICD-10-CM | POA: Insufficient documentation

## 2024-11-02 DIAGNOSIS — Z634 Disappearance and death of family member: Secondary | ICD-10-CM | POA: Insufficient documentation

## 2024-11-02 DIAGNOSIS — T82858A Stenosis of vascular prosthetic devices, implants and grafts, initial encounter: Secondary | ICD-10-CM | POA: Insufficient documentation

## 2024-11-02 HISTORY — PX: A/V FISTULAGRAM: CATH118298

## 2024-11-02 LAB — POTASSIUM (ARMC VASCULAR LAB ONLY): Potassium (ARMC vascular lab): 3.2 mmol/L — ABNORMAL LOW (ref 3.5–5.1)

## 2024-11-02 SURGERY — A/V FISTULAGRAM
Anesthesia: Moderate Sedation | Laterality: Left

## 2024-11-02 MED ORDER — MIDAZOLAM HCL 2 MG/2ML IJ SOLN
INTRAMUSCULAR | Status: AC
  Filled 2024-11-02: qty 2

## 2024-11-02 MED ORDER — VANCOMYCIN HCL 1500 MG/300ML IV SOLN
1500.0000 mg | INTRAVENOUS | Status: AC
  Administered 2024-11-02: 1500 mg via INTRAVENOUS
  Filled 2024-11-02: qty 300

## 2024-11-02 MED ORDER — ONDANSETRON HCL 4 MG/2ML IJ SOLN: 4.0000 mg | Freq: Four times a day (QID) | INTRAMUSCULAR | Status: DC | PRN

## 2024-11-02 MED ORDER — HEPARIN (PORCINE) IN NACL 1000-0.9 UT/500ML-% IV SOLN
INTRAVENOUS | Status: DC | PRN
  Administered 2024-11-02: 500 mL

## 2024-11-02 MED ORDER — MIDAZOLAM HCL 2 MG/ML PO SYRP: 8.0000 mg | ORAL_SOLUTION | Freq: Once | ORAL | Status: DC | PRN

## 2024-11-02 MED ORDER — FENTANYL CITRATE (PF) 50 MCG/ML IJ SOSY
PREFILLED_SYRINGE | INTRAMUSCULAR | Status: AC
  Filled 2024-11-02: qty 1

## 2024-11-02 MED ORDER — SODIUM CHLORIDE 0.9 % IV SOLN: INTRAVENOUS | Status: DC

## 2024-11-02 MED ORDER — MIDAZOLAM HCL (PF) 2 MG/2ML IJ SOLN
INTRAMUSCULAR | Status: DC | PRN
  Administered 2024-11-02: 2 mg via INTRAVENOUS

## 2024-11-02 MED ORDER — LIDOCAINE-EPINEPHRINE (PF) 1 %-1:200000 IJ SOLN
INTRAMUSCULAR | Status: DC | PRN
  Administered 2024-11-02: 10 mL

## 2024-11-02 MED ORDER — METHYLPREDNISOLONE SODIUM SUCC 125 MG IJ SOLR: 125.0000 mg | Freq: Once | INTRAMUSCULAR | Status: DC | PRN

## 2024-11-02 MED ORDER — HYDROMORPHONE HCL 1 MG/ML IJ SOLN: 1.0000 mg | Freq: Once | INTRAMUSCULAR | Status: DC | PRN

## 2024-11-02 MED ORDER — FENTANYL CITRATE (PF) 100 MCG/2ML IJ SOLN
INTRAMUSCULAR | Status: DC | PRN
  Administered 2024-11-02: 50 ug via INTRAVENOUS

## 2024-11-02 MED ORDER — HEPARIN SODIUM (PORCINE) 1000 UNIT/ML IJ SOLN
INTRAMUSCULAR | Status: DC | PRN
  Administered 2024-11-02: 3000 [IU] via INTRAVENOUS

## 2024-11-02 MED ORDER — HEPARIN SODIUM (PORCINE) 1000 UNIT/ML IJ SOLN
INTRAMUSCULAR | Status: AC
  Filled 2024-11-02: qty 10

## 2024-11-02 MED ORDER — FAMOTIDINE 20 MG PO TABS: 40.0000 mg | ORAL_TABLET | Freq: Once | ORAL | Status: DC | PRN

## 2024-11-02 MED ORDER — VANCOMYCIN HCL IN DEXTROSE 1-5 GM/200ML-% IV SOLN
INTRAVENOUS | Status: AC
  Filled 2024-11-02: qty 200

## 2024-11-02 MED ORDER — DIPHENHYDRAMINE HCL 50 MG/ML IJ SOLN: 50.0000 mg | Freq: Once | INTRAMUSCULAR | Status: DC | PRN

## 2024-11-02 MED ORDER — IODIXANOL 320 MG/ML IV SOLN
INTRAVENOUS | Status: DC | PRN
  Administered 2024-11-02: 25 mL

## 2024-11-02 SURGICAL SUPPLY — 11 items
BALLOON LUTONIX AV 8X60X75 (BALLOONS) IMPLANT
BALLOON ULTRVRSE 10X40X75 (BALLOONS) IMPLANT
COVER PROBE ULTRASOUND 5X96 (MISCELLANEOUS) IMPLANT
DEVICE PRESTO INFLATION (MISCELLANEOUS) IMPLANT
DRAPE BRACHIAL (DRAPES) IMPLANT
KIT MICROPUNCTURE VSI 5F STIFF (SHEATH) IMPLANT
NEEDLE ENTRY 21GA 7CM ECHOTIP (NEEDLE) IMPLANT
PACK ANGIOGRAPHY (CUSTOM PROCEDURE TRAY) ×1 IMPLANT
SHEATH BRITE TIP 6FRX5.5 (SHEATH) IMPLANT
SUT MNCRL AB 4-0 PS2 18 (SUTURE) IMPLANT
WIRE SUPRACORE 190CM (WIRE) IMPLANT

## 2024-11-02 NOTE — Telephone Encounter (Signed)
 Called pt to schedule post op. Patient stated she would call back later to schedule post op appointment.   Follow up with Marea Selinda RAMAN, MD (Vascular Surgery) in 6 weeks (12/14/2024); with HDA.

## 2024-11-02 NOTE — Interval H&P Note (Signed)
 History and Physical Interval Note:  11/02/2024 10:32 AM  Anita Greene  has presented today for surgery, with the diagnosis of L Arm Fistulagram   End Stage Renal.  The various methods of treatment have been discussed with the patient and family. After consideration of risks, benefits and other options for treatment, the patient has consented to  Procedures: A/V Fistulagram (Left) as a surgical intervention.  The patient's history has been reviewed, patient examined, no change in status, stable for surgery.  I have reviewed the patient's chart and labs.  Questions were answered to the patient's satisfaction.     Santa Abdelrahman

## 2024-11-02 NOTE — Op Note (Signed)
 [place] [facility]    OPERATIVE NOTE   PROCEDURE: 1.   Left brachiocephalic arteriovenous fistula cannulation under ultrasound guidance 2.   Left arm fistulagram including central venogram 3.   Percutaneous transluminal angioplasty of the left cephalic vein subclavian vein confluence with 8 mm diameter by 6 cm length angioplasty balloon 4.   Percutaneous transluminal angioplasty of the mid upper arm cephalic vein with 10 mm diameter by 4 cm length angioplasty balloon  PRE-OPERATIVE DIAGNOSIS: 1. ESRD 2. Poorly functional left brachiocephalic AVF  POST-OPERATIVE DIAGNOSIS: same as above   SURGEON: Selinda Gu, MD  ANESTHESIA: local with MCS  ESTIMATED BLOOD LOSS: 3 cc  FINDING(S): Retained valve stenosis in the mid upper arm cephalic vein creating a napkin ring like 70 to 80% stenosis.  There was then hyperplasia at the most proximal edge of the previously placed stent at the cephalic vein subclavian vein confluence creating another 70 to 80% stenosis.  SPECIMEN(S):  None  CONTRAST: 25 cc  FLUORO TIME: 1.6 minutes  MODERATE CONSCIOUS SEDATION TIME: Approximately 23 minutes with 2 mg of Versed  and 50 mcg of Fentanyl    INDICATIONS: Anita Greene is a 74 y.o. female who presents with malfunctioning left brachiocephalic arteriovenous fistula.  The patient is scheduled for left arm fistulagram.  The patient is aware the risks include but are not limited to: bleeding, infection, thrombosis of the cannulated access, and possible anaphylactic reaction to the contrast.  The patient is aware of the risks of the procedure and elects to proceed forward.  DESCRIPTION: After full informed written consent was obtained, the patient was brought back to the angiography suite and placed supine upon the angiography table.  The patient was connected to monitoring equipment. Moderate conscious sedation was administered with a face to face encounter with the patient throughout the  procedure with my supervision of the RN administering medicines and monitoring the patient's vital signs and mental status throughout from the start of the procedure until the patient was taken to the recovery room. The left arm was prepped and draped in the standard fashion for a percutaneous access intervention.  Under ultrasound guidance, the left brachiocephalic arteriovenous fistula was cannulated with a micropuncture needle under direct ultrasound guidance where it was patent in the distal upper arm cephalic vein and a permanent image was performed.  The microwire was advanced into the fistula and the needle was exchanged for the a microsheath.  I then upsized to a 6 Fr Sheath and imaging was performed.  Hand injections were completed to image the access including the central venous system. This demonstrated Retained valve stenosis in the mid upper arm cephalic vein creating a napkin ring like 70 to 80% stenosis.  There was then hyperplasia at the most proximal edge of the previously placed stent at the cephalic vein subclavian vein confluence creating another 70 to 80% stenosis. Based on the images, this patient will need intervention to both these areas of stenosis. I then gave the patient 3000 units of intravenous heparin .  I then crossed the stenoses with a supra core wire.  Based on the imaging, a 8 mm x 6 cm   angioplasty balloon was selected for the stenosis at the cephalic vein subclavian vein confluence.  The balloon was centered around the cephalic vein subclavian vein confluence stenosis and inflated to 10 ATM for 1 minute(s).  On completion imaging, a 10-15% residual stenosis was present.   I then selected a 10 mm diameter by 4 cm  length angioplasty balloon for the mid upper arm cephalic vein retained valve stenosis.  This was inflated in this location and taken up to 6 atm for 1 minute.  Completion imaging showed marked improvement with what appeared to be less than a 20 to 25% residual  stenosis.  Based on the completion imaging, no further intervention is necessary.  The wire and balloon were removed from the sheath.  A 4-0 Monocryl purse-string suture was sewn around the sheath.  The sheath was removed while tying down the suture.  A sterile bandage was applied to the puncture site.  COMPLICATIONS: None  CONDITION: Stable   Selinda Gu  11/02/2024 11:32 AM   This note was created with Dragon Medical transcription system. Any errors in dictation are purely unintentional.
